# Patient Record
Sex: Female | Born: 1985 | Race: Black or African American | Hispanic: No | Marital: Married | Smoking: Never smoker
Health system: Southern US, Community
[De-identification: ages and names within clinical notes are randomized; demographics above are authoritative.]

## PROBLEM LIST (undated history)

## (undated) ENCOUNTER — Inpatient Hospital Stay (HOSPITAL_COMMUNITY): Payer: Self-pay

## (undated) ENCOUNTER — Inpatient Hospital Stay: Payer: Self-pay

## (undated) DIAGNOSIS — J45909 Unspecified asthma, uncomplicated: Secondary | ICD-10-CM

## (undated) DIAGNOSIS — F32A Depression, unspecified: Secondary | ICD-10-CM

## (undated) DIAGNOSIS — R519 Headache, unspecified: Secondary | ICD-10-CM

## (undated) DIAGNOSIS — G43909 Migraine, unspecified, not intractable, without status migrainosus: Secondary | ICD-10-CM

## (undated) DIAGNOSIS — O24419 Gestational diabetes mellitus in pregnancy, unspecified control: Secondary | ICD-10-CM

## (undated) DIAGNOSIS — D649 Anemia, unspecified: Secondary | ICD-10-CM

## (undated) DIAGNOSIS — R51 Headache: Secondary | ICD-10-CM

## (undated) DIAGNOSIS — Z98891 History of uterine scar from previous surgery: Secondary | ICD-10-CM

## (undated) DIAGNOSIS — F329 Major depressive disorder, single episode, unspecified: Secondary | ICD-10-CM

## (undated) HISTORY — DX: Unspecified asthma, uncomplicated: J45.909

## (undated) HISTORY — PX: DILATION AND CURETTAGE OF UTERUS: SHX78

## (undated) HISTORY — DX: Migraine, unspecified, not intractable, without status migrainosus: G43.909

## (undated) HISTORY — DX: Gestational diabetes mellitus in pregnancy, unspecified control: O24.419

## (undated) HISTORY — DX: History of uterine scar from previous surgery: Z98.891

---

## 2007-12-22 DIAGNOSIS — IMO0001 Reserved for inherently not codable concepts without codable children: Secondary | ICD-10-CM

## 2015-07-16 ENCOUNTER — Other Ambulatory Visit (HOSPITAL_COMMUNITY): Payer: Self-pay | Admitting: *Deleted

## 2015-07-16 DIAGNOSIS — N644 Mastodynia: Secondary | ICD-10-CM

## 2015-07-25 ENCOUNTER — Telehealth (HOSPITAL_COMMUNITY): Payer: Self-pay | Admitting: *Deleted

## 2015-07-25 NOTE — Telephone Encounter (Signed)
Telephoned patient at home # to remind of [facility] appointment. Voicemail box not set up.

## 2015-07-26 ENCOUNTER — Other Ambulatory Visit: Payer: Self-pay

## 2015-07-26 ENCOUNTER — Ambulatory Visit (HOSPITAL_COMMUNITY): Payer: Self-pay

## 2015-08-01 ENCOUNTER — Emergency Department
Admission: EM | Admit: 2015-08-01 | Discharge: 2015-08-01 | Disposition: A | Discharge status: Home / Self Care | Payer: Self-pay | Attending: Emergency Medicine | Admitting: Emergency Medicine

## 2015-08-01 ENCOUNTER — Emergency Department: Payer: Self-pay

## 2015-08-01 ENCOUNTER — Encounter: Payer: Self-pay | Admitting: *Deleted

## 2015-08-01 DIAGNOSIS — Z3490 Encounter for supervision of normal pregnancy, unspecified, unspecified trimester: Secondary | ICD-10-CM

## 2015-08-01 DIAGNOSIS — Z331 Pregnant state, incidental: Secondary | ICD-10-CM | POA: Insufficient documentation

## 2015-08-01 DIAGNOSIS — F329 Major depressive disorder, single episode, unspecified: Secondary | ICD-10-CM | POA: Insufficient documentation

## 2015-08-01 DIAGNOSIS — R1011 Right upper quadrant pain: Secondary | ICD-10-CM | POA: Insufficient documentation

## 2015-08-01 HISTORY — DX: Major depressive disorder, single episode, unspecified: F32.9

## 2015-08-01 HISTORY — DX: Depression, unspecified: F32.A

## 2015-08-01 LAB — URINALYSIS COMPLETE WITH MICROSCOPIC (ARMC ONLY)
Bacteria, UA: NONE SEEN
Bilirubin Urine: NEGATIVE
GLUCOSE, UA: NEGATIVE mg/dL
Hgb urine dipstick: NEGATIVE
KETONES UR: NEGATIVE mg/dL
Leukocytes, UA: NEGATIVE
Nitrite: NEGATIVE
PROTEIN: NEGATIVE mg/dL
Specific Gravity, Urine: 1.009 (ref 1.005–1.030)
pH: 6 (ref 5.0–8.0)

## 2015-08-01 LAB — COMPREHENSIVE METABOLIC PANEL
ALBUMIN: 4.4 g/dL (ref 3.5–5.0)
ALK PHOS: 37 U/L — AB (ref 38–126)
ALT: 23 U/L (ref 14–54)
AST: 25 U/L (ref 15–41)
Anion gap: 8 (ref 5–15)
BUN: 10 mg/dL (ref 6–20)
CALCIUM: 9.3 mg/dL (ref 8.9–10.3)
CHLORIDE: 104 mmol/L (ref 101–111)
CO2: 21 mmol/L — AB (ref 22–32)
CREATININE: 0.78 mg/dL (ref 0.44–1.00)
GFR calc Af Amer: >60 mL/min (ref >60)
GFR calc non Af Amer: >60 mL/min (ref >60)
GLUCOSE: 88 mg/dL (ref 65–99)
Potassium: 4.1 mmol/L (ref 3.5–5.1)
SODIUM: 133 mmol/L — AB (ref 135–145)
Total Bilirubin: 0.9 mg/dL (ref 0.3–1.2)
Total Protein: 7.5 g/dL (ref 6.5–8.1)

## 2015-08-01 LAB — CBC
HCT: 41.3 % (ref 35.0–47.0)
Hemoglobin: 14.4 g/dL (ref 12.0–16.0)
MCH: 29.1 pg (ref 26.0–34.0)
MCHC: 34.7 g/dL (ref 32.0–36.0)
MCV: 83.8 fL (ref 80.0–100.0)
PLATELETS: 204 10*3/uL (ref 150–440)
RBC: 4.93 MIL/uL (ref 3.80–5.20)
RDW: 13.9 % (ref 11.5–14.5)
WBC: 7.7 10*3/uL (ref 3.6–11.0)

## 2015-08-01 LAB — LIPASE, BLOOD: LIPASE: 16 U/L (ref 11–51)

## 2015-08-01 MED ORDER — DIPHENHYDRAMINE HCL 50 MG/ML IJ SOLN
50.0000 mg | Freq: Once | INTRAMUSCULAR | Status: AC
  Administered 2015-08-01: 50 mg via INTRAVENOUS
  Filled 2015-08-01: qty 1

## 2015-08-01 MED ORDER — PRENATAL VITAMINS 0.8 MG PO TABS: 1.0000 | ORAL_TABLET | Freq: Every day | ORAL | Status: DC

## 2015-08-01 MED ORDER — METOCLOPRAMIDE HCL 5 MG/ML IJ SOLN
10.0000 mg | Freq: Once | INTRAMUSCULAR | Status: AC
  Administered 2015-08-01: 10 mg via INTRAVENOUS
  Filled 2015-08-01 (×2): qty 2

## 2015-08-01 MED ORDER — RANITIDINE HCL 150 MG PO CAPS: 150.0000 mg | ORAL_CAPSULE | Freq: Two times a day (BID) | ORAL | Status: DC

## 2015-08-01 MED ORDER — DEXTROSE 5 % AND 0.9 % NACL IV BOLUS
1000.0000 mL | Freq: Once | INTRAVENOUS | Status: AC
  Administered 2015-08-01: 1000 mL via INTRAVENOUS
  Filled 2015-08-01: qty 1000

## 2015-08-01 MED ORDER — FAMOTIDINE IN NACL 20-0.9 MG/50ML-% IV SOLN
20.0000 mg | Freq: Once | INTRAVENOUS | Status: AC
  Administered 2015-08-01: 20 mg via INTRAVENOUS
  Filled 2015-08-01: qty 50

## 2015-08-01 MED ORDER — METOCLOPRAMIDE HCL 10 MG PO TABS: 10.0000 mg | ORAL_TABLET | Freq: Three times a day (TID) | ORAL | Status: DC

## 2015-08-01 NOTE — ED Provider Notes (Signed)
[facility] Emergency Department Provider Note  ____________________________________________  Time seen: 4:30 PM  I have reviewed the triage vital signs and the nursing notes.   HISTORY  Chief Complaint Emesis and Dizziness    HPI Janet [name] is a 30 y.o. female who complains of vomiting for the past 3 days. She's also been having generalized abdominal achiness over the past month which is waxing and waning nonradiating. No aggravating or alleviating factors. She also notes that she's had dizziness described as vertiginous symptoms worse with standing for the last 3 weeks. She said decreased oral intake over the last month as well. I asked menstrual period was June 1 but she also notes that that was only spotting for 3 days which is much lighter than usual and that her last normal menstrual period was the beginning of May.Pain is currently moderate in intensity, achy.     Past Medical History  Diagnosis Date  . Depression      There are no active problems to display for this patient.    History reviewed. No pertinent past surgical history.   Current Outpatient Rx  Name  Route  Sig  Dispense  Refill  . metoCLOPramide (REGLAN) 10 MG tablet   Oral   Take 1 tablet (10 mg total) by mouth 4 (four) times daily -  before meals and at bedtime.   60 tablet   0   . ranitidine (ZANTAC) 150 MG capsule   Oral   Take 1 capsule (150 mg total) by mouth 2 (two) times daily.   28 capsule   0    None  Allergies Levaquin and Nsaids   History reviewed. No pertinent family history.  Social History Social History  Substance Use Topics  . Smoking status: Unknown If Ever Smoked  . Smokeless tobacco: None  . Alcohol Use: None    Review of Systems  Constitutional:   No fever or chills.  ENT:   No sore throat. No rhinorrhea. Cardiovascular:   No chest pain. Respiratory:   No dyspnea or cough. Gastrointestinal:   Poss abdominal pain with vomiting. No  constipation or diarrhea..  Genitourinary:   Negative for dysuria or difficulty urinating. Musculoskeletal:   Negative for focal pain or swelling Neurological:   Negative for headaches. Positive for dizziness 10-point ROS otherwise negative.  ____________________________________________   PHYSICAL EXAM:  VITAL SIGNS: ED Triage Vitals  Enc Vitals Group     BP 08/01/15 1434 127/81 mmHg     Pulse Rate 08/01/15 1434 79     Resp 08/01/15 1434 18     Temp 08/01/15 1434 98.9 F (37.2 C)     Temp Source 08/01/15 1434 Oral     SpO2 08/01/15 1434 100 %     Weight 08/01/15 1434 210 lb (95.255 kg)     Height 08/01/15 1434 5\' 10"  (1.778 m)     Head Cir --      Peak Flow --      Pain Score 08/01/15 1435 8     Pain Loc --      Pain Edu? --      Excl. in GC? --     Vital signs reviewed, nursing assessments reviewed.   Constitutional:   Alert and oriented. Well appearing and in no distress. Eyes:   No scleral icterus. No conjunctival pallor. PERRL. EOMI.  No nystagmus. ENT   Head:   Normocephalic and atraumatic.   Nose:   No congestion/rhinnorhea. No septal hematoma  Mouth/Throat:   MMM, no pharyngeal erythema. No peritonsillar mass.    Neck:   No stridor. No SubQ emphysema. No meningismus. Hematological/Lymphatic/Immunilogical:   No cervical lymphadenopathy. Cardiovascular:   RRR. Symmetric bilateral radial and DP pulses.  No murmurs.  Respiratory:   Normal respiratory effort without tachypnea nor retractions. Breath sounds are clear and equal bilaterally. No wheezes/rales/rhonchi. Gastrointestinal:   Soft with diffuse upper abdominal tenderness. Non distended. There is no CVA tenderness.  No rebound, rigidity, or guarding. Genitourinary:   deferred Musculoskeletal:   Nontender with normal range of motion in all extremities. No joint effusions.  No lower extremity tenderness.  No edema. Neurologic:   Normal speech and language.  CN 2-10 normal. Motor grossly intact. No  gross focal neurologic deficits are appreciated.  Skin:    Skin is warm, dry and intact. No rash noted.  No petechiae, purpura, or bullae.  ____________________________________________    LABS (pertinent positives/negatives) (all labs ordered are listed, but only abnormal results are displayed) Labs Reviewed  COMPREHENSIVE METABOLIC PANEL - Abnormal; Notable for the following:    Sodium 133 (*)    CO2 21 (*)    Alkaline Phosphatase 37 (*)    All other components within normal limits  URINALYSIS COMPLETEWITH MICROSCOPIC ([facility] ONLY) - Abnormal; Notable for the following:    Color, Urine YELLOW (*)    APPearance CLEAR (*)    Squamous Epithelial / LPF 0-5 (*)    All other components within normal limits  LIPASE, BLOOD  CBC  POC URINE PREG, ED   ____________________________________________   EKG  Interpreted by me  Date: 08/01/2015  Rate: 63  Rhythm: normal sinus rhythm  QRS Axis: normal  Intervals: normal  ST/T Wave abnormalities: normal  Conduction Disutrbances: none  Narrative Interpretation: unremarkable      ____________________________________________    RADIOLOGY Ultrasound right upper quadrant unremarkable CT head unremarkable  ____________________________________________   PROCEDURES   ____________________________________________   INITIAL IMPRESSION / ASSESSMENT AND PLAN / ED COURSE  Pertinent labs & imaging results that were available during my care of the patient were reviewed by me and considered in my medical decision making (see chart for details).  Patient not in any distress but uncomfortable appearing. Labs are unremarkable. Urinalysis clean but urine pregnancy test is positive. By dates the patient is either 7 weeks 0 days or possibly up to 11 weeks. No vaginal bleeding, normal vital signs, no indication for an emergent ultrasound here in the emergency department. I discussed this with the patient and advised that she follow-up with  obstetrics and start taking prenatal vitamins. Usual prenatal counseling. Because of the 3 weeks of constant oozing is, we will proceed with CT scan of the brain. Risks and benefits were discussed with her. Likely this is due to some dehydration from poor oral intake in the setting of morning sickness.   ----------------------------------------- 8:45 PM on 08/01/2015 -----------------------------------------  Workup negative. With IV fluids and rest, patient is feeling much better. Symptoms have essentially resolved, tolerating oral intake. Vital signs stable will discharge home, given follow-up information for OB/GYN or health department, which ever the patient is able to arrange. Advised to take prenatal vitamins.    ____________________________________________   FINAL CLINICAL IMPRESSION(S) / ED DIAGNOSES  Final diagnoses:  Pregnancy       Portions of this note were generated with dragon dictation software. Dictation errors may occur despite best attempts at proofreading.   Sharman Cheek, MD 08/01/15 2045

## 2015-08-01 NOTE — ED Notes (Signed)
Pt taken to bathroom via stretcher.

## 2015-08-01 NOTE — ED Notes (Signed)
Dr. Stafford at bedside.  

## 2015-08-01 NOTE — ED Notes (Signed)
Pt stated she has to urinate, and wanted to urinate before going over D/C paperwork. Ambulatory to toilet with steady gait and no assistance.

## 2015-08-01 NOTE — ED Notes (Signed)
States vomiting and dizziness with upper abd pain for 3 weeks, pt states she has not been sleeping, pt awake and alert

## 2015-08-01 NOTE — Discharge Instructions (Signed)
First Trimester of Pregnancy °The first trimester of pregnancy is from week 1 until the end of week 12 (months 1 through 3). A week after a sperm fertilizes an egg, the egg will implant on the wall of the uterus. This embryo will begin to develop into a baby. Genes from you and your partner are forming the baby. The female genes determine whether the baby is a boy or a girl. At 6-8 weeks, the eyes and face are formed, and the heartbeat can be seen on ultrasound. At the end of 12 weeks, all the baby's organs are formed.  °Now that you are pregnant, you will want to do everything you can to have a healthy baby. Two of the most important things are to get good prenatal care and to follow your health care provider's instructions. Prenatal care is all the medical care you receive before the baby's birth. This care will help prevent, find, and treat any problems during the pregnancy and childbirth. °BODY CHANGES °Your body goes through many changes during pregnancy. The changes vary from woman to woman.  °· You may gain or lose a couple of pounds at first. °· You may feel sick to your stomach (nauseous) and throw up (vomit). If the vomiting is uncontrollable, call your health care provider. °· You may tire easily. °· You may develop headaches that can be relieved by medicines approved by your health care provider. °· You may urinate more often. Painful urination may mean you have a bladder infection. °· You may develop heartburn as a result of your pregnancy. °· You may develop constipation because certain hormones are causing the muscles that push waste through your intestines to slow down. °· You may develop hemorrhoids or swollen, bulging veins (varicose veins). °· Your breasts may begin to grow larger and become tender. Your nipples may stick out more, and the tissue that surrounds them (areola) may become darker. °· Your gums may bleed and may be sensitive to brushing and flossing. °· Dark spots or blotches (chloasma,  mask of pregnancy) may develop on your face. This will likely fade after the baby is born. °· Your menstrual periods will stop. °· You may have a loss of appetite. °· You may develop cravings for certain kinds of food. °· You may have changes in your emotions from day to day, such as being excited to be pregnant or being concerned that something may go wrong with the pregnancy and baby. °· You may have more vivid and strange dreams. °· You may have changes in your hair. These can include thickening of your hair, rapid growth, and changes in texture. Some women also have hair loss during or after pregnancy, or hair that feels dry or thin. Your hair will most likely return to normal after your baby is born. °WHAT TO EXPECT AT YOUR PRENATAL VISITS °During a routine prenatal visit: °· You will be weighed to make sure you and the baby are growing normally. °· Your blood pressure will be taken. °· Your abdomen will be measured to track your baby's growth. °· The fetal heartbeat will be listened to starting around week 10 or 12 of your pregnancy. °· Test results from any previous visits will be discussed. °Your health care provider may ask you: °· How you are feeling. °· If you are feeling the baby move. °· If you have had any abnormal symptoms, such as leaking fluid, bleeding, severe headaches, or abdominal cramping. °· If you are using any tobacco products,   including cigarettes, chewing tobacco, and electronic cigarettes. °· If you have any questions. °Other tests that may be performed during your first trimester include: °· Blood tests to find your blood type and to check for the presence of any previous infections. They will also be used to check for low iron levels (anemia) and Rh antibodies. Later in the pregnancy, blood tests for diabetes will be done along with other tests if problems develop. °· Urine tests to check for infections, diabetes, or protein in the urine. °· An ultrasound to confirm the proper growth  and development of the baby. °· An amniocentesis to check for possible genetic problems. °· Fetal screens for spina bifida and Down syndrome. °· You may need other tests to make sure you and the baby are doing well. °· HIV (human immunodeficiency virus) testing. Routine prenatal testing includes screening for HIV, unless you choose not to have this test. °HOME CARE INSTRUCTIONS  °Medicines °· Follow your health care provider's instructions regarding medicine use. Specific medicines may be either safe or unsafe to take during pregnancy. °· Take your prenatal vitamins as directed. °· If you develop constipation, try taking a stool softener if your health care provider approves. °Diet °· Eat regular, well-balanced meals. Choose a variety of foods, such as meat or vegetable-based protein, fish, milk and low-fat dairy products, vegetables, fruits, and whole grain breads and cereals. Your health care provider will help you determine the amount of weight gain that is right for you. °· Avoid raw meat and uncooked cheese. These carry germs that can cause birth defects in the baby. °· Eating four or five small meals rather than three large meals a day may help relieve nausea and vomiting. If you start to feel nauseous, eating a few soda crackers can be helpful. Drinking liquids between meals instead of during meals also seems to help nausea and vomiting. °· If you develop constipation, eat more high-fiber foods, such as fresh vegetables or fruit and whole grains. Drink enough fluids to keep your urine clear or pale yellow. °Activity and Exercise °· Exercise only as directed by your health care provider. Exercising will help you: °¨ Control your weight. °¨ Stay in shape. °¨ Be prepared for labor and delivery. °· Experiencing pain or cramping in the lower abdomen or low back is a good sign that you should stop exercising. Check with your health care provider before continuing normal exercises. °· Try to avoid standing for long  periods of time. Move your legs often if you must stand in one place for a long time. °· Avoid heavy lifting. °· Wear low-heeled shoes, and practice good posture. °· You may continue to have sex unless your health care provider directs you otherwise. °Relief of Pain or Discomfort °· Wear a good support bra for breast tenderness.   °· Take warm sitz baths to soothe any pain or discomfort caused by hemorrhoids. Use hemorrhoid cream if your health care provider approves.   °· Rest with your legs elevated if you have leg cramps or low back pain. °· If you develop varicose veins in your legs, wear support hose. Elevate your feet for 15 minutes, 3-4 times a day. Limit salt in your diet. °Prenatal Care °· Schedule your prenatal visits by the twelfth week of pregnancy. They are usually scheduled monthly at first, then more often in the last 2 months before delivery. °· Write down your questions. Take them to your prenatal visits. °· Keep all your prenatal visits as directed by your   health care provider. °Safety °· Wear your seat belt at all times when driving. °· Make a list of emergency phone numbers, including numbers for family, friends, the hospital, and police and fire departments. °General Tips °· Ask your health care provider for a referral to a local prenatal education class. Begin classes no later than at the beginning of month 6 of your pregnancy. °· Ask for help if you have counseling or nutritional needs during pregnancy. Your health care provider can offer advice or refer you to specialists for help with various needs. °· Do not use hot tubs, steam rooms, or saunas. °· Do not douche or use tampons or scented sanitary pads. °· Do not cross your legs for long periods of time. °· Avoid cat litter boxes and soil used by cats. These carry germs that can cause birth defects in the baby and possibly loss of the fetus by miscarriage or stillbirth. °· Avoid all smoking, herbs, alcohol, and medicines not prescribed by  your health care provider. Chemicals in these affect the formation and growth of the baby. °· Do not use any tobacco products, including cigarettes, chewing tobacco, and electronic cigarettes. If you need help quitting, ask your health care provider. You may receive counseling support and other resources to help you quit. °· Schedule a dentist appointment. At home, brush your teeth with a soft toothbrush and be gentle when you floss. °SEEK MEDICAL CARE IF:  °· You have dizziness. °· You have mild pelvic cramps, pelvic pressure, or nagging pain in the abdominal area. °· You have persistent nausea, vomiting, or diarrhea. °· You have a bad smelling vaginal discharge. °· You have pain with urination. °· You notice increased swelling in your face, hands, legs, or ankles. °SEEK IMMEDIATE MEDICAL CARE IF:  °· You have a fever. °· You are leaking fluid from your vagina. °· You have spotting or bleeding from your vagina. °· You have severe abdominal cramping or pain. °· You have rapid weight gain or loss. °· You vomit blood or material that looks like coffee grounds. °· You are exposed to German measles and have never had them. °· You are exposed to fifth disease or chickenpox. °· You develop a severe headache. °· You have shortness of breath. °· You have any kind of trauma, such as from a fall or a car accident. °  °This information is not intended to replace advice given to you by your health care provider. Make sure you discuss any questions you have with your health care provider. °  °Document Released: 12/24/2000 Document Revised: 01/20/2014 Document Reviewed: 11/09/2012 °Elsevier Interactive Patient Education ©2016 [employer] ° °Eating Plan for Pregnant Women °While you are pregnant, your body will require additional nutrition to help support your growing baby. It is recommended that you consume: °· 150 additional calories each day during your first trimester. °· 300 additional calories each day during your second  trimester. °· 300 additional calories each day during your third trimester. °Eating a healthy, well-balanced diet is very important for your health and for your baby's health. You also have a higher need for some vitamins and minerals, such as folic acid, calcium, iron, and vitamin D. °WHAT DO I NEED TO KNOW ABOUT EATING DURING PREGNANCY? °· Do not try to lose weight or go on a diet during pregnancy. °· Choose healthy, nutritious foods. Choose ½ of a sandwich with a glass of milk instead of a candy bar or a high-calorie sugar-sweetened beverage. °· Limit your overall   intake of foods that have "empty calories." These are foods that have little nutritional value, such as sweets, desserts, candies, sugar-sweetened beverages, and fried foods. °· Eat a variety of foods, especially fruits and vegetables. °· Take a prenatal vitamin to help meet the additional needs during pregnancy, specifically for folic acid, iron, calcium, and vitamin D. °· Remember to stay active. Ask your health care provider for exercise recommendations that are specific to you. °· Practice good food safety and cleanliness, such as washing your hands before you eat and after you prepare raw meat. This helps to prevent foodborne illnesses, such as listeriosis, that can be very dangerous for your baby. Ask your health care provider for more information about listeriosis. °WHAT DOES 150 EXTRA CALORIES LOOK LIKE? °Healthy options for an additional 150 calories each day could be any of the following: °· Plain low-fat yogurt (6-8 oz) with ½ cup of berries. °· 1 apple with 2 teaspoons of peanut butter. °· Cut-up vegetables with ¼ cup of hummus. °· Low-fat chocolate milk (8 oz or 1 cup). °· 1 string cheese with 1 medium orange. °· ½ of a peanut butter and jelly sandwich on whole-wheat bread (1 tsp of peanut butter). °For 300 calories, you could eat two of those healthy options each day.  °WHAT IS A HEALTHY AMOUNT OF WEIGHT TO GAIN? °The recommended amount of  weight for you to gain is based on your pre-pregnancy BMI. If your pre-pregnancy BMI was: °· Less than 18 (underweight), you should gain 28-40 lb. °· 18-24.9 (normal), you should gain 25-35 lb. °· 25-29.9 (overweight), you should gain 15-25 lb. °· Greater than 30 (obese), you should gain 11-20 lb. °WHAT IF I AM HAVING TWINS OR MULTIPLES? °Generally, pregnant women who will be having twins or multiples may need to increase their daily calories by 300-600 calories each day. The recommended range for total weight gain is 25-54 lb, depending on your pre-pregnancy BMI. Talk with your health care provider for specific guidance about additional nutritional needs, weight gain, and exercise during your pregnancy. °WHAT FOODS CAN I EAT? °Grains °Any grains. Try to choose whole grains, such as whole-wheat bread, oatmeal, or brown rice. °Vegetables °Any vegetables. Try to eat a variety of colors and types of vegetables to get a full range of vitamins and minerals. Remember to wash your vegetables well before eating. °Fruits °Any fruits. Try to eat a variety of colors and types of fruit to get a full range of vitamins and minerals. Remember to wash your fruits well before eating. °Meats and Other Protein Sources °Lean meats, including chicken, turkey, fish, and lean cuts of beef, veal, or pork. Make sure that all meats are cooked to "well done." Tofu. Tempeh. Beans. Eggs. Peanut butter and other nut butters. Seafood, such as shrimp, crab, and lobster. If you choose fish, select types that are higher in omega-3 fatty acids, including salmon, herring, mussels, trout, sardines, and pollock. Make sure that all meats are cooked to food-safe temperatures. °Dairy °Pasteurized milk and milk alternatives. Pasteurized yogurt and pasteurized cheese. Cottage cheese. Sour cream. °Beverages °Water. Juices that contain 100% fruit juice or vegetable juice. Caffeine-free teas and decaffeinated coffee. Drinks that contain caffeine are okay to  drink, but it is better to avoid caffeine. Keep your total caffeine intake to less than 200 mg each day (12 oz of coffee, tea, or soda) or as directed by your health care provider. °Condiments °Any pasteurized condiments. °Sweets and Desserts °Any sweets and desserts. °Fats and Oils °  Any fats and oils. °The items listed above may not be a complete list of recommended foods or beverages. Contact your dietitian for more options. °WHAT FOODS ARE NOT RECOMMENDED? °Vegetables °Unpasteurized (raw) vegetable juices. °Fruits °Unpasteurized (raw) fruit juices. °Meats and Other Protein Sources °Cured meats that have nitrates, such as bacon, salami, and hotdogs. Luncheon meats, bologna, or other deli meats (unless they are reheated until they are steaming hot). Refrigerated pate, meat spreads from a meat counter, smoked seafood that is found in the refrigerated section of a store. Raw fish, such as sushi or sashimi. High mercury content fish, such as tilefish, shark, swordfish, and king mackerel. Raw meats, such as tuna or beef tartare. Undercooked meats and poultry. Make sure that all meats are cooked to food-safe temperatures. °Dairy °Unpasteurized (raw) milk and any foods that have raw milk in them. Soft cheeses, such as feta, queso blanco, queso fresco, Brie, Camembert cheeses, blue-veined cheeses, and Panela cheese (unless it is made with pasteurized milk, which must be stated on the label). °Beverages °Alcohol. Sugar-sweetened beverages, such as sodas, teas, or energy drinks. °Condiments °Homemade fermented foods and drinks, such as pickles, sauerkraut, or kombucha drinks. (Store-bought pasteurized versions of these are okay.) °Other °Salads that are made in the store, such as ham salad, chicken salad, egg salad, tuna salad, and seafood salad. °The items listed above may not be a complete list of foods and beverages to avoid. Contact your dietitian for more information. °  °This information is not intended to replace  advice given to you by your health care provider. Make sure you discuss any questions you have with your health care provider. °  °Document Released: 10/14/2013 Document Reviewed: 10/14/2013 °Elsevier Interactive Patient Education ©2016 [employer] ° °

## 2015-08-01 NOTE — ED Notes (Signed)
Bedside poct positive preg test

## 2015-08-01 NOTE — ED Notes (Signed)
Pt asking for something to drink, OK by Dr. Scotty CourtStafford. Pt requesting something hot, this RN suggested tea, pt verbalized that would be ok.

## 2015-08-01 NOTE — ED Notes (Signed)
Pt transferred to CT via stretcher.  

## 2015-08-01 NOTE — ED Notes (Signed)
Vomiting x3 days , discomfort and  dizziness x3 weeks, abd pain(ache) x1 month,

## 2015-08-14 ENCOUNTER — Inpatient Hospital Stay (HOSPITAL_COMMUNITY)
Admission: AD | Admit: 2015-08-14 | Discharge: 2015-08-14 | Disposition: A | Discharge status: Home / Self Care | Payer: Self-pay | Source: Ambulatory Visit | Attending: Obstetrics & Gynecology | Admitting: Obstetrics & Gynecology

## 2015-08-14 ENCOUNTER — Encounter (HOSPITAL_COMMUNITY): Payer: Self-pay | Admitting: *Deleted

## 2015-08-14 ENCOUNTER — Inpatient Hospital Stay (HOSPITAL_COMMUNITY): Payer: Self-pay

## 2015-08-14 DIAGNOSIS — Z3A09 9 weeks gestation of pregnancy: Secondary | ICD-10-CM | POA: Insufficient documentation

## 2015-08-14 DIAGNOSIS — O26891 Other specified pregnancy related conditions, first trimester: Secondary | ICD-10-CM | POA: Insufficient documentation

## 2015-08-14 DIAGNOSIS — O99351 Diseases of the nervous system complicating pregnancy, first trimester: Secondary | ICD-10-CM | POA: Insufficient documentation

## 2015-08-14 DIAGNOSIS — Z886 Allergy status to analgesic agent status: Secondary | ICD-10-CM | POA: Insufficient documentation

## 2015-08-14 DIAGNOSIS — K219 Gastro-esophageal reflux disease without esophagitis: Secondary | ICD-10-CM | POA: Insufficient documentation

## 2015-08-14 DIAGNOSIS — F329 Major depressive disorder, single episode, unspecified: Secondary | ICD-10-CM | POA: Insufficient documentation

## 2015-08-14 DIAGNOSIS — Z881 Allergy status to other antibiotic agents status: Secondary | ICD-10-CM | POA: Insufficient documentation

## 2015-08-14 DIAGNOSIS — O99341 Other mental disorders complicating pregnancy, first trimester: Secondary | ICD-10-CM | POA: Insufficient documentation

## 2015-08-14 DIAGNOSIS — R51 Headache: Secondary | ICD-10-CM | POA: Insufficient documentation

## 2015-08-14 DIAGNOSIS — Z3491 Encounter for supervision of normal pregnancy, unspecified, first trimester: Secondary | ICD-10-CM

## 2015-08-14 DIAGNOSIS — Z79899 Other long term (current) drug therapy: Secondary | ICD-10-CM | POA: Insufficient documentation

## 2015-08-14 DIAGNOSIS — O99611 Diseases of the digestive system complicating pregnancy, first trimester: Secondary | ICD-10-CM | POA: Insufficient documentation

## 2015-08-14 DIAGNOSIS — G4700 Insomnia, unspecified: Secondary | ICD-10-CM | POA: Insufficient documentation

## 2015-08-14 DIAGNOSIS — O209 Hemorrhage in early pregnancy, unspecified: Secondary | ICD-10-CM

## 2015-08-14 DIAGNOSIS — O208 Other hemorrhage in early pregnancy: Secondary | ICD-10-CM | POA: Insufficient documentation

## 2015-08-14 LAB — HCG, QUANTITATIVE, PREGNANCY: hCG, Beta Chain, Quant, S: 146036 m[IU]/mL — ABNORMAL HIGH (ref <5)

## 2015-08-14 LAB — CBC
HEMATOCRIT: 36.4 % (ref 36.0–46.0)
HEMOGLOBIN: 13 g/dL (ref 12.0–15.0)
MCH: 28.6 pg (ref 26.0–34.0)
MCHC: 35.7 g/dL (ref 30.0–36.0)
MCV: 80.2 fL (ref 78.0–100.0)
Platelets: 238 10*3/uL (ref 150–400)
RBC: 4.54 MIL/uL (ref 3.87–5.11)
RDW: 13.6 % (ref 11.5–15.5)
WBC: 7.2 10*3/uL (ref 4.0–10.5)

## 2015-08-14 LAB — COMPREHENSIVE METABOLIC PANEL
ALBUMIN: 4 g/dL (ref 3.5–5.0)
ALT: 27 U/L (ref 14–54)
AST: 24 U/L (ref 15–41)
Alkaline Phosphatase: 43 U/L (ref 38–126)
Anion gap: 6 (ref 5–15)
BILIRUBIN TOTAL: 0.6 mg/dL (ref 0.3–1.2)
BUN: 12 mg/dL (ref 6–20)
CO2: 24 mmol/L (ref 22–32)
CREATININE: 0.78 mg/dL (ref 0.44–1.00)
Calcium: 9.5 mg/dL (ref 8.9–10.3)
Chloride: 104 mmol/L (ref 101–111)
GFR calc Af Amer: >60 mL/min (ref >60)
GLUCOSE: 92 mg/dL (ref 65–99)
Potassium: 4.1 mmol/L (ref 3.5–5.1)
Sodium: 134 mmol/L — ABNORMAL LOW (ref 135–145)
TOTAL PROTEIN: 6.7 g/dL (ref 6.5–8.1)

## 2015-08-14 LAB — WET PREP, GENITAL
SPERM: NONE SEEN
TRICH WET PREP: NONE SEEN
WBC, Wet Prep HPF POC: NONE SEEN
Yeast Wet Prep HPF POC: NONE SEEN

## 2015-08-14 LAB — URINALYSIS, ROUTINE W REFLEX MICROSCOPIC
Bilirubin Urine: NEGATIVE
GLUCOSE, UA: NEGATIVE mg/dL
HGB URINE DIPSTICK: NEGATIVE
Ketones, ur: NEGATIVE mg/dL
Leukocytes, UA: NEGATIVE
Nitrite: NEGATIVE
PH: 6 (ref 5.0–8.0)
PROTEIN: NEGATIVE mg/dL
SPECIFIC GRAVITY, URINE: 1.02 (ref 1.005–1.030)

## 2015-08-14 LAB — LIPASE, BLOOD: Lipase: 15 U/L (ref 11–51)

## 2015-08-14 LAB — POCT PREGNANCY, URINE: Preg Test, Ur: POSITIVE — AB

## 2015-08-14 LAB — AMYLASE: AMYLASE: 65 U/L (ref 28–100)

## 2015-08-14 LAB — ABO/RH: ABO/RH(D): O POS

## 2015-08-14 MED ORDER — FAMOTIDINE 20 MG PO TABS
10.0000 mg | ORAL_TABLET | Freq: Every day | ORAL | Status: DC
  Administered 2015-08-14: 10 mg via ORAL
  Filled 2015-08-14: qty 1

## 2015-08-14 MED ORDER — FAMOTIDINE 10 MG PO TABS: 10.0000 mg | ORAL_TABLET | Freq: Every day | ORAL | 1 refills | Status: DC

## 2015-08-14 NOTE — Discharge Instructions (Signed)
Food Choices for Gastroesophageal Reflux Disease, Adult °When you have gastroesophageal reflux disease (GERD), the foods you eat and your eating habits are very important. Choosing the right foods can help ease the discomfort of GERD. °WHAT GENERAL GUIDELINES DO I NEED TO FOLLOW? °· Choose fruits, vegetables, whole grains, low-fat dairy products, and low-fat meat, fish, and poultry. °· Limit fats such as oils, salad dressings, butter, nuts, and avocado. °· Keep a food diary to identify foods that cause symptoms. °· Avoid foods that cause reflux. These may be different for different people. °· Eat frequent small meals instead of three large meals each day. °· Eat your meals slowly, in a relaxed setting. °· Limit fried foods. °· Cook foods using methods other than frying. °· Avoid drinking alcohol. °· Avoid drinking large amounts of liquids with your meals. °· Avoid bending over or lying down until 2-3 hours after eating. °WHAT FOODS ARE NOT RECOMMENDED? °The following are some foods and drinks that may worsen your symptoms: °Vegetables °Tomatoes. Tomato juice. Tomato and spaghetti sauce. Chili peppers. Onion and garlic. Horseradish. °Fruits °Oranges, grapefruit, and lemon (fruit and juice). °Meats °High-fat meats, fish, and poultry. This includes hot dogs, ribs, ham, sausage, salami, and bacon. °Dairy °Whole milk and chocolate milk. Sour cream. Cream. Butter. Ice cream. Cream cheese.  °Beverages °Coffee and tea, with or without caffeine. Carbonated beverages or energy drinks. °Condiments °Hot sauce. Barbecue sauce.  °Sweets/Desserts °Chocolate and cocoa. Donuts. Peppermint and spearmint. °Fats and Oils °High-fat foods, including French fries and potato chips. °Other °Vinegar. Strong spices, such as black pepper, white pepper, red pepper, cayenne, curry powder, cloves, ginger, and chili powder. °The items listed above may not be a complete list of foods and beverages to avoid. Contact your dietitian for more  information. °  °This information is not intended to replace advice given to you by your health care provider. Make sure you discuss any questions you have with your health care provider. °  °Document Released: 12/30/2004 Document Revised: 01/20/2014 Document Reviewed: 11/03/2012 °Elsevier Interactive Patient Education ©2016 [employer] ° °Gastroesophageal Reflux Disease, Adult °Normally, food travels down the esophagus and stays in the stomach to be digested. However, when a person has gastroesophageal reflux disease (GERD), food and stomach acid move back up into the esophagus. When this happens, the esophagus becomes sore and inflamed. Over time, GERD can create small holes (ulcers) in the lining of the esophagus.  °CAUSES °This condition is caused by a problem with the muscle between the esophagus and the stomach (lower esophageal sphincter, or LES). Normally, the LES muscle closes after food passes through the esophagus to the stomach. When the LES is weakened or abnormal, it does not close properly, and that allows food and stomach acid to go back up into the esophagus. The LES can be weakened by certain dietary substances, medicines, and medical conditions, including: °· Tobacco use. °· Pregnancy. °· Having a hiatal hernia. °· Heavy alcohol use. °· Certain foods and beverages, such as coffee, chocolate, onions, and peppermint. °RISK FACTORS °This condition is more likely to develop in: °· People who have an increased body weight. °· People who have connective tissue disorders. °· People who use NSAID medicines. °SYMPTOMS °Symptoms of this condition include: °· Heartburn. °· Difficult or painful swallowing. °· The feeling of having a lump in the throat. °· A bitter taste in the mouth. °· Bad breath. °· Having a large amount of saliva. °· Having an upset or bloated stomach. °· Belching. °· Chest pain. °·   Shortness of breath or wheezing. °· Ongoing (chronic) cough or a night-time cough. °· Wearing away of  tooth enamel. °· Weight loss. °Different conditions can cause chest pain. Make sure to see your health care provider if you experience chest pain. °DIAGNOSIS °Your health care provider will take a medical history and perform a physical exam. To determine if you have mild or severe GERD, your health care provider may also monitor how you respond to treatment. You may also have other tests, including: °· An endoscopy to examine your stomach and esophagus with a small camera. °· A test that measures the acidity level in your esophagus. °· A test that measures how much pressure is on your esophagus. °· A barium swallow or modified barium swallow to show the shape, size, and functioning of your esophagus. °TREATMENT °The goal of treatment is to help relieve your symptoms and to prevent complications. Treatment for this condition may vary depending on how severe your symptoms are. Your health care provider may recommend: °· Changes to your diet. °· Medicine. °· Surgery. °HOME CARE INSTRUCTIONS °Diet °· Follow a diet as recommended by your health care provider. This may involve avoiding foods and drinks such as: °¨ Coffee and tea (with or without caffeine). °¨ Drinks that contain alcohol. °¨ Energy drinks and sports drinks. °¨ Carbonated drinks or sodas. °¨ Chocolate and cocoa. °¨ Peppermint and mint flavorings. °¨ Garlic and onions. °¨ Horseradish. °¨ Spicy and acidic foods, including peppers, chili powder, curry powder, vinegar, hot sauces, and barbecue sauce. °¨ Citrus fruit juices and citrus fruits, such as oranges, lemons, and limes. °¨ Tomato-based foods, such as red sauce, chili, salsa, and pizza with red sauce. °¨ Fried and fatty foods, such as donuts, french fries, potato chips, and high-fat dressings. °¨ High-fat meats, such as hot dogs and fatty cuts of red and white meats, such as rib eye steak, sausage, ham, and bacon. °¨ High-fat dairy items, such as whole milk, butter, and cream cheese. °· Eat small,  frequent meals instead of large meals. °· Avoid drinking large amounts of liquid with your meals. °· Avoid eating meals during the 2-3 hours before bedtime. °· Avoid lying down right after you eat. °· Do not exercise right after you eat. ° General Instructions  °· Pay attention to any changes in your symptoms. °· Take over-the-counter and prescription medicines only as told by your health care provider. Do not take aspirin, ibuprofen, or other NSAIDs unless your health care provider told you to do so. °· Do not use any tobacco products, including cigarettes, chewing tobacco, and e-cigarettes. If you need help quitting, ask your health care provider. °· Wear loose-fitting clothing. Do not wear anything tight around your waist that causes pressure on your abdomen. °· Raise (elevate) the head of your bed 6 inches (15cm). °· Try to reduce your stress, such as with yoga or meditation. If you need help reducing stress, ask your health care provider. °· If you are overweight, reduce your weight to an amount that is healthy for you. Ask your health care provider for guidance about a safe weight loss goal. °· Keep all follow-up visits as told by your health care provider. This is important. °SEEK MEDICAL CARE IF: °· You have new symptoms. °· You have unexplained weight loss. °· You have difficulty swallowing, or it hurts to swallow. °· You have wheezing or a persistent cough. °· Your symptoms do not improve with treatment. °· You have a hoarse voice. °SEEK IMMEDIATE MEDICAL CARE IF: °· You have pain   in your arms, neck, jaw, teeth, or back. °· You feel sweaty, dizzy, or light-headed. °· You have chest pain or shortness of breath. °· You vomit and your vomit looks like blood or coffee grounds. °· You faint. °· Your stool is bloody or black. °· You cannot swallow, drink, or eat. °  °This information is not intended to replace advice given to you by your health care provider. Make sure you discuss any questions you have with  your health care provider. °  °Document Released: 10/09/2004 Document Revised: 09/20/2014 Document Reviewed: 04/26/2014 °Elsevier Interactive Patient Education ©2016 [employer]vier Inc. ° °

## 2015-08-14 NOTE — MAU Provider Note (Signed)
History     CSN: [id number]  Arrival date and time: 08/14/15 1622   First Provider Initiated Contact with Patient 08/14/15 1659      Chief Complaint  Patient presents with  . Vaginal Bleeding  . Nausea  . Abdominal Pain   G2P1002 @[redacted]w[redacted]d  by LMP c/o upper abdominal pain and heartburn x1 month. She reports occ N/V. She reports fever and sweats at times. She also c/o pink-brown spotting x1 episode last week and again today. She also c/o occasional HA's although she was afraid to use OTC med d/t safety. She was evaluated in ED with neg head CT 2 wks ago. She reports insomnia and feeling restless at night. She is also concerned because she doesn't know how far along she is.    OB History    Gravida Para Term Preterm AB Living   2 1 1          SAB TAB Ectopic Multiple Live Births           2      Past Medical History:  Diagnosis Date  . Depression     Past Surgical History:  Procedure Laterality Date  . CESAREAN SECTION      History reviewed. No pertinent family history.  Social History  Substance Use Topics  . Smoking status: Never Smoker  . Smokeless tobacco: Never Used  . Alcohol use No    Allergies:  Allergies  Allergen Reactions  . Nsaids Swelling    Facial swelling and temporary blindness   . Levaquin [Levofloxacin In D5w] Rash    Prescriptions Prior to Admission  Medication Sig Dispense Refill Last Dose  . metoCLOPramide (REGLAN) 10 MG tablet Take 1 tablet (10 mg total) by mouth 4 (four) times daily -  before meals and at bedtime. 60 tablet 0   . Prenatal Multivit-Min-Fe-FA (PRENATAL VITAMINS) 0.8 MG tablet Take 1 tablet by mouth daily. 90 tablet 3   . ranitidine (ZANTAC) 150 MG capsule Take 1 capsule (150 mg total) by mouth 2 (two) times daily. 28 capsule 0     Review of Systems  Constitutional: Positive for chills and fever.  Respiratory: Negative.   Cardiovascular: Negative.   Gastrointestinal: Positive for abdominal pain, constipation, heartburn,  nausea and vomiting. Negative for diarrhea.  Genitourinary: Negative.   Neurological: Dizziness: occasional.   Physical Exam   Blood pressure 117/72, pulse 77, temperature 98 F (36.7 C), temperature source Oral, resp. rate 16, last menstrual period 06/09/2015, SpO2 100 %.  Physical Exam  Constitutional: She is oriented to person, place, and time. She appears well-developed and well-nourished.  HENT:  Head: Normocephalic and atraumatic.  Neck: Normal range of motion. Neck supple.  Cardiovascular: Normal rate.   Respiratory: Effort normal.  GI: Soft. She exhibits no distension and no mass. There is tenderness (above umbilicus). There is no rebound and no guarding.  Genitourinary:  Genitourinary Comments: External: no lesions Vagina: rugated, parous cervix, thick white discharge Uterus: enlarged, retroverted, non-tender, No CMT Adnexae: no masses, no tenderness left, no tenderness right   Musculoskeletal: Normal range of motion.  Neurological: She is alert and oriented to person, place, and time.  Skin: Skin is warm and dry.  Psychiatric: She has a normal mood and affect.   Results for orders placed or performed during the hospital encounter of 08/14/15 (from the past 24 hour(s))  Urinalysis, Routine w reflex microscopic (not at [facility])     Status: None   Collection Time: 08/14/15  4:29 PM  Result  Value Ref Range   Color, Urine YELLOW YELLOW   APPearance CLEAR CLEAR   Specific Gravity, Urine 1.020 1.005 - 1.030   pH 6.0 5.0 - 8.0   Glucose, UA NEGATIVE NEGATIVE mg/dL   Hgb urine dipstick NEGATIVE NEGATIVE   Bilirubin Urine NEGATIVE NEGATIVE   Ketones, ur NEGATIVE NEGATIVE mg/dL   Protein, ur NEGATIVE NEGATIVE mg/dL   Nitrite NEGATIVE NEGATIVE   Leukocytes, UA NEGATIVE NEGATIVE  Pregnancy, urine POC     Status: Abnormal   Collection Time: 08/14/15  4:39 PM  Result Value Ref Range   Preg Test, Ur POSITIVE (A) NEGATIVE  Wet prep, genital     Status: Abnormal   Collection  Time: 08/14/15  5:10 PM  Result Value Ref Range   Yeast Wet Prep HPF POC NONE SEEN NONE SEEN   Trich, Wet Prep NONE SEEN NONE SEEN   Clue Cells Wet Prep HPF POC PRESENT (A) NONE SEEN   WBC, Wet Prep HPF POC NONE SEEN NONE SEEN   Sperm NONE SEEN   hCG, quantitative, pregnancy     Status: Abnormal   Collection Time: 08/14/15  5:32 PM  Result Value Ref Range   hCG, Beta Chain, Quant, S 146,036 (H) <5 mIU/mL  ABO/Rh     Status: None (Preliminary result)   Collection Time: 08/14/15  5:32 PM  Result Value Ref Range   ABO/RH(D) O POS   CBC     Status: None   Collection Time: 08/14/15  5:32 PM  Result Value Ref Range   WBC 7.2 4.0 - 10.5 K/uL   RBC 4.54 3.87 - 5.11 MIL/uL   Hemoglobin 13.0 12.0 - 15.0 g/dL   HCT 46.8 03.2 - 12.2 %   MCV 80.2 78.0 - 100.0 fL   MCH 28.6 26.0 - 34.0 pg   MCHC 35.7 30.0 - 36.0 g/dL   RDW 48.2 50.0 - 37.0 %   Platelets 238 150 - 400 K/uL  Comprehensive metabolic panel     Status: Abnormal   Collection Time: 08/14/15  5:32 PM  Result Value Ref Range   Sodium 134 (L) 135 - 145 mmol/L   Potassium 4.1 3.5 - 5.1 mmol/L   Chloride 104 101 - 111 mmol/L   CO2 24 22 - 32 mmol/L   Glucose, Bld 92 65 - 99 mg/dL   BUN 12 6 - 20 mg/dL   Creatinine, Ser 4.88 0.44 - 1.00 mg/dL   Calcium 9.5 8.9 - 89.1 mg/dL   Total Protein 6.7 6.5 - 8.1 g/dL   Albumin 4.0 3.5 - 5.0 g/dL   AST 24 15 - 41 U/L   ALT 27 14 - 54 U/L   Alkaline Phosphatase 43 38 - 126 U/L   Total Bilirubin 0.6 0.3 - 1.2 mg/dL   GFR calc non Af Amer >60 >60 mL/min   GFR calc Af Amer >60 >60 mL/min   Anion gap 6 5 - 15  Amylase     Status: None   Collection Time: 08/14/15  5:32 PM  Result Value Ref Range   Amylase 65 28 - 100 U/L  Lipase, blood     Status: None   Collection Time: 08/14/15  5:32 PM  Result Value Ref Range   Lipase 15 11 - 51 U/L   US Ob Comp Less 14 Wks  Result Date: 08/14/2015 CLINICAL DATA:  Vaginal bleeding in early pregnancy. Estimated gestational age per LMP 9 weeks 3 days.  Beta HCG pending. EXAM: OBSTETRIC <14 WK ULTRASOUND;  TRANSVAGINAL OB ULTRASOUND TECHNIQUE: Transvaginal ultrasound was performed for complete evaluation of the gestation as well as the maternal uterus, adnexal regions, and pelvic cul-de-sac. COMPARISON:  None. FINDINGS: Intrauterine gestational sac: Single visualized within normal. Yolk sac:  Visualized. Embryo:  Visualized. Cardiac Activity: Visualized. Heart Rate: 175 bpm CRL:   20.1  mm   8 w 4 d                  [place] EDC: 03/22/2015 Subchorionic hemorrhage: Small subchorionic hemorrhage measuring 1.1 x 1.4 x 1.3 cm. Maternal uterus/adnexae: Ovaries are normal size, shape and position. Small left ovarian corpus luteal cyst. No free pelvic fluid. IMPRESSION: Single live IUP with estimated gestational age [redacted] weeks 4 days. Small subchorionic hemorrhage. Recommend follow-up ultrasound as clinically needed. Electronically Signed   By: Elberta Fortis M.D.   On: 08/14/2015 18:31   US Ob Transvaginal  Result Date: 08/14/2015 CLINICAL DATA:  Vaginal bleeding in early pregnancy. Estimated gestational age per LMP 9 weeks 3 days. Beta HCG pending. EXAM: OBSTETRIC <14 WK ULTRASOUND; TRANSVAGINAL OB ULTRASOUND TECHNIQUE: Transvaginal ultrasound was performed for complete evaluation of the gestation as well as the maternal uterus, adnexal regions, and pelvic cul-de-sac. COMPARISON:  None. FINDINGS: Intrauterine gestational sac: Single visualized within normal. Yolk sac:  Visualized. Embryo:  Visualized. Cardiac Activity: Visualized. Heart Rate: 175 bpm CRL:   20.1  mm   8 w 4 d                  [place] EDC: 03/22/2015 Subchorionic hemorrhage: Small subchorionic hemorrhage measuring 1.1 x 1.4 x 1.3 cm. Maternal uterus/adnexae: Ovaries are normal size, shape and position. Small left ovarian corpus luteal cyst. No free pelvic fluid. IMPRESSION: Single live IUP with estimated gestational age [redacted] weeks 4 days. Small subchorionic hemorrhage. Recommend follow-up ultrasound as clinically  needed. Electronically Signed   By: Elberta Fortis M.D.   On: 08/14/2015 18:31    MAU Course  Procedures  Pepcid 10 mg po x1-some relief after dose. MDM Labs and [place] reviewed. No evidence of acute abdomen. Pain likely d/t GERD. Other sx likely physiologic to pregnancy. Stable for discharge home.  Assessment and Plan   1. Viable pregnancy in first trimester   2. Bleeding in early pregnancy   3. Vaginal bleeding in pregnancy, first trimester   4. Gastroesophageal reflux disease without esophagitis   5. Insomnia   6. Headache in pregnancy, antepartum, first trimester    Discharge home Pepcid 10 mg po daily Unisom tab 1 po prn insomnia Tylenol prn HA Follow up with Ob provider of choice-list provided  Donette Larry, CNM 08/14/2015, 5:16 PM

## 2015-08-14 NOTE — MAU Note (Signed)
Pt states she has been spotting and having upper abdominal pain that sometimes goes to other places.  Pt states she is having headaches and dizziness.  Pt states she has been having fevers for the past few weeks but denies taking her temperature.  Pt states she just knows she had a fever because she was hot and took a warm bath and had chills afterwards.  Pt states she has also been having some nausea.  Pt also states she does not know how far along she is.

## 2015-08-15 LAB — GC/CHLAMYDIA PROBE AMP (~~LOC~~) NOT AT ARMC
Chlamydia: NEGATIVE
Neisseria Gonorrhea: NEGATIVE

## 2016-03-14 ENCOUNTER — Encounter: Payer: Self-pay | Admitting: Emergency Medicine

## 2016-03-14 DIAGNOSIS — R1011 Right upper quadrant pain: Secondary | ICD-10-CM | POA: Insufficient documentation

## 2016-03-14 DIAGNOSIS — Z3A00 Weeks of gestation of pregnancy not specified: Secondary | ICD-10-CM | POA: Diagnosis not present

## 2016-03-14 DIAGNOSIS — O219 Vomiting of pregnancy, unspecified: Secondary | ICD-10-CM | POA: Diagnosis not present

## 2016-03-14 NOTE — ED Triage Notes (Signed)
Pt ambulatory to triage in NAD, report dizziness, nausea, epigastric pain, weakness x 2 weeks, states got worse today.

## 2016-03-15 ENCOUNTER — Encounter: Payer: Self-pay | Admitting: Radiology

## 2016-03-15 ENCOUNTER — Emergency Department: Payer: Medicaid Other

## 2016-03-15 ENCOUNTER — Emergency Department
Admission: EM | Admit: 2016-03-15 | Discharge: 2016-03-15 | Disposition: A | Discharge status: Home / Self Care | Payer: Medicaid Other | Attending: Emergency Medicine | Admitting: Emergency Medicine

## 2016-03-15 DIAGNOSIS — Z3490 Encounter for supervision of normal pregnancy, unspecified, unspecified trimester: Secondary | ICD-10-CM

## 2016-03-15 DIAGNOSIS — R1010 Upper abdominal pain, unspecified: Secondary | ICD-10-CM

## 2016-03-15 LAB — COMPREHENSIVE METABOLIC PANEL
ALBUMIN: 3.7 g/dL (ref 3.5–5.0)
ALK PHOS: 41 U/L (ref 38–126)
ALT: 19 U/L (ref 14–54)
ANION GAP: 7 (ref 5–15)
AST: 24 U/L (ref 15–41)
BUN: 15 mg/dL (ref 6–20)
CALCIUM: 8.8 mg/dL — AB (ref 8.9–10.3)
CO2: 19 mmol/L — AB (ref 22–32)
Chloride: 107 mmol/L (ref 101–111)
Creatinine, Ser: 0.79 mg/dL (ref 0.44–1.00)
GFR calc Af Amer: >60 mL/min (ref >60)
GFR calc non Af Amer: >60 mL/min (ref >60)
GLUCOSE: 102 mg/dL — AB (ref 65–99)
POTASSIUM: 3.6 mmol/L (ref 3.5–5.1)
SODIUM: 133 mmol/L — AB (ref 135–145)
Total Bilirubin: 0.5 mg/dL (ref 0.3–1.2)
Total Protein: 6.5 g/dL (ref 6.5–8.1)

## 2016-03-15 LAB — URINALYSIS, COMPLETE (UACMP) WITH MICROSCOPIC
BACTERIA UA: NONE SEEN
Bilirubin Urine: NEGATIVE
Glucose, UA: NEGATIVE mg/dL
Hgb urine dipstick: NEGATIVE
Ketones, ur: NEGATIVE mg/dL
Leukocytes, UA: NEGATIVE
Nitrite: NEGATIVE
Protein, ur: NEGATIVE mg/dL
SPECIFIC GRAVITY, URINE: 1.014 (ref 1.005–1.030)
pH: 6 (ref 5.0–8.0)

## 2016-03-15 LAB — CBC
HEMATOCRIT: 36.9 % (ref 35.0–47.0)
Hemoglobin: 13.1 g/dL (ref 12.0–16.0)
MCH: 29.4 pg (ref 26.0–34.0)
MCHC: 35.6 g/dL (ref 32.0–36.0)
MCV: 82.7 fL (ref 80.0–100.0)
Platelets: 243 10*3/uL (ref 150–440)
RBC: 4.47 MIL/uL (ref 3.80–5.20)
RDW: 13.9 % (ref 11.5–14.5)
WBC: 8.2 10*3/uL (ref 3.6–11.0)

## 2016-03-15 LAB — HCG, QUANTITATIVE, PREGNANCY: HCG, BETA CHAIN, QUANT, S: 142800 m[IU]/mL — AB (ref <5)

## 2016-03-15 LAB — LIPASE, BLOOD: Lipase: 13 U/L (ref 11–51)

## 2016-03-15 LAB — POCT PREGNANCY, URINE: PREG TEST UR: POSITIVE — AB

## 2016-03-15 MED ORDER — SODIUM CHLORIDE 0.9 % IV BOLUS (SEPSIS)
1000.0000 mL | Freq: Once | INTRAVENOUS | Status: AC
  Administered 2016-03-15: 1000 mL via INTRAVENOUS

## 2016-03-15 MED ORDER — METOCLOPRAMIDE HCL 5 MG/ML IJ SOLN
10.0000 mg | Freq: Once | INTRAMUSCULAR | Status: DC
  Filled 2016-03-15: qty 2

## 2016-03-15 NOTE — ED Provider Notes (Addendum)
[facility] Emergency Department Provider Note  Time seen: 12:34 AM  I have reviewed the triage vital signs and the nursing notes.   HISTORY  Chief Complaint Nausea and Abdominal Pain    HPI Janet [name] is a 31 y.o. female with a past medical history of depression who presents to the emergency department with nausea and vomiting and upper abdominal discomfort. According to the patient for the past 2 weeks she has been feeling intermittent nausea with dizziness and fatigue. She states over the past one week she has been experiencing vomiting intermittently. Denies any diarrhea. She does state mild to moderate upper especially right upper quadrant discomfort over the past one week since she has been vomiting. Denies any diarrhea. States she has been feeling warm at home but denies taking her temperature. Patient states she had twins 9 years ago, was pregnant several months ago but had a miscarriage. Her last period was January 5 although states she had one or 2 days of very mild bleeding in February.  Past Medical History:  Diagnosis Date  . Depression     There are no active problems to display for this patient.   Past Surgical History:  Procedure Laterality Date  . CESAREAN SECTION      Prior to Admission medications   Medication Sig Start Date End Date Taking? Authorizing Provider  famotidine (PEPCID) 10 MG tablet Take 1 tablet (10 mg total) by mouth daily. 08/14/15   Donette Larry, CNM  Prenatal Multivit-Min-Fe-FA (PRENATAL VITAMINS) 0.8 MG tablet Take 1 tablet by mouth daily. 08/01/15   Sharman Cheek, MD    Allergies  Allergen Reactions  . Nsaids Swelling    Facial swelling and temporary blindness   . Levaquin [Levofloxacin In D5w] Rash    History reviewed. No pertinent family history.  Social History Social History  Substance Use Topics  . Smoking status: Never Smoker  . Smokeless tobacco: Never Used  . Alcohol use No    Review of  Systems Constitutional: Negative for fever. Cardiovascular: Negative for chest pain. Respiratory: Negative for shortness of breath. Gastrointestinal: Upper abdominal discomfort. Positive for nausea and vomiting. Negative for diarrhea. Genitourinary: Negative for dysuria. Neurological: Negative for headache 10-point ROS otherwise negative.  ____________________________________________   PHYSICAL EXAM:  VITAL SIGNS: ED Triage Vitals [03/14/16 2335]  Enc Vitals Group     BP 139/74     Pulse Rate 70     Resp 20     Temp 98.2 F (36.8 C)     Temp Source Oral     SpO2 99 %     Weight 230 lb (104.3 kg)     Height 5\' 9"  (1.753 m)     Head Circumference      Peak Flow      Pain Score 9     Pain Loc      Pain Edu?      Excl. in GC?     Constitutional: Alert and oriented. Well appearing and in no distress. Eyes: Normal exam ENT   Head: Normocephalic and atraumatic   Mouth/Throat: Mucous membranes are moist. Cardiovascular: Normal rate, regular rhythm. No murmur Respiratory: Normal respiratory effort without tachypnea nor retractions. Breath sounds are clear  Gastrointestinal: Soft, mild right upper quadrant epigastric tenderness palpation. No rebound or guarding. No distention. No lower abdominal tenderness. Musculoskeletal: Nontender with normal range of motion in all extremities. Neurologic:  Normal speech and language. No gross focal neurologic deficits  Skin:  Skin is warm,  dry and intact.  Psychiatric: Mood and affect are normal.   ____________________________________________   RADIOLOGY  Ultrasound negative  EKG reviewed and interpreted by myself shows normal sinus rhythm at 66 bpm, narrow QRS, normal axis, normal intervals, no CT changes. Normal EKG. ____________________________________________   INITIAL IMPRESSION / ASSESSMENT AND PLAN / ED COURSE  Pertinent labs & imaging results that were available during my care of the patient were reviewed by me and  considered in my medical decision making (see chart for details).  Patient presents the emergency department with nausea and vomiting for the past one week, dizziness and fatigue for 2 weeks. Patient has a positive urine pregnancy test in the emergency department, patient was unaware that she was pregnant. We will treat with IV fluids and Reglan. We will obtain a beta hCG quantitative, and a right upper quadrant ultrasound to further evaluate.  Right upper quadrant ultrasound is negative. Patient's beta hCG is 142,000. I discussed this with the patient the need to follow-up with OB/GYN. Patient agreeable to plan.  ____________________________________________   FINAL CLINICAL IMPRESSION(S) / ED DIAGNOSES  Nausea vomiting Pregnancy Abdominal discomfort    Minna AntisKevin Breanah Faddis, MD 03/15/16 [id number]    Minna AntisKevin Kenderick Kobler, MD 03/15/16 [id number]

## 2016-03-15 NOTE — ED Notes (Signed)
Pt returned from ultrasound. Pt states "the pain is coming and going now, not as bad." additional warm blankets provided for comfort. Call bell at left side. Pt updated on wait for ultrasound results. Pt verbalizes understanding.

## 2016-03-15 NOTE — ED Notes (Signed)
Patient transported to Ultrasound 

## 2016-03-15 NOTE — ED Notes (Signed)
Pt requesting something for pain. md notified and states pt may have tylenol. Pt declines offer for tylenol. Explanation provided to pt regarding positive pregnancy test. Pt verbalizes understanding. Pt states zofran and reglan "make me crazy". md notified, order for discontinuation of reglan received. Pt states anti nausea medicine "made me take off all my clothes, I was jumping out of my skin, I could not sit still, it was awful."

## 2016-03-15 NOTE — Discharge Instructions (Signed)
Please follow-up with OB/GYN as soon as possible. Return to the emergency department for any abdominal pain, or any other symptom personally concerning to yourself.

## 2016-03-15 NOTE — ED Notes (Signed)
RN April reviewed d/c instructions, follow-up care with patient previously. RN Eileen StanfordJenna discussed d/c instructions, follow-up care with patient. Pt verbalized understanding.

## 2016-03-15 NOTE — ED Notes (Signed)
Pt states has had 2 weeks of epigastric to ruq pain with associated nausea and vomiting. Pt states she has had a fever and diarrhea. Pt states tonight the pain "was at it's worst". Pt states she has felt dizzy and weak with associated decreased po intake. Skin normal color warm and dry. No skin tenting noted, moist oral mucus membranes. Pt states " I can't keep anything down".

## 2016-04-05 ENCOUNTER — Inpatient Hospital Stay (HOSPITAL_COMMUNITY): Payer: Medicaid Other

## 2016-04-05 ENCOUNTER — Inpatient Hospital Stay (HOSPITAL_COMMUNITY)
Admission: AD | Admit: 2016-04-05 | Discharge: 2016-04-05 | Disposition: A | Discharge status: Home / Self Care | Payer: Medicaid Other | Source: Ambulatory Visit | Attending: Obstetrics & Gynecology | Admitting: Obstetrics & Gynecology

## 2016-04-05 ENCOUNTER — Encounter (HOSPITAL_COMMUNITY): Payer: Self-pay | Admitting: *Deleted

## 2016-04-05 DIAGNOSIS — Z3A11 11 weeks gestation of pregnancy: Secondary | ICD-10-CM | POA: Insufficient documentation

## 2016-04-05 DIAGNOSIS — O9989 Other specified diseases and conditions complicating pregnancy, childbirth and the puerperium: Secondary | ICD-10-CM | POA: Diagnosis not present

## 2016-04-05 DIAGNOSIS — R1010 Upper abdominal pain, unspecified: Secondary | ICD-10-CM

## 2016-04-05 DIAGNOSIS — O210 Mild hyperemesis gravidarum: Secondary | ICD-10-CM | POA: Diagnosis not present

## 2016-04-05 DIAGNOSIS — R12 Heartburn: Secondary | ICD-10-CM | POA: Diagnosis not present

## 2016-04-05 DIAGNOSIS — Z3491 Encounter for supervision of normal pregnancy, unspecified, first trimester: Secondary | ICD-10-CM

## 2016-04-05 DIAGNOSIS — O219 Vomiting of pregnancy, unspecified: Secondary | ICD-10-CM

## 2016-04-05 DIAGNOSIS — G43019 Migraine without aura, intractable, without status migrainosus: Secondary | ICD-10-CM | POA: Insufficient documentation

## 2016-04-05 DIAGNOSIS — R109 Unspecified abdominal pain: Secondary | ICD-10-CM

## 2016-04-05 DIAGNOSIS — R1030 Lower abdominal pain, unspecified: Secondary | ICD-10-CM

## 2016-04-05 DIAGNOSIS — O26899 Other specified pregnancy related conditions, unspecified trimester: Secondary | ICD-10-CM

## 2016-04-05 DIAGNOSIS — O26891 Other specified pregnancy related conditions, first trimester: Secondary | ICD-10-CM

## 2016-04-05 HISTORY — DX: Headache: R51

## 2016-04-05 HISTORY — DX: Headache, unspecified: R51.9

## 2016-04-05 LAB — CBC
HEMATOCRIT: 32.7 % — AB (ref 36.0–46.0)
Hemoglobin: 11.8 g/dL — ABNORMAL LOW (ref 12.0–15.0)
MCH: 29.3 pg (ref 26.0–34.0)
MCHC: 36.1 g/dL — ABNORMAL HIGH (ref 30.0–36.0)
MCV: 81.1 fL (ref 78.0–100.0)
Platelets: 201 10*3/uL (ref 150–400)
RBC: 4.03 MIL/uL (ref 3.87–5.11)
RDW: 13.5 % (ref 11.5–15.5)
WBC: 7 10*3/uL (ref 4.0–10.5)

## 2016-04-05 LAB — URINALYSIS, ROUTINE W REFLEX MICROSCOPIC
Bilirubin Urine: NEGATIVE
Glucose, UA: NEGATIVE mg/dL
Hgb urine dipstick: NEGATIVE
Ketones, ur: NEGATIVE mg/dL
Leukocytes, UA: NEGATIVE
NITRITE: NEGATIVE
PH: 7 (ref 5.0–8.0)
Protein, ur: NEGATIVE mg/dL
SPECIFIC GRAVITY, URINE: 1.008 (ref 1.005–1.030)

## 2016-04-05 LAB — WET PREP, GENITAL
SPERM: NONE SEEN
Trich, Wet Prep: NONE SEEN
Yeast Wet Prep HPF POC: NONE SEEN

## 2016-04-05 LAB — HCG, QUANTITATIVE, PREGNANCY: hCG, Beta Chain, Quant, S: 111819 m[IU]/mL — ABNORMAL HIGH (ref <5)

## 2016-04-05 LAB — HIV ANTIBODY (ROUTINE TESTING W REFLEX): HIV SCREEN 4TH GENERATION: NONREACTIVE

## 2016-04-05 MED ORDER — FAMOTIDINE 40 MG/5ML PO SUSR
20.0000 mg | Freq: Two times a day (BID) | ORAL | 0 refills | Status: DC
Start: 2016-04-05 — End: 2016-06-27

## 2016-04-05 MED ORDER — GI COCKTAIL ~~LOC~~: 30.0000 mL | ORAL | Status: DC

## 2016-04-05 MED ORDER — ACETAMINOPHEN 500 MG PO TABS
1000.0000 mg | ORAL_TABLET | Freq: Once | ORAL | Status: AC
  Administered 2016-04-05: 1000 mg via ORAL
  Filled 2016-04-05: qty 2

## 2016-04-05 MED ORDER — GI COCKTAIL ~~LOC~~
30.0000 mL | Freq: Once | ORAL | Status: AC
  Administered 2016-04-05: 30 mL via ORAL
  Filled 2016-04-05: qty 30

## 2016-04-05 NOTE — Discharge Instructions (Signed)
[facility]  ° ° °[facility]       Phone: [phone] ° °[facility] Phone: [phone] ° °[facility] [place]  Phone: [phone] ° °[facility] [place]  Phone: [phone] ° °[facility] [place]  Phone: [phone] ° °[facility]       Phone: [phone] ° °[facility]    Phone: [phone]  ° °[facility]    Phone: [phone] ° °[facility]    Phone: [phone] ° °[facility]    Phone: [phone] ° °[facility]    Phone: [phone] ° °[facility]       Phone: [phone]  ° °[facility]  Phone: [phone] ° °[facility]    Phone: [phone] ° °[facility]    Phone: [phone] ° °[facility]      Phone: [phone] ° °[facility]    Phone: [phone] ° °

## 2016-04-05 NOTE — Progress Notes (Signed)
Lisa Leftwich-Kirby CNM in earlier to discuss test results and d/c plan. Written and verbal d/c instructions given and understanding voiced. 

## 2016-04-05 NOTE — MAU Note (Signed)
Having a lot of abd pain in lower and upper abd for a wk and a half. Hard to sleep. Unable to eat much due to pain in upper abd. No vag bleeding or d/c

## 2016-04-05 NOTE — MAU Provider Note (Signed)
Chief Complaint: Abdominal Pain; Heartburn; and Emesis During Pregnancy   First Provider Initiated Contact with Patient 04/05/16 0302      SUBJECTIVE HPI: Janet [name] is a 31 y.o. G3P1000 at [redacted]w[redacted]d by LMP who presents to maternity admissions reporting both upper and lower abdominal pain x 2 weeks.  She reports the upper abdominal pain is constant, but worsens at times, is worsened by certain positions and after eating and is associated with nausea and vomiting.  She does not feel like she is able to keep down much food and only a little bit of fluids each day.  Her lower abdominal pain is intermittent, cramping pain that is not improved or worsened by anything. She was seen previously on 03/15/16 with upper abdominal pain and had normal labs and normal RUQ ultrasound.  She reports the pain is worse She denies vaginal bleeding, vaginal itching/burning, urinary symptoms, h/a, dizziness, n/v, or fever/chills.     HPI  Past Medical History:  Diagnosis Date  . Depression   . Headache    Past Surgical History:  Procedure Laterality Date  . CESAREAN SECTION     Social History   Social History  . Marital status: Single    Spouse name: N/A  . Number of children: N/A  . Years of education: N/A   Occupational History  . Not on file.   Social History Main Topics  . Smoking status: Never Smoker  . Smokeless tobacco: Never Used  . Alcohol use No  . Drug use: No  . Sexual activity: Yes   Other Topics Concern  . Not on file   Social History Narrative  . No narrative on file   No current facility-administered medications on file prior to encounter.    Current Outpatient Prescriptions on File Prior to Encounter  Medication Sig Dispense Refill  . famotidine (PEPCID) 10 MG tablet Take 1 tablet (10 mg total) by mouth daily. 30 tablet 1  . Prenatal Multivit-Min-Fe-FA (PRENATAL VITAMINS) 0.8 MG tablet Take 1 tablet by mouth daily. 90 tablet 3   Allergies  Allergen Reactions  . Nsaids  Swelling    Facial swelling and temporary blindness   . Levaquin [Levofloxacin In D5w] Rash    ROS:  Review of Systems  Constitutional: Negative for chills, fatigue and fever.  Respiratory: Negative for shortness of breath.   Cardiovascular: Negative for chest pain.  Gastrointestinal: Positive for abdominal pain, nausea and vomiting.  Genitourinary: Positive for pelvic pain. Negative for difficulty urinating, dysuria, flank pain, vaginal bleeding, vaginal discharge and vaginal pain.  Neurological: Negative for dizziness and headaches.  Psychiatric/Behavioral: Negative.      I have reviewed patient's Past Medical Hx, Surgical Hx, Family Hx, Social Hx, medications and allergies.   Physical Exam   Patient Vitals for the past 24 hrs:  BP Temp Pulse Resp Height Weight  04/05/16 0514 (!) 100/51 98.2 F (36.8 C) 80 18 - -  04/05/16 0032 106/67 98.6 F (37 C) 74 18 5\' 9"  (1.753 m) 239 lb (108.4 kg)   Constitutional: Well-developed, well-nourished female in mild distress.  Cardiovascular: normal rate Respiratory: normal effort GI: Abd soft, non-tender. Pos BS x 4 MS: Extremities nontender, no edema, normal ROM Neurologic: Alert and oriented x 4.  GU: Neg CVAT.  PELVIC EXAM: Cervix pink, visually closed, without lesion, scant white creamy discharge, vaginal walls and external genitalia normal Bimanual exam: Cervix 0/long/high, firm, anterior, neg CMT, uterus nontender, slightly enlarged, adnexa without tenderness, enlargement, or mass   LAB  RESULTS Results for orders placed or performed during the hospital encounter of 04/05/16 (from the past 24 hour(s))  Urinalysis, Routine w reflex microscopic     Status: Abnormal   Collection Time: 04/05/16 12:40 AM  Result Value Ref Range   Color, Urine STRAW (A) YELLOW   APPearance CLEAR CLEAR   Specific Gravity, Urine 1.008 1.005 - 1.030   pH 7.0 5.0 - 8.0   Glucose, UA NEGATIVE NEGATIVE mg/dL   Hgb urine dipstick NEGATIVE NEGATIVE    Bilirubin Urine NEGATIVE NEGATIVE   Ketones, ur NEGATIVE NEGATIVE mg/dL   Protein, ur NEGATIVE NEGATIVE mg/dL   Nitrite NEGATIVE NEGATIVE   Leukocytes, UA NEGATIVE NEGATIVE  CBC     Status: Abnormal   Collection Time: 04/05/16  3:05 AM  Result Value Ref Range   WBC 7.0 4.0 - 10.5 K/uL   RBC 4.03 3.87 - 5.11 MIL/uL   Hemoglobin 11.8 (L) 12.0 - 15.0 g/dL   HCT 16.132.7 (L) 09.636.0 - 04.546.0 %   MCV 81.1 78.0 - 100.0 fL   MCH 29.3 26.0 - 34.0 pg   MCHC 36.1 (H) 30.0 - 36.0 g/dL   RDW 40.913.5 81.111.5 - 91.415.5 %   Platelets 201 150 - 400 K/uL  hCG, quantitative, pregnancy     Status: Abnormal   Collection Time: 04/05/16  3:05 AM  Result Value Ref Range   hCG, Beta Chain, Quant, S 111,819 (H) <5 mIU/mL  Wet prep, genital     Status: Abnormal   Collection Time: 04/05/16  3:47 AM  Result Value Ref Range   Yeast Wet Prep HPF POC NONE SEEN NONE SEEN   Trich, Wet Prep NONE SEEN NONE SEEN   Clue Cells Wet Prep HPF POC PRESENT (A) NONE SEEN   WBC, Wet Prep HPF POC MODERATE (A) NONE SEEN   Sperm NONE SEEN     --/--/O POS (08/01 1732)  IMAGING [place] Ob Comp Less 14 Wks  Result Date: 04/05/2016 CLINICAL DATA:  Initial evaluation for acute lower and upper abdominal pain for 1.5 weeks. Currently pregnant. EXAM: OBSTETRIC <14 WK ULTRASOUND TECHNIQUE: Transabdominal ultrasound was performed for evaluation of the gestation as well as the maternal uterus and adnexal regions. COMPARISON:  None available. FINDINGS: Intrauterine gestational sac: Single Yolk sac:  Not visualized Embryo:  Present Cardiac Activity: Present Heart Rate: 148 bpm MSD:   mm    w     d CRL:   37.3  mm   10 w 4 d                  US EDC: 10/28/2016 Subchorionic hemorrhage:  None visualized. Maternal uterus/adnexae: Ovaries were normal in appearance. No other acute abnormality within the adnexa. No free fluid. IMPRESSION: 1. Single viable intrauterine pregnancy as above without complication. 2. No other acute abnormality within the pelvis.  Electronically Signed   By: Rise MuBenjamin  McClintock M.D.   On: 04/05/2016 04:52   [place] Abdomen Limited Ruq  Result Date: 03/15/2016 CLINICAL DATA:  Subacute onset of right upper quadrant abdominal pain and vomiting. Initial encounter. EXAM: US ABDOMEN LIMITED - RIGHT UPPER QUADRANT COMPARISON:  Right upper quadrant ultrasound performed 08/01/2015 FINDINGS: Gallbladder: No gallstones or wall thickening visualized. No sonographic Murphy sign noted by sonographer. Common bile duct: Diameter: 0.3 cm, within normal limits in caliber. Liver: No focal lesion identified. Within normal limits in parenchymal echogenicity. IMPRESSION: Unremarkable ultrasound of the right upper quadrant. Electronically Signed   By: Roanna RaiderJeffery  Chang M.D.   On:  03/15/2016 02:14    MAU Management/MDM: Ordered labs and [place] and reviewed results. IUP confirmed by today's [place].  Pt pain is mostly upper abdominal pain which was significantly reduced by GI Cocktail given in MAU. Results reviewed from pt previous visit to [facility] and RUQ [place] and lipase wnl. No evidence of acute process, likely acid reflux that is not well controlled since pt vomits prescribed medication.  Pt requests liquid medication since she always vomits with Pepcid.  PO Tylenol 1000 mg given in MAU for h/a which significantly reduced pain.  D/C home with liquid Pepcid Rx to take BID, Tylenol PRN for h/a, start prenatal care as soon as possible, list provided.  Pt stable at time of discharge.  ASSESSMENT 1. Normal IUP (intrauterine pregnancy) on prenatal ultrasound, first trimester   2. Abdominal pain complicating pregnancy   3. Heartburn during pregnancy in first trimester   4. Nausea and vomiting during pregnancy prior to [redacted] weeks gestation   5. Intractable migraine without aura and without status migrainosus     PLAN Discharge home Allergies as of 04/05/2016      Reactions   Nsaids Swelling   Facial swelling and temporary blindness   Levaquin [levofloxacin In  D5w] Rash      Medication List    STOP taking these medications   famotidine 10 MG tablet Commonly known as:  PEPCID Replaced by:  famotidine 40 MG/5ML suspension     TAKE these medications   famotidine 40 MG/5ML suspension Commonly known as:  PEPCID Take 2.5 mLs (20 mg total) by mouth 2 (two) times daily. Replaces:  famotidine 10 MG tablet   Prenatal Vitamins 0.8 MG tablet Take 1 tablet by mouth daily.      Follow-up Information    Prenatal provider of your choice Follow up.   Why:  See list provided. Return to MAU as needed for emergencies.          Sharen Counter Certified Nurse-Midwife 04/05/2016  6:16 AM

## 2016-04-07 LAB — GC/CHLAMYDIA PROBE AMP (~~LOC~~) NOT AT ARMC
CHLAMYDIA, DNA PROBE: NEGATIVE
NEISSERIA GONORRHEA: NEGATIVE

## 2016-04-24 ENCOUNTER — Ambulatory Visit (INDEPENDENT_AMBULATORY_CARE_PROVIDER_SITE_OTHER): Payer: Medicaid Other | Admitting: Obstetrics and Gynecology

## 2016-04-24 ENCOUNTER — Encounter: Payer: Self-pay | Admitting: Obstetrics and Gynecology

## 2016-04-24 VITALS — BP 117/75 | HR 92 | Wt 231.0 lb

## 2016-04-24 DIAGNOSIS — Z3483 Encounter for supervision of other normal pregnancy, third trimester: Secondary | ICD-10-CM

## 2016-04-24 DIAGNOSIS — Z8759 Personal history of other complications of pregnancy, childbirth and the puerperium: Secondary | ICD-10-CM | POA: Insufficient documentation

## 2016-04-24 DIAGNOSIS — Z98891 History of uterine scar from previous surgery: Secondary | ICD-10-CM | POA: Insufficient documentation

## 2016-04-24 DIAGNOSIS — Z113 Encounter for screening for infections with a predominantly sexual mode of transmission: Secondary | ICD-10-CM | POA: Diagnosis not present

## 2016-04-24 DIAGNOSIS — Z3400 Encounter for supervision of normal first pregnancy, unspecified trimester: Secondary | ICD-10-CM

## 2016-04-24 DIAGNOSIS — O34219 Maternal care for unspecified type scar from previous cesarean delivery: Secondary | ICD-10-CM

## 2016-04-24 DIAGNOSIS — E669 Obesity, unspecified: Secondary | ICD-10-CM

## 2016-04-24 DIAGNOSIS — O99210 Obesity complicating pregnancy, unspecified trimester: Secondary | ICD-10-CM | POA: Insufficient documentation

## 2016-04-24 DIAGNOSIS — Z6837 Body mass index (BMI) 37.0-37.9, adult: Secondary | ICD-10-CM | POA: Insufficient documentation

## 2016-04-24 DIAGNOSIS — Z3493 Encounter for supervision of normal pregnancy, unspecified, third trimester: Secondary | ICD-10-CM

## 2016-04-24 DIAGNOSIS — O99213 Obesity complicating pregnancy, third trimester: Secondary | ICD-10-CM

## 2016-04-24 DIAGNOSIS — O0990 Supervision of high risk pregnancy, unspecified, unspecified trimester: Secondary | ICD-10-CM | POA: Insufficient documentation

## 2016-04-24 HISTORY — DX: History of uterine scar from previous surgery: Z98.891

## 2016-04-24 MED ORDER — ASPIRIN EC 81 MG PO TBEC: 81.0000 mg | DELAYED_RELEASE_TABLET | Freq: Every day | ORAL | 3 refills | Status: DC

## 2016-04-24 NOTE — Progress Notes (Signed)
New OB Note  04/24/2016   [facility]  Chief Complaint: NOB  Transfer of Care Patient: no  History of Present Illness: Ms. Janet [name] is a 31 y.o. [id number] @ 13/6 weeks (EDC 10/12, based on Patient's last menstrual period was 01/18/2016.=10).  Preg complicated by has Supervision of normal first pregnancy, antepartum; History of gestational hypertension; History of cesarean delivery; and Obesity in pregnancy on her problem list.   Any events prior to today's visit: no Her periods were: every month She was using no method when she conceived.  She has Negative signs or symptoms of nausea/vomiting of pregnancy. She has Negative signs or symptoms of miscarriage or preterm labor She identifies Negative Zika risk factors for her and her partner; her and her husband havent been back to [place] in >2 years On any different medications around the time she conceived/early pregnancy: No   ROS: A 12-point review of systems was performed and negative, except as stated in the above HPI.  OBGYN History: As per HPI. OB History  Gravida Para Term Preterm AB Living  SAB TAB Ectopic Multiple Live Births  # Outcome Date GA Lbr Len/2nd Weight Sex Delivery Anes PTL Lv  3 Current           2A Term 12/22/07    M CS-LTranv   LIV     Complications: Twins  2B Term 12/22/07    M CS-LTranv   LIV  1 SAB  [redacted]w[redacted]d             Any issues with any prior pregnancies: yes, twins, malpresentation, HTN Prior children are healthy, doing well, and without any problems or issues: yes History of pap smears: Yes. Last pap smear 2017 and results were negative   Past Medical History: Past Medical History:  Diagnosis Date  . Asthma   . Depression   . Headache     Past Surgical History: Past Surgical History:  Procedure Laterality Date  . CESAREAN SECTION    . DILATION AND CURETTAGE OF UTERUS      Family History:  Family History  Problem Relation Age of Onset   . Hypertension Mother   . Heart disease Mother   . Diabetes Father   . Cancer Maternal Aunt     Breast    She denies any history of mental retardation, birth defects or genetic disorders in her or the FOB's history  Social History:  Social History   Social History  . Marital status: Single    Spouse name: N/A  . Number of children: N/A  . Years of education: N/A   Occupational History  . Not on file.   Social History Main Topics  . Smoking status: Never Smoker  . Smokeless tobacco: Never Used  . Alcohol use No  . Drug use: No  . Sexual activity: Yes    Birth control/ protection: None   Other Topics Concern  . Not on file   Social History Narrative  . No narrative on file    Allergy: Allergies  Allergen Reactions  . Nsaids Swelling    Facial swelling and temporary blindness   . Levaquin [Levofloxacin In D5w] Rash    Health Maintenance:  Mammogram Up to Date: not applicable  Current Outpatient Medications: PNV  Physical Exam:   BP 117/75   Pulse 92   Wt 231 lb (104.8 kg)  LMP 01/18/2016   BMI 34.11 kg/m  Body mass index is 34.11 kg/m. Contractions: Not present Vag. Bleeding: None. Fundal height: not applicable FHTs: 140s  General appearance: Well nourished, well developed female in no acute distress.  Neck:  Supple, normal appearance, and no thyromegaly  Cardiovascular: S1, S2 normal, no murmur, rub or gallop, regular rate and rhythm Respiratory:  Clear to auscultation bilateral. Normal respiratory effort Abdomen: positive bowel sounds and no masses, hernias; diffusely non tender to palpation, non distended Breasts: breasts appear normal, no suspicious masses, no skin or nipple changes or axillary nodes, and normal palpation. Neuro/Psych:  Normal mood and affect.  Skin:  Warm and dry.  Normal pelvic exam on 3/24  Laboratory: 3/24: negatie hiv, wet prep, u/a, cbc, gc/ct  Imaging:  CLINICAL DATA:  Initial evaluation for acute lower and  upper abdominal pain for 1.5 weeks. Currently pregnant.  EXAM: OBSTETRIC <14 WK ULTRASOUND  TECHNIQUE: Transabdominal ultrasound was performed for evaluation of the gestation as well as the maternal uterus and adnexal regions.  COMPARISON:  None available.  FINDINGS: Intrauterine gestational sac: Single  Yolk sac:  Not visualized  Embryo:  Present  Cardiac Activity: Present  Heart Rate: 148 bpm  MSD:   mm    w     d  CRL:   37.3  mm   10 w 4 d                  [place] EDC: 10/28/2016  Subchorionic hemorrhage:  None visualized.  Maternal uterus/adnexae: Ovaries were normal in appearance. No other acute abnormality within the adnexa. No free fluid.  IMPRESSION: 1. Single viable intrauterine pregnancy as above without complication. 2. No other acute abnormality within the pelvis.   Electronically Signed   By: Rise Mu M.D.   On: 04/05/2016 04:52   Assessment: pt doing well  Plan: 1. Encounter for supervision of normal pregnancy in third trimester, unspecified gravidity Routine care. Anatomy scan 18wks. Pt amenable to genetic screening. Pt to get blood drawn at [facility] 2/2 difficult stick.  - Obstetric Panel, Including HIV - Hemoglobin A1c - CMP and Liver - GC/Chlamydia probe amp ([place])not at [facility] - Urine culture - Hepatitis C Antibody - Hemoglobinopathy Evaluation - Cystic fibrosis gene test - SMN1 Copy Number Analysis - [place] MFM OB COMP + 14 WK; Future  2. History of gestational hypertension Has pretty bad rxn to NSAIDs. Will hold off on baby ASA  4. History of cesarean delivery Twins, breech, 50m and pt not told she needs rpt. Pt told she's eligible for TOLAC; she is desiring rpt at this pt. Told her we can re address later in preg  5. Obesity in pregnancy Baseline pre-x labs and early GDM screening.   Problem list reviewed and updated.  Follow up in 2 weeks for quad screen. RTC per baby scripts.   >50% of 25 min visit  spent on counseling and coordination of care.     Cornelia Copa MD Attending [facility])

## 2016-04-25 LAB — GC/CHLAMYDIA PROBE AMP (~~LOC~~) NOT AT ARMC
Chlamydia: NEGATIVE
NEISSERIA GONORRHEA: NEGATIVE

## 2016-04-26 LAB — URINE CULTURE

## 2016-05-02 ENCOUNTER — Other Ambulatory Visit (INDEPENDENT_AMBULATORY_CARE_PROVIDER_SITE_OTHER): Payer: Medicaid Other | Admitting: *Deleted

## 2016-05-02 DIAGNOSIS — Z3482 Encounter for supervision of other normal pregnancy, second trimester: Secondary | ICD-10-CM | POA: Diagnosis not present

## 2016-05-02 NOTE — Progress Notes (Signed)
Pt here for blood draw only  

## 2016-05-13 LAB — OBSTETRIC PANEL, INCLUDING HIV
Antibody Screen: NEGATIVE
Basophils Absolute: 0 10*3/uL (ref 0.0–0.2)
Basos: 0 %
EOS (ABSOLUTE): 0.1 10*3/uL (ref 0.0–0.4)
Eos: 2 %
HEMATOCRIT: 36.6 % (ref 34.0–46.6)
HEP B S AG: NEGATIVE
HIV Screen 4th Generation wRfx: NONREACTIVE
Hemoglobin: 12.2 g/dL (ref 11.1–15.9)
IMMATURE GRANULOCYTES: 0 %
Immature Grans (Abs): 0 10*3/uL (ref 0.0–0.1)
LYMPHS: 34 %
Lymphocytes Absolute: 2.3 10*3/uL (ref 0.7–3.1)
MCH: 28.5 pg (ref 26.6–33.0)
MCHC: 33.3 g/dL (ref 31.5–35.7)
MCV: 86 fL (ref 79–97)
MONOCYTES: 7 %
Monocytes Absolute: 0.5 10*3/uL (ref 0.1–0.9)
Neutrophils Absolute: 3.7 10*3/uL (ref 1.4–7.0)
Neutrophils: 57 %
PLATELETS: 216 10*3/uL (ref 150–379)
RBC: 4.28 x10E6/uL (ref 3.77–5.28)
RDW: 15.1 % (ref 12.3–15.4)
RPR Ser Ql: NONREACTIVE
RUBELLA: 17.2 {index} (ref >0.99)
Rh Factor: POSITIVE
WBC: 6.6 10*3/uL (ref 3.4–10.8)

## 2016-05-13 LAB — SMN1 COPY NUMBER ANALYSIS (SMA CARRIER SCREENING)

## 2016-05-13 LAB — HEMOGLOBINOPATHY EVALUATION
Ferritin: 43 ng/mL (ref 15–150)
HGB SOLUBILITY: POSITIVE — AB
HGB VARIANT: 0 %
Hgb A2 Quant: 3.4 % — ABNORMAL HIGH (ref 1.8–3.2)
Hgb A: 56.3 % — ABNORMAL LOW (ref 96.4–98.8)
Hgb C: 0 %
Hgb F Quant: 0.6 % (ref 0.0–2.0)
Hgb S: 39.7 % — ABNORMAL HIGH

## 2016-05-13 LAB — CYSTIC FIBROSIS GENE TEST

## 2016-05-13 LAB — HEPATITIS C ANTIBODY

## 2016-05-13 LAB — CMP AND LIVER
ALK PHOS: 39 IU/L (ref 39–117)
ALT: 16 IU/L (ref 0–32)
AST: 18 IU/L (ref 0–40)
Albumin: 4.2 g/dL (ref 3.5–5.5)
BUN: 7 mg/dL (ref 6–20)
Bilirubin Total: 0.4 mg/dL (ref 0.0–1.2)
Bilirubin, Direct: 0.14 mg/dL (ref 0.00–0.40)
CALCIUM: 9.1 mg/dL (ref 8.7–10.2)
CO2: 19 mmol/L (ref 18–29)
CREATININE: 0.59 mg/dL (ref 0.57–1.00)
Chloride: 104 mmol/L (ref 96–106)
GFR calc non Af Amer: 123 mL/min/{1.73_m2} (ref >59)
GFR, EST AFRICAN AMERICAN: 141 mL/min/{1.73_m2} (ref >59)
Glucose: 98 mg/dL (ref 65–99)
Potassium: 4.1 mmol/L (ref 3.5–5.2)
SODIUM: 137 mmol/L (ref 134–144)
TOTAL PROTEIN: 6.4 g/dL (ref 6.0–8.5)

## 2016-05-13 LAB — HEMOGLOBIN A1C
ESTIMATED AVERAGE GLUCOSE: 97 mg/dL
HEMOGLOBIN A1C: 5 % (ref 4.8–5.6)

## 2016-05-13 LAB — PLEASE NOTE

## 2016-05-15 ENCOUNTER — Encounter: Payer: Self-pay | Admitting: Obstetrics and Gynecology

## 2016-05-15 DIAGNOSIS — D573 Sickle-cell trait: Secondary | ICD-10-CM | POA: Insufficient documentation

## 2016-05-28 ENCOUNTER — Ambulatory Visit (HOSPITAL_COMMUNITY)
Admission: RE | Admit: 2016-05-28 | Discharge: 2016-05-28 | Disposition: A | Discharge status: Home / Self Care | Payer: Medicaid Other | Source: Ambulatory Visit | Attending: Obstetrics and Gynecology | Admitting: Obstetrics and Gynecology

## 2016-05-28 DIAGNOSIS — Z3A18 18 weeks gestation of pregnancy: Secondary | ICD-10-CM | POA: Insufficient documentation

## 2016-05-28 DIAGNOSIS — Z3492 Encounter for supervision of normal pregnancy, unspecified, second trimester: Secondary | ICD-10-CM | POA: Diagnosis present

## 2016-05-28 DIAGNOSIS — Z3493 Encounter for supervision of normal pregnancy, unspecified, third trimester: Secondary | ICD-10-CM

## 2016-06-12 ENCOUNTER — Ambulatory Visit (INDEPENDENT_AMBULATORY_CARE_PROVIDER_SITE_OTHER): Payer: Medicaid Other | Admitting: Obstetrics and Gynecology

## 2016-06-12 VITALS — BP 113/70 | HR 93 | Wt 236.0 lb

## 2016-06-12 DIAGNOSIS — Z3400 Encounter for supervision of normal first pregnancy, unspecified trimester: Secondary | ICD-10-CM

## 2016-06-12 DIAGNOSIS — O99210 Obesity complicating pregnancy, unspecified trimester: Secondary | ICD-10-CM

## 2016-06-12 DIAGNOSIS — Z3482 Encounter for supervision of other normal pregnancy, second trimester: Secondary | ICD-10-CM

## 2016-06-12 DIAGNOSIS — Z8759 Personal history of other complications of pregnancy, childbirth and the puerperium: Secondary | ICD-10-CM

## 2016-06-12 DIAGNOSIS — D573 Sickle-cell trait: Secondary | ICD-10-CM

## 2016-06-12 DIAGNOSIS — O34219 Maternal care for unspecified type scar from previous cesarean delivery: Secondary | ICD-10-CM

## 2016-06-12 DIAGNOSIS — O99212 Obesity complicating pregnancy, second trimester: Secondary | ICD-10-CM

## 2016-06-12 DIAGNOSIS — G43909 Migraine, unspecified, not intractable, without status migrainosus: Secondary | ICD-10-CM | POA: Insufficient documentation

## 2016-06-12 DIAGNOSIS — E669 Obesity, unspecified: Secondary | ICD-10-CM

## 2016-06-12 DIAGNOSIS — Z98891 History of uterine scar from previous surgery: Secondary | ICD-10-CM

## 2016-06-12 DIAGNOSIS — G43809 Other migraine, not intractable, without status migrainosus: Secondary | ICD-10-CM

## 2016-06-12 DIAGNOSIS — O99012 Anemia complicating pregnancy, second trimester: Secondary | ICD-10-CM

## 2016-06-12 MED ORDER — BUTALBITAL-APAP-CAFFEINE 50-325-40 MG PO CAPS: ORAL_CAPSULE | ORAL | 0 refills | Status: DC

## 2016-06-12 MED ORDER — METRONIDAZOLE 500 MG PO TABS: 500.0000 mg | ORAL_TABLET | Freq: Two times a day (BID) | ORAL | 0 refills | Status: DC

## 2016-06-12 NOTE — Progress Notes (Addendum)
Prenatal Visit Note Date: 06/12/2016 [facility]  Subjective:  Janet EdingerSylvia [name] is a 31 y.o. [id number] at 10612w6d being seen today for ongoing prenatal care.  She is currently monitored for the following issues for this low-risk pregnancy and has Supervision of normal first pregnancy, antepartum; History of gestational hypertension; History of cesarean delivery; Obesity in pregnancy; Sickle cell trait (HCC); and Migraine on her problem list.  Patient reports migraines 2-3x/week. Vaginal d/c Contractions: Not present. Vag. Bleeding: None.  Movement: Present. Denies leaking of fluid.   The following portions of the patient's history were reviewed and updated as appropriate: allergies, current medications, past family history, past medical history, past social history, past surgical history and problem list. Problem list updated.  Objective:   Vitals:   06/12/16 1106  BP: 113/70  Pulse: 93  Weight: 236 lb (107 kg)    Fetal Status: Fetal Heart Rate (bpm): 153   Movement: Present     General:  Alert, oriented and cooperative. Patient is in no acute distress.  Skin: Skin is warm and dry. No rash noted.   Cardiovascular: Normal heart rate noted  Respiratory: Normal respiratory effort, no problems with respiration noted  Abdomen: Soft, gravid, appropriate for gestational age. Pain/Pressure: Present     Pelvic:  Cervical exam deferred        Extremities: Normal range of motion.  Edema: None  Mental Status: Normal mood and affect. Normal behavior. Normal judgment and thought content.   Urinalysis:      Assessment and Plan:  Pregnancy: G3P1012 at 3212w6d  1. Supervision of normal first pregnancy, antepartum Completion anatomy scan in one month. Flagyl for BV. Had +wet prep in march but no tx.  - US MFM OB FOLLOW UP; Future  2. Sickle cell trait (HCC) D/w pt and FOB implications. He'll see about getting tested  3. Obesity in pregnancy Baseline pc ratio today -  Protein / creatinine ratio, urine  4. Other migraine without status migrainosus, not intractable Used sumatriptans before outside of pregnancy. To try and set up appt with karen here. D/w her and her husband to try fioricet 2-3x/week at the most to see if that helps and use the sumatriptan if that doesn't help. Category C nature d/w them.   Preterm labor symptoms and general obstetric precautions including but not limited to vaginal bleeding, contractions, leaking of fluid and fetal movement were reviewed in detail with the patient. Please refer to After Visit Summary for other counseling recommendations.  Return in about 2 weeks (around 06/26/2016) for 2-3wk rob.   [place] BingPickens, Mauria Asquith, MD

## 2016-06-13 LAB — PROTEIN / CREATININE RATIO, URINE
CREATININE, UR: 205.2 mg/dL
Protein, Ur: 22.2 mg/dL
Protein/Creat Ratio: 108 mg/g creat (ref 0–200)

## 2016-06-27 ENCOUNTER — Ambulatory Visit (INDEPENDENT_AMBULATORY_CARE_PROVIDER_SITE_OTHER): Payer: Medicaid Other | Admitting: Obstetrics and Gynecology

## 2016-06-27 ENCOUNTER — Encounter: Payer: Self-pay | Admitting: Obstetrics and Gynecology

## 2016-06-27 VITALS — BP 102/68 | HR 87 | Wt 239.0 lb

## 2016-06-27 DIAGNOSIS — O34219 Maternal care for unspecified type scar from previous cesarean delivery: Secondary | ICD-10-CM

## 2016-06-27 DIAGNOSIS — Z3400 Encounter for supervision of normal first pregnancy, unspecified trimester: Secondary | ICD-10-CM

## 2016-06-27 DIAGNOSIS — E669 Obesity, unspecified: Secondary | ICD-10-CM

## 2016-06-27 DIAGNOSIS — O99210 Obesity complicating pregnancy, unspecified trimester: Secondary | ICD-10-CM

## 2016-06-27 DIAGNOSIS — Z8759 Personal history of other complications of pregnancy, childbirth and the puerperium: Secondary | ICD-10-CM

## 2016-06-27 DIAGNOSIS — O99012 Anemia complicating pregnancy, second trimester: Secondary | ICD-10-CM

## 2016-06-27 DIAGNOSIS — D573 Sickle-cell trait: Secondary | ICD-10-CM

## 2016-06-27 DIAGNOSIS — Z98891 History of uterine scar from previous surgery: Secondary | ICD-10-CM

## 2016-06-27 DIAGNOSIS — O99212 Obesity complicating pregnancy, second trimester: Secondary | ICD-10-CM

## 2016-06-27 MED ORDER — FAMOTIDINE 40 MG/5ML PO SUSR: 20.0000 mg | Freq: Two times a day (BID) | ORAL | 2 refills | Status: DC

## 2016-06-27 MED ORDER — TINIDAZOLE 500 MG PO TABS: 2.0000 g | ORAL_TABLET | Freq: Every day | ORAL | 2 refills | Status: DC

## 2016-06-27 NOTE — Progress Notes (Signed)
Prenatal Visit Note Date: 06/27/2016 [facility]  Subjective:  Gwynne EdingerSylvia Lipford is a 31 y.o. [id number] at 6256w0d being seen today for ongoing prenatal care.  She is currently monitored for the following issues for this low-risk pregnancy and has Supervision of normal first pregnancy, antepartum; History of gestational hypertension; History of cesarean delivery; Obesity in pregnancy; Sickle cell trait (HCC); Migraine; and Obesity, Class II, BMI 35-39.9 on her problem list.  Patient reports no complaints.   Contractions: Not present. Vag. Bleeding: None.  Movement: Present. Denies leaking of fluid.   The following portions of the patient's history were reviewed and updated as appropriate: allergies, current medications, past family history, past medical history, past social history, past surgical history and problem list. Problem list updated.  Objective:   Vitals:   06/27/16 1034  BP: 102/68  Pulse: 87  Weight: 239 lb (108.4 kg)    Fetal Status: Fetal Heart Rate (bpm): 151   Movement: Present     General:  Alert, oriented and cooperative. Patient is in no acute distress.  Skin: Skin is warm and dry. No rash noted.   Cardiovascular: Normal heart rate noted  Respiratory: Normal respiratory effort, no problems with respiration noted  Abdomen: Soft, gravid, appropriate for gestational age. Pain/Pressure: Present     Pelvic:  Cervical exam deferred        Extremities: Normal range of motion.  Edema: None  Mental Status: Normal mood and affect. Normal behavior. Normal judgment and thought content.   Urinalysis:      Assessment and Plan:  Pregnancy: G3P1012 at 5356w0d  1. Supervision of normal first pregnancy, antepartum Routine care. 28wk labs nv. pepcid sent in. Pt didn't take flagyl b/c she states it causes itching on skin, no sob. D/w her don't have to tx bv in pregnancy and can try metrogel but pt would like some type of po tx. Will try tinidazole.   2. Sickle  cell trait (HCC) ucx nv  3. Obesity in pregnancy See above  4. History of cesarean delivery Pt desires rpt  5. History of gestational hypertension Will hold off on asa given reaction to nsaids  Preterm labor symptoms and general obstetric precautions including but not limited to vaginal bleeding, contractions, leaking of fluid and fetal movement were reviewed in detail with the patient. Please refer to After Visit Summary for other counseling recommendations.  Return in about 1 month (around 07/27/2016) for rob and 2hr GTT.   [place] BingPickens, Shanielle Correll, MD

## 2016-06-27 NOTE — Patient Instructions (Signed)
We will not do the aspirin because of your reaction to NSAIDs

## 2016-07-10 ENCOUNTER — Ambulatory Visit (HOSPITAL_COMMUNITY)
Admission: RE | Admit: 2016-07-10 | Discharge: 2016-07-10 | Disposition: A | Discharge status: Home / Self Care | Payer: Medicaid Other | Source: Ambulatory Visit | Attending: Obstetrics and Gynecology | Admitting: Obstetrics and Gynecology

## 2016-07-10 ENCOUNTER — Other Ambulatory Visit: Payer: Self-pay | Admitting: Obstetrics and Gynecology

## 2016-07-10 DIAGNOSIS — Z98891 History of uterine scar from previous surgery: Secondary | ICD-10-CM

## 2016-07-10 DIAGNOSIS — O99212 Obesity complicating pregnancy, second trimester: Secondary | ICD-10-CM

## 2016-07-10 DIAGNOSIS — Z362 Encounter for other antenatal screening follow-up: Secondary | ICD-10-CM | POA: Diagnosis not present

## 2016-07-10 DIAGNOSIS — IMO0002 Reserved for concepts with insufficient information to code with codable children: Secondary | ICD-10-CM

## 2016-07-10 DIAGNOSIS — Z3A24 24 weeks gestation of pregnancy: Secondary | ICD-10-CM | POA: Insufficient documentation

## 2016-07-10 DIAGNOSIS — Z0489 Encounter for examination and observation for other specified reasons: Secondary | ICD-10-CM

## 2016-07-10 DIAGNOSIS — O34219 Maternal care for unspecified type scar from previous cesarean delivery: Secondary | ICD-10-CM | POA: Diagnosis not present

## 2016-07-10 DIAGNOSIS — O09292 Supervision of pregnancy with other poor reproductive or obstetric history, second trimester: Secondary | ICD-10-CM | POA: Insufficient documentation

## 2016-07-10 DIAGNOSIS — Z3400 Encounter for supervision of normal first pregnancy, unspecified trimester: Secondary | ICD-10-CM

## 2016-07-10 DIAGNOSIS — Z862 Personal history of diseases of the blood and blood-forming organs and certain disorders involving the immune mechanism: Secondary | ICD-10-CM | POA: Insufficient documentation

## 2016-07-18 ENCOUNTER — Observation Stay
Admit: 2016-07-18 | Discharge: 2016-07-18 | Disposition: A | Discharge status: Home / Self Care | Payer: Medicaid Other | Attending: Obstetrics & Gynecology | Admitting: Obstetrics & Gynecology

## 2016-07-18 ENCOUNTER — Emergency Department
Admission: EM | Admit: 2016-07-18 | Discharge: 2016-07-18 | Disposition: A | Discharge status: Home / Self Care | Payer: Medicaid Other | Source: Home / Self Care | Attending: Emergency Medicine | Admitting: Emergency Medicine

## 2016-07-18 ENCOUNTER — Encounter: Payer: Self-pay | Admitting: *Deleted

## 2016-07-18 ENCOUNTER — Emergency Department: Payer: Medicaid Other

## 2016-07-18 DIAGNOSIS — O99512 Diseases of the respiratory system complicating pregnancy, second trimester: Secondary | ICD-10-CM | POA: Insufficient documentation

## 2016-07-18 DIAGNOSIS — Z3A27 27 weeks gestation of pregnancy: Secondary | ICD-10-CM

## 2016-07-18 DIAGNOSIS — J45909 Unspecified asthma, uncomplicated: Secondary | ICD-10-CM | POA: Insufficient documentation

## 2016-07-18 DIAGNOSIS — Z3A26 26 weeks gestation of pregnancy: Secondary | ICD-10-CM | POA: Diagnosis not present

## 2016-07-18 DIAGNOSIS — F329 Major depressive disorder, single episode, unspecified: Secondary | ICD-10-CM | POA: Diagnosis not present

## 2016-07-18 DIAGNOSIS — R0600 Dyspnea, unspecified: Secondary | ICD-10-CM | POA: Insufficient documentation

## 2016-07-18 DIAGNOSIS — Z3490 Encounter for supervision of normal pregnancy, unspecified, unspecified trimester: Secondary | ICD-10-CM

## 2016-07-18 DIAGNOSIS — Z79899 Other long term (current) drug therapy: Secondary | ICD-10-CM | POA: Diagnosis not present

## 2016-07-18 LAB — COMPREHENSIVE METABOLIC PANEL
ALT: 14 U/L (ref 14–54)
ANION GAP: 6 (ref 5–15)
AST: 22 U/L (ref 15–41)
Albumin: 3.1 g/dL — ABNORMAL LOW (ref 3.5–5.0)
Alkaline Phosphatase: 52 U/L (ref 38–126)
BUN: 9 mg/dL (ref 6–20)
CALCIUM: 9.2 mg/dL (ref 8.9–10.3)
CHLORIDE: 106 mmol/L (ref 101–111)
CO2: 24 mmol/L (ref 22–32)
Creatinine, Ser: 0.57 mg/dL (ref 0.44–1.00)
GFR calc non Af Amer: >60 mL/min (ref >60)
Glucose, Bld: 111 mg/dL — ABNORMAL HIGH (ref 65–99)
Potassium: 3.4 mmol/L — ABNORMAL LOW (ref 3.5–5.1)
SODIUM: 136 mmol/L (ref 135–145)
Total Bilirubin: 0.6 mg/dL (ref 0.3–1.2)
Total Protein: 6.3 g/dL — ABNORMAL LOW (ref 6.5–8.1)

## 2016-07-18 LAB — CBC
HCT: 33.1 % — ABNORMAL LOW (ref 35.0–47.0)
Hemoglobin: 11.4 g/dL — ABNORMAL LOW (ref 12.0–16.0)
MCH: 28.9 pg (ref 26.0–34.0)
MCHC: 34.5 g/dL (ref 32.0–36.0)
MCV: 83.5 fL (ref 80.0–100.0)
PLATELETS: 232 10*3/uL (ref 150–440)
RBC: 3.96 MIL/uL (ref 3.80–5.20)
RDW: 13.8 % (ref 11.5–14.5)
WBC: 8.8 10*3/uL (ref 3.6–11.0)

## 2016-07-18 LAB — TROPONIN I: Troponin I: <0.03 ng/mL (ref <0.03)

## 2016-07-18 MED ORDER — FLUTICASONE-SALMETEROL 100-50 MCG/DOSE IN AEPB: 1.0000 | INHALATION_SPRAY | Freq: Two times a day (BID) | RESPIRATORY_TRACT | 5 refills | Status: DC

## 2016-07-18 NOTE — Progress Notes (Signed)
Went to check on patient, she is currently sound asleep

## 2016-07-18 NOTE — ED Notes (Signed)
Dr Manson PasseyBrown and Gwynneth MunsonButch, RN at bedside at this time to update pt on lab results/ plan of care.

## 2016-07-18 NOTE — ED Provider Notes (Signed)
[facility] Emergency Department Provider Note   First MD Initiated Contact with Patient 07/18/16 [phone]     (approximate)  I have reviewed the triage vital signs and the nursing notes.   HISTORY  Chief Complaint Shortness of Breath    HPI Janet [name] is a 31 y.o. female G2P2 oh (1 previous twin pregnancy) [redacted] weeks pregnant presents to the emergency department with dyspnea as been occurring for the past "a while" with laying flat. Patient states that she feels as though she can't take a full breath. Patient denies any chest pain no lower extremity pain. No history DVT or PE. No fever afebrile on presentation temperature 98.3. Patient also admits to pelvic discomfort 2 days with her current pain score 5 out of 10   Past Medical History:  Diagnosis Date  . Asthma   . Depression   . Headache    migraines    Patient Active Problem List   Diagnosis Date Noted  . Obesity, Class II, BMI 35-39.9 06/27/2016  . Migraine 06/12/2016  . Sickle cell trait (HCC) 05/15/2016  . Supervision of normal first pregnancy, antepartum 04/24/2016  . History of gestational hypertension 04/24/2016  . History of cesarean delivery 04/24/2016  . Obesity in pregnancy 04/24/2016    Past Surgical History:  Procedure Laterality Date  . CESAREAN SECTION    . DILATION AND CURETTAGE OF UTERUS      Prior to Admission medications   Medication Sig Start Date End Date Taking? Authorizing Provider  Butalbital-APAP-Caffeine 50-325-40 MG capsule 1 tab po 2-3x/week at the most for severe HA Patient not taking: Reported on 06/27/2016 06/12/16   [place] BingPickens, Charlie, MD  famotidine (PEPCID) 40 MG/5ML suspension Take 2.5 mLs (20 mg total) by mouth 2 (two) times daily. 06/27/16   [place] BingPickens, Charlie, MD  Prenatal Multivit-Min-Fe-FA (PRENATAL VITAMINS) 0.8 MG tablet Take 1 tablet by mouth daily. 08/01/15   [place]arman CheekStafford, Phillip, MD  tinidazole (TINDAMAX) 500 MG tablet Take 4 tablets (2,000 mg total) by  mouth daily with breakfast. For two days Patient not taking: Reported on 07/18/2016 06/27/16   [place] BingPickens, Charlie, MD    Allergies Flagyl [metronidazole]; Nsaids; Zofran [ondansetron hcl]; and Levaquin [levofloxacin in d5w]  Family History  Problem Relation Age of Onset  . Hypertension Mother   . Heart disease Mother   . Diabetes Father   . Cancer Maternal Aunt        Breast    Social History Social History  Substance Use Topics  . Smoking status: Never Smoker  . Smokeless tobacco: Never Used  . Alcohol use No    Review of Systems Constitutional: No fever/chills Eyes: No visual changes. ENT: No sore throat. Cardiovascular: Denies chest pain. Respiratory: Positive for shortness of breath. Gastrointestinal: No abdominal pain.  No nausea, no vomiting.  No diarrhea.  No constipation. Genitourinary: Negative for dysuria. Positive for pelvic pain Musculoskeletal: Negative for neck pain.  Negative for back pain. Integumentary: Negative for rash. Neurological: Negative for headaches, focal weakness or numbness.   ____________________________________________   PHYSICAL EXAM:  VITAL SIGNS: ED Triage Vitals  Enc Vitals Group     BP 07/18/16 0315 116/76     Pulse Rate 07/18/16 0315 87     Resp 07/18/16 0315 20     Temp 07/18/16 0315 98.3 F (36.8 C)     Temp Source 07/18/16 0315 Oral     SpO2 07/18/16 0315 100 %     Weight 07/18/16 0310 106.6 kg (235 lb)  Height 07/18/16 0310 1.753 m (5\' 9" )     Head Circumference --      Peak Flow --      Pain Score 07/18/16 0310 10     Pain Loc --      Pain Edu? --      Excl. in GC? --     Constitutional: Alert and oriented. Well appearing and in no acute distress. Eyes: Conjunctivae are normal. Head: Atraumatic. Mouth/Throat: Mucous membranes are moist. Oropharynx non-erythematous. Neck: No stridor.  Cardiovascular: Normal rate, regular rhythm. Good peripheral circulation. Grossly normal heart sounds. Respiratory: Normal  respiratory effort.  No retractions. Lungs CTAB. Gastrointestinal: Soft and nontender. No distention.  Musculoskeletal: No lower extremity tenderness nor edema. No gross deformities of extremities. Neurologic:  Normal speech and language. No gross focal neurologic deficits are appreciated.  Skin:  Skin is warm, dry and intact. No rash noted. Psychiatric: Mood and affect are normal. Speech and behavior are normal.  ____________________________________________   LABS (all labs ordered are listed, but only abnormal results are displayed)  Labs Reviewed  CBC - Abnormal; Notable for the following:       Result Value   Hemoglobin 11.4 (*)    HCT 33.1 (*)    All other components within normal limits  COMPREHENSIVE METABOLIC PANEL - Abnormal; Notable for the following:    Potassium 3.4 (*)    Glucose, Bld 111 (*)    Total Protein 6.3 (*)    Albumin 3.1 (*)    All other components within normal limits  TROPONIN I    RADIOLOGY I, [place] Melis Trochez, personally viewed and evaluated these images (plain radiographs) as part of my medical decision making, as well as reviewing the written report by the radiologist.  US Ob Limited  Result Date: 07/18/2016 CLINICAL DATA:  Acute onset of pelvic pain.  Initial encounter. EXAM: LIMITED OBSTETRIC ULTRASOUND FINDINGS: Number of Fetuses: 1 Heart Rate:  149 bpm Movement: Yes Presentation: Breech Placental Location: Anterior Previa: No Amniotic Fluid (Subjective):  Within normal limits. AFI:  18.3 cm BPD:  6.03 cm     24 w 4 d MATERNAL FINDINGS: Cervix:  Appears closed. Uterus/Adnexae: No abnormality visualized. IMPRESSION: Single live intrauterine pregnancy noted, with a biparietal diameter of 6.0 cm, corresponding to a gestational age of [redacted] weeks 4 days. This matches the gestational age of [redacted] weeks 0 days by LMP, reflecting an estimated date of delivery of October 24, 2016. Normal amount of amniotic fluid noted. No evidence of placenta previa. The cervix  remains closed. This exam is performed on an emergent basis and does not comprehensively evaluate fetal size, dating, or anatomy; follow-up complete OB US should be considered if further fetal assessment is warranted. Electronically Signed   By: Roanna Raider M.D.   On: 07/18/2016 06:05     Procedures   ____________________________________________   INITIAL IMPRESSION / ASSESSMENT AND PLAN / ED COURSE  Pertinent labs & imaging results that were available during my care of the patient were reviewed by me and considered in my medical decision making (see chart for details).  31 year old [redacted] weeks pregnant female presenting with dyspnea with laying flat. Physical exam revealed no gross abnormality lungs are to auscultation patient suction saturation 100% on room air. No apparent increased work of breathing. Suspect patient's dyspnea secondary to gravid uterus. Chest x-ray was not performed given normal clinical exam. Regarding patient's pelvic discomfort ultrasound performed which revealed no acute abnormality single live IUP. Patient discussed with Dr.  Ward OB/GYN on call who will evaluate the patient on labor and delivery.      ____________________________________________  FINAL CLINICAL IMPRESSION(S) / ED DIAGNOSES  Final diagnoses:  Dyspnea, unspecified type     MEDICATIONS GIVEN DURING THIS VISIT:  Medications - No data to display   NEW OUTPATIENT MEDICATIONS STARTED DURING THIS VISIT:  Discharge Medication List as of 07/18/2016  5:58 AM      Discharge Medication List as of 07/18/2016  5:58 AM      Discharge Medication List as of 07/18/2016  5:58 AM       Note:  This document was prepared using Dragon voice recognition software and may include unintentional dictation errors.    Darci Current, MD 07/18/16 [phone]

## 2016-07-18 NOTE — ED Notes (Signed)
Pt returned to ED Rm 3 from US at this time.

## 2016-07-18 NOTE — Progress Notes (Signed)
Dr Elesa MassedWard notified of pt arrival from ED and pt complaint. MD aware of tests done in ED and VS, including O2 sats. MD notified of assessment findings of shallow breaths sounds with a pt history of asthma. MD also notified of pt complaint of lower abdominal pain but is sleeping on and off. Orders received to continue to monitor and MD will come evaluate pt.

## 2016-07-18 NOTE — OB Triage Note (Signed)
Pt arrived from the ED with complaints of shortness of breath and lower abdominal pain that has been on and off x 1 month, that worsened last night. Pt reports positive fetal movement. Pt denies any leaking of fluid or vaginal bleeding. Pt received work up in ED for shortness of breath. EFM and TOCO applied

## 2016-07-18 NOTE — Discharge Summary (Signed)
Janet [name] is a 31 y.o. female. She is at [redacted]w[redacted]d gestation. Patient's last menstrual period was 01/18/2016. Estimated Date of Delivery: 10/24/16  Prenatal care site: [facility]  Chief complaint: SOB and abdominal pain  Location: lungs, low pelvis Onset/timing: about 4 weeks ago Duration: intermittent during that time Quality:  difficutly taking a big breath, aching sometimes sharp pains in low abd Severity: mild, both Aggravating or alleviating conditions: nothing makes better or worse Associated signs/symptoms: no CTX, no VB.no LOF,  Active fetal movement. Context: Janet [name] has ashtma, and was on Advair in her home country of [place], but the meds she got upon arrival to the [place] were not the same nor did she know how to use them properly, so she stopped using them.  This pregnancy she feels as though her breaths are restricted, like she did during asthma flares.   Her low abd pain is above her pubic bone and intermittent, lasting long times, not like contractions.  It is generally an ache with some occasional sharp twinges.  History of cesarean.  She was evaluated in the ED prior to arrival in triage, where cardiac etiology was ruled out and O2 stats have remained 99-100%.    S: Resting comfortably, talking normally on the phone  Maternal Medical History:   Past Medical History:  Diagnosis Date  . Asthma   . Depression   . Headache    migraines    Past Surgical History:  Procedure Laterality Date  . CESAREAN SECTION    . DILATION AND CURETTAGE OF UTERUS      Allergies  Allergen Reactions  . Flagyl [Metronidazole] Itching  . Nsaids Swelling    Facial swelling and temporary blindness   . Zofran [Ondansetron Hcl] Other (See Comments)    Pt states "it made me crazy"  . Levaquin [Levofloxacin In D5w] Rash    Prior to Admission medications   Medication Sig Start Date End Date Taking? Authorizing Provider  Butalbital-APAP-Caffeine 50-325-40 MG  capsule 1 tab po 2-3x/week at the most for severe HA Patient not taking: Reported on 06/27/2016 06/12/16   [place] Bing, MD  famotidine (PEPCID) 40 MG/5ML suspension Take 2.5 mLs (20 mg total) by mouth 2 (two) times daily. 06/27/16   [place] Bing, MD  Fluticasone-Salmeterol (ADVAIR DISKUS) 100-50 MCG/DOSE AEPB Inhale 1 puff into the lungs 2 (two) times daily. 07/18/16 07/18/17  [place] Khalsa, Elenora Fender, MD  Prenatal Multivit-Min-Fe-FA (PRENATAL VITAMINS) 0.8 MG tablet Take 1 tablet by mouth daily. 08/01/15   Sharman Cheek, MD  tinidazole (TINDAMAX) 500 MG tablet Take 4 tablets (2,000 mg total) by mouth daily with breakfast. For two days Patient not taking: Reported on 07/18/2016 06/27/16   [place] Bing, MD     Social History: She  reports that she has never smoked. She has never used smokeless tobacco. She reports that she does not drink alcohol or use drugs.  Family History: family history includes Cancer in her maternal aunt; Diabetes in her father; Heart disease in her mother; Hypertension in her mother.    Review of Systems: A full review of systems was performed and negative except as noted in the HPI.     O:  BP 115/63 (BP Location: Left Arm)   Pulse 74   Temp 97.9 F (36.6 C) (Oral)   Resp 18   LMP 01/18/2016   SpO2 100%  Results for orders placed or performed during the hospital encounter of 07/18/16 (from the past 48 hour(s))  CBC  Collection Time: 07/18/16  4:09 AM  Result Value Ref Range   WBC 8.8 3.6 - 11.0 K/uL   RBC 3.96 3.80 - 5.20 MIL/uL   Hemoglobin 11.4 (L) 12.0 - 16.0 g/dL   HCT 69.633.1 (L) 29.535.0 - 28.447.0 %   MCV 83.5 80.0 - 100.0 fL   MCH 28.9 26.0 - 34.0 pg   MCHC 34.5 32.0 - 36.0 g/dL   RDW 13.213.8 44.011.5 - 10.214.5 %   Platelets 232 150 - 440 K/uL  Comprehensive metabolic panel   Collection Time: 07/18/16  4:09 AM  Result Value Ref Range   Sodium 136 135 - 145 mmol/L   Potassium 3.4 (L) 3.5 - 5.1 mmol/L   Chloride 106 101 - 111 mmol/L   CO2 24 22 - 32 mmol/L    Glucose, Bld 111 (H) 65 - 99 mg/dL   BUN 9 6 - 20 mg/dL   Creatinine, Ser 7.250.57 0.44 - 1.00 mg/dL   Calcium 9.2 8.9 - 36.610.3 mg/dL   Total Protein 6.3 (L) 6.5 - 8.1 g/dL   Albumin 3.1 (L) 3.5 - 5.0 g/dL   AST 22 15 - 41 U/L   ALT 14 14 - 54 U/L   Alkaline Phosphatase 52 38 - 126 U/L   Total Bilirubin 0.6 0.3 - 1.2 mg/dL   GFR calc non Af Amer >60 >60 mL/min   GFR calc Af Amer >60 >60 mL/min   Anion gap 6 5 - 15  Troponin I   Collection Time: 07/18/16  4:09 AM  Result Value Ref Range   Troponin I <0.03 <0.03 ng/mL     Constitutional: NAD, AAOx3  HE/ENT: extraocular movements grossly intact, moist mucous membranes CV: RRR PULM: nl respiratory effort, CTABL     Abd: gravid, non-tender, non-distended, soft      Ext: Non-tender, Nonedmeatous   Psych: mood appropriate, speech normal Pelvic deferred  NST:  Baseline: 155 Variability: moderate Accelerations present x >2 (10x10) Decelerations absent     A/P: 31 y.o. 5690w0d here for SOB and diffuse low abd pain.  Labor: not present.   Fetal Wellbeing: Reassuring Cat 1 tracing.  Reactive NST   Diffuse mild low pelvic pain intermittent and currently resolved.    SOB: likely related to her untreated asthma.  Was on Advair prior to pregnancy, will order this for her to take at home PRN.  To follow up with her primary OBGYN and recommended pulmonary function testing.  D/c home stable, precautions reviewed, follow-up as scheduled.   ----- Janet Plumberhelsea Shamari Lofquist, MD Attending Obstetrician and Gynecologist [facility], Department of OB/GYN [facility]

## 2016-07-18 NOTE — ED Triage Notes (Signed)
Pt in with co acute onset of lower abd pain and shob, pt is [redacted] weeks pregnant. Has hx of asthma but states does not feel the same.

## 2016-07-24 ENCOUNTER — Ambulatory Visit (INDEPENDENT_AMBULATORY_CARE_PROVIDER_SITE_OTHER): Payer: Medicaid Other | Admitting: Obstetrics & Gynecology

## 2016-07-24 ENCOUNTER — Other Ambulatory Visit (HOSPITAL_COMMUNITY)
Admission: RE | Admit: 2016-07-24 | Discharge: 2016-07-24 | Disposition: A | Discharge status: Home / Self Care | Payer: Medicaid Other | Source: Ambulatory Visit | Attending: Obstetrics & Gynecology | Admitting: Obstetrics & Gynecology

## 2016-07-24 VITALS — BP 111/75 | HR 98 | Wt 242.0 lb

## 2016-07-24 DIAGNOSIS — Z0489 Encounter for examination and observation for other specified reasons: Secondary | ICD-10-CM

## 2016-07-24 DIAGNOSIS — Z98891 History of uterine scar from previous surgery: Secondary | ICD-10-CM

## 2016-07-24 DIAGNOSIS — O34219 Maternal care for unspecified type scar from previous cesarean delivery: Secondary | ICD-10-CM

## 2016-07-24 DIAGNOSIS — Z3402 Encounter for supervision of normal first pregnancy, second trimester: Secondary | ICD-10-CM | POA: Diagnosis present

## 2016-07-24 DIAGNOSIS — Z3A26 26 weeks gestation of pregnancy: Secondary | ICD-10-CM | POA: Insufficient documentation

## 2016-07-24 DIAGNOSIS — N898 Other specified noninflammatory disorders of vagina: Secondary | ICD-10-CM

## 2016-07-24 DIAGNOSIS — Z048 Encounter for examination and observation for other specified reasons: Secondary | ICD-10-CM

## 2016-07-24 DIAGNOSIS — O99512 Diseases of the respiratory system complicating pregnancy, second trimester: Secondary | ICD-10-CM | POA: Insufficient documentation

## 2016-07-24 DIAGNOSIS — G43909 Migraine, unspecified, not intractable, without status migrainosus: Secondary | ICD-10-CM | POA: Diagnosis not present

## 2016-07-24 DIAGNOSIS — O26892 Other specified pregnancy related conditions, second trimester: Secondary | ICD-10-CM | POA: Insufficient documentation

## 2016-07-24 DIAGNOSIS — Z23 Encounter for immunization: Secondary | ICD-10-CM

## 2016-07-24 DIAGNOSIS — B9689 Other specified bacterial agents as the cause of diseases classified elsewhere: Secondary | ICD-10-CM

## 2016-07-24 DIAGNOSIS — Z6839 Body mass index (BMI) 39.0-39.9, adult: Secondary | ICD-10-CM | POA: Diagnosis not present

## 2016-07-24 DIAGNOSIS — J45909 Unspecified asthma, uncomplicated: Secondary | ICD-10-CM | POA: Diagnosis not present

## 2016-07-24 DIAGNOSIS — O99012 Anemia complicating pregnancy, second trimester: Secondary | ICD-10-CM | POA: Insufficient documentation

## 2016-07-24 DIAGNOSIS — D573 Sickle-cell trait: Secondary | ICD-10-CM | POA: Diagnosis not present

## 2016-07-24 DIAGNOSIS — O99519 Diseases of the respiratory system complicating pregnancy, unspecified trimester: Secondary | ICD-10-CM

## 2016-07-24 DIAGNOSIS — N760 Acute vaginitis: Secondary | ICD-10-CM

## 2016-07-24 DIAGNOSIS — IMO0002 Reserved for concepts with insufficient information to code with codable children: Secondary | ICD-10-CM

## 2016-07-24 DIAGNOSIS — O99212 Obesity complicating pregnancy, second trimester: Secondary | ICD-10-CM | POA: Diagnosis not present

## 2016-07-24 DIAGNOSIS — Z3482 Encounter for supervision of other normal pregnancy, second trimester: Secondary | ICD-10-CM

## 2016-07-24 LAB — OB RESULTS CONSOLE HIV ANTIBODY (ROUTINE TESTING): HIV: NONREACTIVE

## 2016-07-24 MED ORDER — MONTELUKAST SODIUM 10 MG PO TABS: 10.0000 mg | ORAL_TABLET | Freq: Every day | ORAL | 3 refills | Status: DC

## 2016-07-24 MED ORDER — ALBUTEROL SULFATE HFA 108 (90 BASE) MCG/ACT IN AERS: 2.0000 | INHALATION_SPRAY | Freq: Four times a day (QID) | RESPIRATORY_TRACT | 2 refills | Status: DC | PRN

## 2016-07-24 MED ORDER — FLUCONAZOLE 150 MG PO TABS: 150.0000 mg | ORAL_TABLET | Freq: Once | ORAL | 3 refills | Status: AC

## 2016-07-24 NOTE — Progress Notes (Signed)
PRENATAL VISIT NOTE  Subjective:  Janet [name] is a 31 y.o. G3P1012 at [redacted]w[redacted]d being seen today for ongoing prenatal care.  She is currently monitored for the following issues for this low-risk pregnancy and has Supervision of normal first pregnancy, antepartum; History of gestational hypertension; History of cesarean delivery; Obesity in pregnancy; Sickle cell trait (HCC); Migraine; Obesity, Class II, BMI 35-39.9; and Asthma affecting pregnancy, antepartum on her problem list.  Patient reports vaginal irritation x 3 days. Also needs medications for asthma maintenance.  Contractions: Not present. Vag. Bleeding: None.  Movement: Present. Denies leaking of fluid.   The following portions of the patient's history were reviewed and updated as appropriate: allergies, current medications, past family history, past medical history, past social history, past surgical history and problem list. Problem list updated.  Objective:   Vitals:   07/24/16 0838  BP: 111/75  Pulse: 98  Weight: 242 lb (109.8 kg)    Fetal Status: Fetal Heart Rate (bpm): 141 Fundal Height: 28 cm Movement: Present     General:  Alert, oriented and cooperative. Patient is in no acute distress.  Skin: Skin is warm and dry. No rash noted.   Cardiovascular: Normal heart rate noted  Respiratory: Normal respiratory effort, no problems with respiration noted  Abdomen: Soft, gravid, appropriate for gestational age. Pain/Pressure: Present     Pelvic:  Cervical exam deferred      White discharge seen, sample obtained.  Extremities: Normal range of motion.  Edema: None  Mental Status: Normal mood and affect. Normal behavior. Normal judgment and thought content.   US Ob Limited  Result Date: 07/18/2016 CLINICAL DATA:  Acute onset of pelvic pain.  Initial encounter. EXAM: LIMITED OBSTETRIC ULTRASOUND FINDINGS: Number of Fetuses: 1 Heart Rate:  149 bpm Movement: Yes Presentation: Breech Placental Location: Anterior Previa: No Amniotic  Fluid (Subjective):  Within normal limits. AFI:  18.3 cm BPD:  6.03 cm     24 w 4 d MATERNAL FINDINGS: Cervix:  Appears closed. Uterus/Adnexae: No abnormality visualized. IMPRESSION: Single live intrauterine pregnancy noted, with a biparietal diameter of 6.0 cm, corresponding to a gestational age of [redacted] weeks 4 days. This matches the gestational age of [redacted] weeks 0 days by LMP, reflecting an estimated date of delivery of October 24, 2016. Normal amount of amniotic fluid noted. No evidence of placenta previa. The cervix remains closed. This exam is performed on an emergent basis and does not comprehensively evaluate fetal size, dating, or anatomy; follow-up complete OB US should be considered if further fetal assessment is warranted. Electronically Signed   By: Roanna Raider M.D.   On: 07/18/2016 06:05   [place] [facility] Ob Follow Up  Result Date: 07/10/2016 ----------------------------------------------------------------------  OBSTETRICS REPORT                      (Signed Final 07/10/2016 12:44 pm) ---------------------------------------------------------------------- Patient Info  ID #:       [id number]                         D.O.B.:   12/22/1985 (30 yrs)  Name:       Janet [name]                  Visit Date:  07/10/2016 11:22 am ---------------------------------------------------------------------- Performed By  Performed By:     Hurman Horn          Ref. Address:     [address]  [address]                                                             [place], [place]                                                             [address]  Attending:        Clarene Critchley Whitecar        Location:         [facility]                    MD  Referred By:      Orchard Bing MD ---------------------------------------------------------------------- Orders   #  Description                                 Code   1  [place] [facility] OB FOLLOW UP                          [phone]  ----------------------------------------------------------------------   #  Ordered By               Order #        Accession #    Episode #   1  [place] Bing          [id number]      [id number]     [id number]  ---------------------------------------------------------------------- Indications   [redacted] weeks gestation of pregnancy                Z3A.24   Encounter for other antenatal screening        Z36.2   follow-up   Poor obstetric history: Previous               O09.299   preeclampsia / eclampsia/gestational HTN   History of cesarean delivery, currently        O34.219   pregnant   History of sickle cell trait                   Z86.2   Obesity complicating pregnancy, second         O99.212   trimester  ---------------------------------------------------------------------- OB History  Blood Type:            Height:  5'9"   Weight (lb):  239      BMI:   35.29  Gravidity:    3         Term:   2  SAB:   1  Living:       2 ---------------------------------------------------------------------- Fetal Evaluation  Num Of Fetuses:     1  Fetal Heart         148  Rate(bpm):  Cardiac Activity:   Observed  Presentation:       Breech  Placenta:           Anterior, above cervical os  P. Cord Insertion:  Visualized, central  Amniotic Fluid  AFI FV:      Subjectively within normal limits                              Largest Pocket(cm)                              3.82 ---------------------------------------------------------------------- Biometry  BPD:      56.7  mm     G. Age:  23w 2d          5  %    CI:        75.64   %   70 - 86                                                          FL/HC:      21.3   %   18.7 - 20.3  HC:      206.7  mm     G. Age:  22w 5d        < 3  %    HC/AC:      1.04       1.04 - 1.22  AC:      198.4  mm     G. Age:  24w 4d         30  %    FL/BPD:     77.8   %   71 - 87  FL:       44.1  mm     G. Age:  24w 4d         26  %    FL/AC:      22.2   %   20 - 24  Est. FW:      678  gm      1 lb 8 oz     39  % ---------------------------------------------------------------------- Gestational Age  LMP:           24w 6d       Date:   01/18/16                 EDD:   10/24/16  U/S Today:     23w 6d                                        EDD:   10/31/16  Best:          24w 6d    Det. By:   LMP  (01/18/16)          EDD:   10/24/16 ---------------------------------------------------------------------- Anatomy  Cranium:  Appears normal         LVOT:                   Appears normal  Cavum:                 Appears normal         Aortic Arch:            Not well visualized  Ventricles:            Appears normal         Ductal Arch:            Previously seen  Choroid Plexus:        Appears normal         Diaphragm:              Appears normal  Cerebellum:            Appears normal         Stomach:                Appears normal, left                                                                        sided  Posterior Fossa:       Appears normal         Abdomen:                Appears normal  Nuchal Fold:           Appears normal         Abdominal Wall:         Appears nml (cord                                                                        insert, abd wall)  Face:                  Appears normal         Cord Vessels:           Appears normal (3                         (orbits and profile)                           vessel cord)  Lips:                  Appears normal         Bladder:                Appears normal  Thoracic:              Appears normal         Spine:                  Not  well visualized  Heart:                 Appears normal         Upper Extremities:      Previously seen                         (4CH, axis, and situs  RVOT:                  Appears normal         Lower Extremities:      Previously seen  Other:  Fetus appears to be a female. Nasal bone previously seen.          Technically difficult due to maternal habitus and fetal position.  ---------------------------------------------------------------------- Cervix Uterus Adnexa  Cervix  Length:           5.64  cm.  Normal appearance by transabdominal scan.  Uterus  No abnormality visualized.  Left Ovary  Within normal limits.  Right Ovary  Within normal limits.  Adnexa:       No abnormality visualized. ---------------------------------------------------------------------- Impression  Single IUP at 24w 6d  Limited views of the fetal spine obtained due to fetal position  The remainder of the fetal anatomy appears normal  The estimated fetal weight is at the 39th %tile  Anterior placenta without previa  Normal amniotic fluid volume ---------------------------------------------------------------------- Recommendations  Recommend follow-up ultrasound examination in 4 weeks for  interval growth - will attempt to reevaluate the fetal spine at  that time ----------------------------------------------------------------------                Candis Shine, MD Electronically Signed Final Report   07/10/2016 12:44 pm ----------------------------------------------------------------------   Assessment and Plan:  Pregnancy: [id number] at [redacted]w[redacted]d  1. Asthma affecting pregnancy, antepartum Already on Advair. Singulair and Ventolin added, - montelukast (SINGULAIR) 10 MG tablet; Take 1 tablet (10 mg total) by mouth at bedtime.  Dispense: 30 tablet; Refill: 3 - albuterol (PROVENTIL HFA;VENTOLIN HFA) 108 (90 Base) MCG/ACT inhaler; Inhale 2 puffs into the lungs every 6 (six) hours as needed for wheezing or shortness of breath.  Dispense: 1 Inhaler; Refill: 2  2. History of cesarean delivery Counseled about TOLAC and RCS in detail; desires RCS.   3. Vaginal discharge during pregnancy in second trimester Diflucan presumptively prescribed; had recent treatment for BV. - Cervicovaginal ancillary only - fluconazole (DIFLUCAN) 150 MG tablet; Take 1 tablet (150 mg total) by mouth once. Can take additional dose  three days later if symptoms persist  Dispense: 1 tablet; Refill: 3  4. Evaluate anatomy not seen on prior sonogram Spine not evaluated well yet.  - [place] MFM OB FOLLOW UP; Future  5. Encounter for supervision of normal first pregnancy in second trimester - CBC - HIV antibody - RPR - Glucose Tolerance, 2 Hours w/1 Hour - Tdap vaccine greater than or equal to 7yo IM - Culture, OB Urine Preterm labor symptoms and general obstetric precautions including but not limited to vaginal bleeding, contractions, leaking of fluid and fetal movement were reviewed in detail with the patient. Please refer to After Visit Summary for other counseling recommendations.  Return in about 5 weeks (around 08/28/2016) for OB 32 week visit (Babyscripts).   Jaynie Collins, MD

## 2016-07-24 NOTE — Patient Instructions (Signed)
Third Trimester of Pregnancy The third trimester is from week 28 through week 40 (months 7 through 9). The third trimester is a time when the unborn baby (fetus) is growing rapidly. At the end of the ninth month, the fetus is about 20 inches in length and weighs 6-10 pounds. Body changes during your third trimester Your body will continue to go through many changes during pregnancy. The changes vary from woman to woman. During the third trimester:  Your weight will continue to increase. You can expect to gain 25-35 pounds (11-16 kg) by the end of the pregnancy.  You may begin to get stretch marks on your hips, abdomen, and breasts.  You may urinate more often because the fetus is moving lower into your pelvis and pressing on your bladder.  You may develop or continue to have heartburn. This is caused by increased hormones that slow down muscles in the digestive tract.  You may develop or continue to have constipation because increased hormones slow digestion and cause the muscles that push waste through your intestines to relax.  You may develop hemorrhoids. These are swollen veins (varicose veins) in the rectum that can itch or be painful.  You may develop swollen, bulging veins (varicose veins) in your legs.  You may have increased body aches in the pelvis, back, or thighs. This is due to weight gain and increased hormones that are relaxing your joints.  You may have changes in your hair. These can include thickening of your hair, rapid growth, and changes in texture. Some women also have hair loss during or after pregnancy, or hair that feels dry or thin. Your hair will most likely return to normal after your baby is born.  Your breasts will continue to grow and they will continue to become tender. A yellow fluid (colostrum) may leak from your breasts. This is the first milk you are producing for your baby.  Your belly button may stick out.  You may notice more swelling in your hands,  face, or ankles.  You may have increased tingling or numbness in your hands, arms, and legs. The skin on your belly may also feel numb.  You may feel short of breath because of your expanding uterus.  You may have more problems sleeping. This can be caused by the size of your belly, increased need to urinate, and an increase in your body's metabolism.  You may notice the fetus "dropping," or moving lower in your abdomen (lightening).  You may have increased vaginal discharge.  You may notice your joints feel loose and you may have pain around your pelvic bone.  What to expect at prenatal visits You will have prenatal exams every 2 weeks until week 36. Then you will have weekly prenatal exams. During a routine prenatal visit:  You will be weighed to make sure you and the baby are growing normally.  Your blood pressure will be taken.  Your abdomen will be measured to track your baby's growth.  The fetal heartbeat will be listened to.  Any test results from the previous visit will be discussed.  You may have a cervical check near your due date to see if your cervix has softened or thinned (effaced).  You will be tested for Group B streptococcus. This happens between 35 and 37 weeks.  Your health care provider may ask you:  What your birth plan is.  How you are feeling.  If you are feeling the baby move.  If you have had   any abnormal symptoms, such as leaking fluid, bleeding, severe headaches, or abdominal cramping.  If you are using any tobacco products, including cigarettes, chewing tobacco, and electronic cigarettes.  If you have any questions.  Other tests or screenings that may be performed during your third trimester include:  Blood tests that check for low iron levels (anemia).  Fetal testing to check the health, activity level, and growth of the fetus. Testing is done if you have certain medical conditions or if there are problems during the  pregnancy.  Nonstress test (NST). This test checks the health of your baby to make sure there are no signs of problems, such as the baby not getting enough oxygen. During this test, a belt is placed around your belly. The baby is made to move, and its heart rate is monitored during movement.  What is false labor? False labor is a condition in which you feel small, irregular tightenings of the muscles in the womb (contractions) that usually go away with rest, changing position, or drinking water. These are called Braxton Hicks contractions. Contractions may last for hours, days, or even weeks before true labor sets in. If contractions come at regular intervals, become more frequent, increase in intensity, or become painful, you should see your health care provider. What are the signs of labor?  Abdominal cramps.  Regular contractions that start at 10 minutes apart and become stronger and more frequent with time.  Contractions that start on the top of the uterus and spread down to the lower abdomen and back.  Increased pelvic pressure and dull back pain.  A watery or bloody mucus discharge that comes from the vagina.  Leaking of amniotic fluid. This is also known as your "water breaking." It could be a slow trickle or a gush. Let your health care provider know if it has a color or strange odor. If you have any of these signs, call your health care provider right away, even if it is before your due date. Follow these instructions at home: Medicines  Follow your health care provider's instructions regarding medicine use. Specific medicines may be either safe or unsafe to take during pregnancy.  Take a prenatal vitamin that contains at least 600 micrograms (mcg) of folic acid.  If you develop constipation, try taking a stool softener if your health care provider approves. Eating and drinking  Eat a balanced diet that includes fresh fruits and vegetables, whole grains, good sources of protein  such as meat, eggs, or tofu, and low-fat dairy. Your health care provider will help you determine the amount of weight gain that is right for you.  Avoid raw meat and uncooked cheese. These carry germs that can cause birth defects in the baby.  If you have low calcium intake from food, talk to your health care provider about whether you should take a daily calcium supplement.  Eat four or five small meals rather than three large meals a day.  Limit foods that are high in fat and processed sugars, such as fried and sweet foods.  To prevent constipation: ? Drink enough fluid to keep your urine clear or pale yellow. ? Eat foods that are high in fiber, such as fresh fruits and vegetables, whole grains, and beans. Activity  Exercise only as directed by your health care provider. Most women can continue their usual exercise routine during pregnancy. Try to exercise for 30 minutes at least 5 days a week. Stop exercising if you experience uterine contractions.  Avoid heavy   lifting.  Do not exercise in extreme heat or humidity, or at high altitudes.  Wear low-heel, comfortable shoes.  Practice good posture.  You may continue to have sex unless your health care provider tells you otherwise. Relieving pain and discomfort  Take frequent breaks and rest with your legs elevated if you have leg cramps or low back pain.  Take warm sitz baths to soothe any pain or discomfort caused by hemorrhoids. Use hemorrhoid cream if your health care provider approves.  Wear a good support bra to prevent discomfort from breast tenderness.  If you develop varicose veins: ? Wear support pantyhose or compression stockings as told by your healthcare provider. ? Elevate your feet for 15 minutes, 3-4 times a day. Prenatal care  Write down your questions. Take them to your prenatal visits.  Keep all your prenatal visits as told by your health care provider. This is important. Safety  Wear your seat belt at  all times when driving.  Make a list of emergency phone numbers, including numbers for family, friends, the hospital, and police and fire departments. General instructions  Avoid cat litter boxes and soil used by cats. These carry germs that can cause birth defects in the baby. If you have a cat, ask someone to clean the litter box for you.  Do not travel far distances unless it is absolutely necessary and only with the approval of your health care provider.  Do not use hot tubs, steam rooms, or saunas.  Do not drink alcohol.  Do not use any products that contain nicotine or tobacco, such as cigarettes and e-cigarettes. If you need help quitting, ask your health care provider.  Do not use any medicinal herbs or unprescribed drugs. These chemicals affect the formation and growth of the baby.  Do not douche or use tampons or scented sanitary pads.  Do not cross your legs for long periods of time.  To prepare for the arrival of your baby: ? Take prenatal classes to understand, practice, and ask questions about labor and delivery. ? Make a trial run to the hospital. ? Visit the hospital and tour the maternity area. ? Arrange for maternity or paternity leave through employers. ? Arrange for family and friends to take care of pets while you are in the hospital. ? Purchase a rear-facing car seat and make sure you know how to install it in your car. ? Pack your hospital bag. ? Prepare the baby's nursery. Make sure to remove all pillows and stuffed animals from the baby's crib to prevent suffocation.  Visit your dentist if you have not gone during your pregnancy. Use a soft toothbrush to brush your teeth and be gentle when you floss. Contact a health care provider if:  You are unsure if you are in labor or if your water has broken.  You become dizzy.  You have mild pelvic cramps, pelvic pressure, or nagging pain in your abdominal area.  You have lower back pain.  You have persistent  nausea, vomiting, or diarrhea.  You have an unusual or bad smelling vaginal discharge.  You have pain when you urinate. Get help right away if:  Your water breaks before 37 weeks.  You have regular contractions less than 5 minutes apart before 37 weeks.  You have a fever.  You are leaking fluid from your vagina.  You have spotting or bleeding from your vagina.  You have severe abdominal pain or cramping.  You have rapid weight loss or weight gain.    You have shortness of breath with chest pain.  You notice sudden or extreme swelling of your face, hands, ankles, feet, or legs.  Your baby makes fewer than 10 movements in 2 hours.  You have severe headaches that do not go away when you take medicine.  You have vision changes. Summary  The third trimester is from week 28 through week 40, months 7 through 9. The third trimester is a time when the unborn baby (fetus) is growing rapidly.  During the third trimester, your discomfort may increase as you and your baby continue to gain weight. You may have abdominal, leg, and back pain, sleeping problems, and an increased need to urinate.  During the third trimester your breasts will keep growing and they will continue to become tender. A yellow fluid (colostrum) may leak from your breasts. This is the first milk you are producing for your baby.  False labor is a condition in which you feel small, irregular tightenings of the muscles in the womb (contractions) that eventually go away. These are called Braxton Hicks contractions. Contractions may last for hours, days, or even weeks before true labor sets in.  Signs of labor can include: abdominal cramps; regular contractions that start at 10 minutes apart and become stronger and more frequent with time; watery or bloody mucus discharge that comes from the vagina; increased pelvic pressure and dull back pain; and leaking of amniotic fluid. This information is not intended to replace advice  given to you by your health care provider. Make sure you discuss any questions you have with your health care provider. Document Released: 12/24/2000 Document Revised: 06/07/2015 Document Reviewed: 03/02/2012 Elsevier Interactive Patient Education  2017 [employer]  

## 2016-07-25 ENCOUNTER — Encounter: Payer: Self-pay | Admitting: Obstetrics & Gynecology

## 2016-07-25 ENCOUNTER — Encounter: Payer: Medicaid Other | Admitting: Family Medicine

## 2016-07-25 LAB — CBC
HEMATOCRIT: 34.8 % (ref 34.0–46.6)
Hemoglobin: 11.4 g/dL (ref 11.1–15.9)
MCH: 28 pg (ref 26.6–33.0)
MCHC: 32.8 g/dL (ref 31.5–35.7)
MCV: 86 fL (ref 79–97)
PLATELETS: 227 10*3/uL (ref 150–379)
RBC: 4.07 x10E6/uL (ref 3.77–5.28)
RDW: 14.2 % (ref 12.3–15.4)
WBC: 7.7 10*3/uL (ref 3.4–10.8)

## 2016-07-25 LAB — CERVICOVAGINAL ANCILLARY ONLY
BACTERIAL VAGINITIS: POSITIVE — AB
CANDIDA VAGINITIS: POSITIVE — AB
Chlamydia: NEGATIVE
NEISSERIA GONORRHEA: NEGATIVE
TRICH (WINDOWPATH): NEGATIVE

## 2016-07-25 LAB — GLUCOSE TOLERANCE, 2 HOURS W/ 1HR
GLUCOSE, 1 HOUR: 126 mg/dL (ref 65–179)
GLUCOSE, FASTING: 98 mg/dL — AB (ref 65–91)
Glucose, 2 hour: 114 mg/dL (ref 65–152)

## 2016-07-25 LAB — HIV ANTIBODY (ROUTINE TESTING W REFLEX): HIV Screen 4th Generation wRfx: NONREACTIVE

## 2016-07-25 LAB — RPR: RPR: NONREACTIVE

## 2016-07-28 ENCOUNTER — Encounter: Payer: Self-pay | Admitting: *Deleted

## 2016-07-28 ENCOUNTER — Telehealth: Payer: Self-pay | Admitting: *Deleted

## 2016-07-28 DIAGNOSIS — O24410 Gestational diabetes mellitus in pregnancy, diet controlled: Secondary | ICD-10-CM

## 2016-07-28 MED ORDER — ACCU-CHEK AVIVA DEVI: 0 refills | Status: DC

## 2016-07-28 MED ORDER — ACCU-CHEK SOFT TOUCH LANCETS MISC: 12 refills | Status: DC

## 2016-07-28 MED ORDER — CLINDAMYCIN HCL 300 MG PO CAPS: 300.0000 mg | ORAL_CAPSULE | Freq: Two times a day (BID) | ORAL | 0 refills | Status: DC

## 2016-07-28 MED ORDER — GLUCOSE BLOOD VI STRP: ORAL_STRIP | 12 refills | Status: DC

## 2016-07-28 NOTE — Addendum Note (Signed)
Addended by: Jaynie CollinsANYANWU, Cecila Satcher A on: 07/28/2016 08:19 AM   Modules accepted: Orders

## 2016-07-28 NOTE — Telephone Encounter (Signed)
Informed pt of results and treatment as well as results from 2 hr GTT and GDM diagnosis.  Sent supplies to pharmacy and referral to [facility] to set up appt.  Pt acknowledged instructions.

## 2016-07-28 NOTE — Telephone Encounter (Signed)
-----   Message from Tereso NewcomerUgonna A Anyanwu, MD sent at 07/28/2016  8:20 AM EDT ----- Vaginal discharge test is abnormal and showed bacterial vaginitis and yeast infection. Anti-yeast therapy already prescribed. Given allergy to Metronidazole, Clindamycin prescribed for bacterial vaginosis.  Please inform patient of results and advise to pick up prescription. Patient was also informed via MyChart and advised to pick up prescription.

## 2016-08-03 ENCOUNTER — Observation Stay
Admission: EM | Admit: 2016-08-03 | Discharge: 2016-08-04 | Disposition: A | Discharge status: Home / Self Care | Payer: Medicaid Other | Attending: Obstetrics & Gynecology | Admitting: Obstetrics & Gynecology

## 2016-08-03 DIAGNOSIS — O26899 Other specified pregnancy related conditions, unspecified trimester: Secondary | ICD-10-CM | POA: Diagnosis present

## 2016-08-03 DIAGNOSIS — R1030 Lower abdominal pain, unspecified: Secondary | ICD-10-CM | POA: Insufficient documentation

## 2016-08-03 DIAGNOSIS — O99513 Diseases of the respiratory system complicating pregnancy, third trimester: Secondary | ICD-10-CM | POA: Insufficient documentation

## 2016-08-03 DIAGNOSIS — J45909 Unspecified asthma, uncomplicated: Secondary | ICD-10-CM | POA: Insufficient documentation

## 2016-08-03 DIAGNOSIS — O26893 Other specified pregnancy related conditions, third trimester: Principal | ICD-10-CM | POA: Insufficient documentation

## 2016-08-03 DIAGNOSIS — O34219 Maternal care for unspecified type scar from previous cesarean delivery: Secondary | ICD-10-CM | POA: Insufficient documentation

## 2016-08-03 DIAGNOSIS — Z79899 Other long term (current) drug therapy: Secondary | ICD-10-CM | POA: Insufficient documentation

## 2016-08-03 DIAGNOSIS — R109 Unspecified abdominal pain: Secondary | ICD-10-CM

## 2016-08-03 DIAGNOSIS — Z3A28 28 weeks gestation of pregnancy: Secondary | ICD-10-CM | POA: Insufficient documentation

## 2016-08-04 DIAGNOSIS — O26893 Other specified pregnancy related conditions, third trimester: Secondary | ICD-10-CM | POA: Diagnosis not present

## 2016-08-04 DIAGNOSIS — O99513 Diseases of the respiratory system complicating pregnancy, third trimester: Secondary | ICD-10-CM | POA: Diagnosis not present

## 2016-08-04 DIAGNOSIS — O26899 Other specified pregnancy related conditions, unspecified trimester: Secondary | ICD-10-CM | POA: Diagnosis present

## 2016-08-04 DIAGNOSIS — R109 Unspecified abdominal pain: Secondary | ICD-10-CM

## 2016-08-04 DIAGNOSIS — Z3A28 28 weeks gestation of pregnancy: Secondary | ICD-10-CM | POA: Diagnosis not present

## 2016-08-04 DIAGNOSIS — O34219 Maternal care for unspecified type scar from previous cesarean delivery: Secondary | ICD-10-CM | POA: Diagnosis not present

## 2016-08-04 DIAGNOSIS — Z79899 Other long term (current) drug therapy: Secondary | ICD-10-CM | POA: Diagnosis not present

## 2016-08-04 DIAGNOSIS — R1030 Lower abdominal pain, unspecified: Secondary | ICD-10-CM | POA: Diagnosis not present

## 2016-08-04 DIAGNOSIS — J45909 Unspecified asthma, uncomplicated: Secondary | ICD-10-CM | POA: Diagnosis not present

## 2016-08-04 LAB — URINALYSIS, ROUTINE W REFLEX MICROSCOPIC
BILIRUBIN URINE: NEGATIVE
GLUCOSE, UA: NEGATIVE mg/dL
HGB URINE DIPSTICK: NEGATIVE
KETONES UR: NEGATIVE mg/dL
Leukocytes, UA: NEGATIVE
Nitrite: NEGATIVE
Protein, ur: NEGATIVE mg/dL
Specific Gravity, Urine: 1.014 (ref 1.005–1.030)
pH: 6 (ref 5.0–8.0)

## 2016-08-04 LAB — CBC
HEMATOCRIT: 32.6 % — AB (ref 35.0–47.0)
Hemoglobin: 11.4 g/dL — ABNORMAL LOW (ref 12.0–16.0)
MCH: 29.3 pg (ref 26.0–34.0)
MCHC: 35 g/dL (ref 32.0–36.0)
MCV: 83.7 fL (ref 80.0–100.0)
PLATELETS: 201 10*3/uL (ref 150–440)
RBC: 3.9 MIL/uL (ref 3.80–5.20)
RDW: 14.3 % (ref 11.5–14.5)
WBC: 8.8 10*3/uL (ref 3.6–11.0)

## 2016-08-04 MED ORDER — ACETAMINOPHEN 325 MG PO TABS
650.0000 mg | ORAL_TABLET | Freq: Four times a day (QID) | ORAL | Status: DC | PRN
  Administered 2016-08-04: 650 mg via ORAL
  Filled 2016-08-04: qty 2

## 2016-08-04 NOTE — Progress Notes (Signed)
Pt arrived to [facility] triage unit with c/o lower abdominal pain that was present x2 day but has gotten worse in the last 24 hours, says all day she has been feeling pain in lower abdomen, comes and goes q 5 mins, rates pain 7/10. Denies taking any otc med for pain. Says she had twins via c/s and was last seen and treated with Clindamycin for BV 2 weeks ago, has 2 pills left to take for complete full treatment course. C/o heartburn and felt nauseated and vomited x1 in the morning about 11a, has not eaten or drank much today, last drank fluids around 2p. Pt mentions she felt chills and feverish before coming to the hospital tonight. Denies urinary symptoms, vaginal bleeding, spotting, leaking or gush of fluid. Says she was not feeling baby move like normal but confirms baby is moving now.

## 2016-08-04 NOTE — Discharge Summary (Signed)
Janet EdingerSylvia [name] is a 31 y.o. female. She is at 5038w3d gestation. Patient's last menstrual period was 01/18/2016. Estimated Date of Delivery: 10/24/16  [facility]  Chief complaint: intermittent abdominal pain  Location: abdomen Onset/timing: 2 days ago, every 5 minutes Duration: 2 days Quality: colicky pain Severity: moderate to severe Aggravating or alleviating conditions: nothing Associated signs/symptoms: some chills  Context: Pt arrived to [facility] triage unit with c/o lower abdominal pain that was present x2 day but has gotten worse in the last 24 hours, says all day she has been feeling pain in lower abdomen, comes and goes q 5 mins, rates pain 7/10. Denies taking any otc med for pain. Says she had twins via c/s and was last seen and treated with Clindamycin for BV 2 weeks ago, has 2 pills left to take for complete full treatment course. C/o heartburn and felt nauseated and vomited x1 in the morning about 11a, has not eaten or drank much today, last drank fluids around 2p. Pt mentions she felt chills and feverish before coming to the hospital tonight. Denies urinary symptoms, vaginal bleeding, spotting, leaking or gush of fluid. Says she was not feeling baby move like normal but confirms baby is moving now.  Patient was sleeping upon entering the room.  S: Resting comfortably. no CTX, no VB.no LOF,  Active fetal movement  Maternal Medical History:   Past Medical History:  Diagnosis Date  . Asthma   . Depression   . Headache    migraines    Past Surgical History:  Procedure Laterality Date  . CESAREAN SECTION    . DILATION AND CURETTAGE OF UTERUS      Allergies  Allergen Reactions  . Flagyl [Metronidazole] Itching  . Nsaids Swelling    Facial swelling and temporary blindness   . Zofran [Ondansetron Hcl] Other (See Comments)    Pt states "it made me crazy"  . Levaquin [Levofloxacin In D5w] Rash    Prior to Admission medications    Medication Sig Start Date End Date Taking? Authorizing Provider  albuterol (PROVENTIL HFA;VENTOLIN HFA) 108 (90 Base) MCG/ACT inhaler Inhale 2 puffs into the lungs every 6 (six) hours as needed for wheezing or shortness of breath. 07/24/16  Yes Anyanwu, Jethro BastosUgonna A, MD  Butalbital-APAP-Caffeine 650741001150-325-40 MG capsule 1 tab po 2-3x/week at the most for severe HA 06/12/16  Yes [place] BingPickens, Charlie, MD  clindamycin (CLEOCIN) 300 MG capsule Take 1 capsule (300 mg total) by mouth 2 (two) times daily. For seven days 07/28/16  Yes Anyanwu, Jethro BastosUgonna A, MD  Fluticasone-Salmeterol (ADVAIR DISKUS) 100-50 MCG/DOSE AEPB Inhale 1 puff into the lungs 2 (two) times daily. 07/18/16 07/18/17 Yes Ercia Crisafulli, Elenora Fenderhelsea C, MD  Prenatal Multivit-Min-Fe-FA (PRENATAL VITAMINS) 0.8 MG tablet Take 1 tablet by mouth daily. 08/01/15  Yes Sharman CheekStafford, Phillip, MD  Blood Glucose Monitoring Suppl (ACCU-CHEK AVIVA) device Use as instructed 07/28/16 07/28/17  Anyanwu, Jethro BastosUgonna A, MD  famotidine (PEPCID) 40 MG/5ML suspension Take 2.5 mLs (20 mg total) by mouth 2 (two) times daily. Patient not taking: Reported on 07/24/2016 06/27/16   [place] BingPickens, Charlie, MD  glucose blood (ACCU-CHEK AVIVA) test strip Use as instructed to check blood sugars 4 times daily 07/28/16   Anyanwu, Jethro BastosUgonna A, MD  Lancets (ACCU-CHEK SOFT TOUCH) lancets Use as instructed to check blood sugars 4 times daily. 07/28/16   Anyanwu, Jethro BastosUgonna A, MD  montelukast (SINGULAIR) 10 MG tablet Take 1 tablet (10 mg total) by mouth at bedtime. Patient not taking: Reported on  08/03/2016 07/24/16   Tereso Newcomer, MD     Social History: She  reports that she has never smoked. She has never used smokeless tobacco. She reports that she does not drink alcohol or use drugs.  Family History: family history includes Cancer in her maternal aunt; Diabetes in her father; Heart disease in her mother; Hypertension in her mother.   Review of Systems: A full review of systems was performed and negative except as noted in the  HPI.     O:  BP (!) 111/55   Pulse 94   Temp 98.2 F (36.8 C) (Oral)   Ht 5\' 9"  (1.753 m)   Wt 109.8 kg (242 lb)   LMP 01/18/2016   SpO2 99%   BMI 35.74 kg/m  Results for orders placed or performed during the hospital encounter of 08/03/16 (from the past 48 hour(s))  Urinalysis, Routine w reflex microscopic   Collection Time: 08/04/16  1:00 AM  Result Value Ref Range   Color, Urine YELLOW (A) YELLOW   APPearance CLEAR (A) CLEAR   Specific Gravity, Urine 1.014 1.005 - 1.030   pH 6.0 5.0 - 8.0   Glucose, UA NEGATIVE NEGATIVE mg/dL   Hgb urine dipstick NEGATIVE NEGATIVE   Bilirubin Urine NEGATIVE NEGATIVE   Ketones, ur NEGATIVE NEGATIVE mg/dL   Protein, ur NEGATIVE NEGATIVE mg/dL   Nitrite NEGATIVE NEGATIVE   Leukocytes, UA NEGATIVE NEGATIVE  CBC   Collection Time: 08/04/16  1:11 AM  Result Value Ref Range   WBC 8.8 3.6 - 11.0 K/uL   RBC 3.90 3.80 - 5.20 MIL/uL   Hemoglobin 11.4 (L) 12.0 - 16.0 g/dL   HCT 16.1 (L) 09.6 - 04.5 %   MCV 83.7 80.0 - 100.0 fL   MCH 29.3 26.0 - 34.0 pg   MCHC 35.0 32.0 - 36.0 g/dL   RDW 40.9 81.1 - 91.4 %   Platelets 201 150 - 440 K/uL     Constitutional: NAD, AAOx3  HE/ENT: extraocular movements grossly intact, moist mucous membranes CV: RRR PULM: nl respiratory effort, CTABL     Abd: gravid, non-tender, non-distended, soft      Ext: Non-tender, Nonedmeatous   Psych: mood appropriate, speech normal Pelvic deferred  NST:  Baseline: 140 Variability: moderate Accelerations present x >2 Decelerations absent Time  Toco:  NO CONTRACTIONS TRACED OR PALPATED  A/P: 31 y.o. [redacted]w[redacted]d here for resolving abdominal pain  Labor: not present.   Fetal Wellbeing: Reassuring Cat 1 tracing.  Reactive NST   D/c home stable, precautions reviewed, follow-up with routine OB provider within 48hrs.   ----- Ranae Plumber, MD Attending Obstetrician and Gynecologist [facility], Department of OB/GYN [facility]

## 2016-08-04 NOTE — Progress Notes (Signed)
RN at bedside to reassess pt, states pain is less, rates 4/10, clicking button to indicate when feeling lower abdominal pain every 6-15 mins, s/o at bedside asked if pt will have ultrasound performed. FHT remains reactive for gestational age. Lab results reviewed, wnl. Explained to pt and s/o, will give Dr Elesa MassedWard a call to provide update with lab results, and discuss plan further.

## 2016-08-04 NOTE — OB Triage Note (Signed)
CBC and UA results reviewed with pt, wnl. Fetal tracing remains reactive, reassuring. Discharge teaching with preterm labor precautions reviewed with pt and s/o. Explained to pt that with hx prior c-section, pain in lower abd could be related to baby growing, moving around and scar tissue from prior c-section for twins. Advised pt to try pregnancy support belt, encouraged drinking fluids throughout the day and night to stay well hydrated. Avoid strenuous activity, rest, try warm shower, lying on left side, pelvic rest, Tylenol as directed on label. Stressed importance of f/u with primary OB within 48 hours of triage visit. Pt and s/o verbalized understanding and agreement.

## 2016-08-04 NOTE — Progress Notes (Signed)
Pt given another cup of water and ice to po hydrate. Pt tolerating po well.

## 2016-08-04 NOTE — Progress Notes (Signed)
[id number] - Dr Elesa MassedWard returned page, made aware of lab results, pt clicking marker q5-15 mins, contractions not palpating or tracing, pt says pain in lower abdomen is less now than when she came to hospital. Per Dr Elesa MassedWard, pt may be discharged home, would like pt to call and schedule an appt to be seen by primary OB in next 48 hrs. Pt may have Tylenol 650mg  as requested prior to discharge.

## 2016-08-04 NOTE — Progress Notes (Signed)
In room to see pt and provide discharge teaching. Pt sitting upright in bed eat fastfood provided by her s/o. Pt reports still having lower abdominal pain but now pain is "mild discomfort" Tylenol given po. Discharge paperwork and instructions provided to pt. VSS, afebrile. Questions addressed. Pt denies any needs or concerns.

## 2016-08-06 ENCOUNTER — Encounter: Payer: Medicaid Other | Attending: Obstetrics & Gynecology | Admitting: Registered"

## 2016-08-06 DIAGNOSIS — Z713 Dietary counseling and surveillance: Secondary | ICD-10-CM | POA: Diagnosis not present

## 2016-08-06 DIAGNOSIS — O24410 Gestational diabetes mellitus in pregnancy, diet controlled: Secondary | ICD-10-CM | POA: Insufficient documentation

## 2016-08-06 DIAGNOSIS — R7309 Other abnormal glucose: Secondary | ICD-10-CM

## 2016-08-09 ENCOUNTER — Inpatient Hospital Stay (HOSPITAL_COMMUNITY)
Admission: AD | Admit: 2016-08-09 | Discharge: 2016-08-10 | Disposition: A | Discharge status: Home / Self Care | Payer: Medicaid Other | Source: Ambulatory Visit | Attending: Obstetrics and Gynecology | Admitting: Obstetrics and Gynecology

## 2016-08-09 ENCOUNTER — Encounter (HOSPITAL_COMMUNITY): Payer: Self-pay | Admitting: *Deleted

## 2016-08-09 DIAGNOSIS — R1030 Lower abdominal pain, unspecified: Secondary | ICD-10-CM | POA: Insufficient documentation

## 2016-08-09 DIAGNOSIS — O26899 Other specified pregnancy related conditions, unspecified trimester: Secondary | ICD-10-CM

## 2016-08-09 DIAGNOSIS — R102 Pelvic and perineal pain: Secondary | ICD-10-CM

## 2016-08-09 DIAGNOSIS — J45909 Unspecified asthma, uncomplicated: Secondary | ICD-10-CM

## 2016-08-09 DIAGNOSIS — O99519 Diseases of the respiratory system complicating pregnancy, unspecified trimester: Secondary | ICD-10-CM

## 2016-08-09 LAB — URINALYSIS, ROUTINE W REFLEX MICROSCOPIC
Bilirubin Urine: NEGATIVE
Glucose, UA: NEGATIVE mg/dL
Hgb urine dipstick: NEGATIVE
KETONES UR: NEGATIVE mg/dL
LEUKOCYTES UA: NEGATIVE
NITRITE: NEGATIVE
PROTEIN: NEGATIVE mg/dL
Specific Gravity, Urine: 1.011 (ref 1.005–1.030)
pH: 6 (ref 5.0–8.0)

## 2016-08-09 NOTE — MAU Note (Signed)
Pt reports she has been feeling lower abd pain that feels like menstrual pain on and off all afternoon and evening. Pt feels SOB as well. Denies any vag bleeding or leaking. Reports fetal movement has been less than usual.

## 2016-08-10 LAB — COMPREHENSIVE METABOLIC PANEL
ALBUMIN: 3.2 g/dL — AB (ref 3.5–5.0)
ALT: 15 U/L (ref 14–54)
ANION GAP: 6 (ref 5–15)
AST: 22 U/L (ref 15–41)
Alkaline Phosphatase: 58 U/L (ref 38–126)
BILIRUBIN TOTAL: 0.8 mg/dL (ref 0.3–1.2)
BUN: 9 mg/dL (ref 6–20)
CALCIUM: 9.2 mg/dL (ref 8.9–10.3)
CO2: 23 mmol/L (ref 22–32)
Chloride: 106 mmol/L (ref 101–111)
Creatinine, Ser: 0.5 mg/dL (ref 0.44–1.00)
GFR calc non Af Amer: >60 mL/min (ref >60)
GLUCOSE: 97 mg/dL (ref 65–99)
POTASSIUM: 3.8 mmol/L (ref 3.5–5.1)
SODIUM: 135 mmol/L (ref 135–145)
TOTAL PROTEIN: 6.2 g/dL — AB (ref 6.5–8.1)

## 2016-08-10 LAB — CBC
HEMATOCRIT: 32.1 % — AB (ref 36.0–46.0)
Hemoglobin: 11.1 g/dL — ABNORMAL LOW (ref 12.0–15.0)
MCH: 28.2 pg (ref 26.0–34.0)
MCHC: 34.6 g/dL (ref 30.0–36.0)
MCV: 81.7 fL (ref 78.0–100.0)
Platelets: 203 10*3/uL (ref 150–400)
RBC: 3.93 MIL/uL (ref 3.87–5.11)
RDW: 13.8 % (ref 11.5–15.5)
WBC: 7.5 10*3/uL (ref 4.0–10.5)

## 2016-08-10 MED ORDER — BUTALBITAL-APAP-CAFFEINE 50-325-40 MG PO TABS
1.0000 | ORAL_TABLET | ORAL | Status: DC | PRN
Start: 2016-08-10 — End: 2016-08-10
  Administered 2016-08-10: 1 via ORAL
  Filled 2016-08-10: qty 1

## 2016-08-10 MED ORDER — ACETAMINOPHEN 500 MG PO TABS: 1000.0000 mg | ORAL_TABLET | Freq: Once | ORAL | Status: DC

## 2016-08-10 NOTE — Discharge Instructions (Signed)
Abdominal Pain During Pregnancy Belly (abdominal) pain is common during pregnancy. Most of the time, it is not a serious problem. Other times, it can be a sign that something is wrong with the pregnancy. Always tell your doctor if you have belly pain. Follow these instructions at home: Monitor your belly pain for any changes. The following actions may help you feel better:  Do not have sex (intercourse) or put anything in your vagina until you feel better.  Rest until your pain stops.  Drink clear fluids if you feel sick to your stomach (nauseous). Do not eat solid food until you feel better.  Only take medicine as told by your doctor.  Keep all doctor visits as told.  Get help right away if:  You are bleeding, leaking fluid, or pieces of tissue come out of your vagina.  You have more pain or cramping.  You keep throwing up (vomiting).  You have pain when you pee (urinate) or have blood in your pee.  You have a fever.  You do not feel your baby moving as much.  You feel very weak or feel like passing out.  You have trouble breathing, with or without belly pain.  You have a very bad headache and belly pain.  You have fluid leaking from your vagina and belly pain.  You keep having watery poop (diarrhea).  Your belly pain does not go away after resting, or the pain gets worse. This information is not intended to replace advice given to you by your health care provider. Make sure you discuss any questions you have with your health care provider. Document Released: 12/18/2008 Document Revised: 08/08/2015 Document Reviewed: 07/29/2012 Elsevier Interactive Patient Education  2018 [employer]   Wear a pregnancy support belt, which can be purchased at [employer] health stores or online.  This may help support the uterus and improve pain.

## 2016-08-11 ENCOUNTER — Encounter: Payer: Self-pay | Admitting: Registered"

## 2016-08-11 NOTE — Progress Notes (Signed)
Patient was seen on 08/06/2016 for Gestational Diabetes self-management class at [facility]. The following learning objectives were met by the patient during this course:   States the definition of Gestational Diabetes  States why dietary management is important in controlling blood glucose  Describes the effects each nutrient has on blood glucose levels  Demonstrates ability to create a balanced meal plan  Demonstrates carbohydrate counting   States when to check blood glucose levels  Demonstrates proper blood glucose monitoring techniques  States the effect of stress and exercise on blood glucose levels  States the importance of limiting caffeine and abstaining from alcohol and smoking  Blood glucose monitor given: none Lot # n/a Exp: n/a Blood glucose reading: 91  Patient instructed to monitor glucose levels: FBS: 60 - <95 1 hour: <140 2 hour: <120  Patient received handouts:  Nutrition Diabetes and Pregnancy  Carbohydrate Counting List  Patient will be seen for follow-up as needed.

## 2016-08-12 ENCOUNTER — Ambulatory Visit (INDEPENDENT_AMBULATORY_CARE_PROVIDER_SITE_OTHER): Payer: Medicaid Other | Admitting: Obstetrics & Gynecology

## 2016-08-12 DIAGNOSIS — O24419 Gestational diabetes mellitus in pregnancy, unspecified control: Secondary | ICD-10-CM

## 2016-08-12 DIAGNOSIS — O24410 Gestational diabetes mellitus in pregnancy, diet controlled: Secondary | ICD-10-CM

## 2016-08-12 DIAGNOSIS — O0993 Supervision of high risk pregnancy, unspecified, third trimester: Secondary | ICD-10-CM

## 2016-08-12 HISTORY — DX: Gestational diabetes mellitus in pregnancy, unspecified control: O24.419

## 2016-08-12 NOTE — Progress Notes (Signed)
ro

## 2016-08-12 NOTE — Progress Notes (Signed)
   PRENATAL VISIT NOTE  Subjective:  Janet [name] is a 31 y.o. G3P1012 at 3587w4d being seen today for ongoing prenatal care.  She is currently monitored for the following issues for this high-risk pregnancy and has Supervision of high-risk pregnancy; History of gestational hypertension; History of cesarean delivery; Obesity in pregnancy; Sickle cell trait (HCC); Migraine; Obesity, Class II, BMI 35-39.9; Asthma affecting pregnancy, antepartum; Abdominal pain in pregnancy; and Gestational diabetes mellitus, antepartum on her problem list.  Patient reports pelvic pain occasionally; had negative evaluation in ED/MAU.  Contractions: Irregular. Vag. Bleeding: None.  Movement: Present. Denies leaking of fluid.   The following portions of the patient's history were reviewed and updated as appropriate: allergies, current medications, past family history, past medical history, past social history, past surgical history and problem list. Problem list updated.  Objective:   Vitals:   08/12/16 1528  BP: 110/68  Pulse: (!) 101  Weight: 242 lb (109.8 kg)    Fetal Status: Fetal Heart Rate (bpm): 152 Fundal Height: 30 cm Movement: Present     General:  Alert, oriented and cooperative. Patient is in no acute distress.  Skin: Skin is warm and dry. No rash noted.   Cardiovascular: Normal heart rate noted  Respiratory: Normal respiratory effort, no problems with respiration noted  Abdomen: Soft, gravid, appropriate for gestational age.  Pain/Pressure: Present     Pelvic: Cervical exam deferred        Extremities: Normal range of motion.  Edema: None  Mental Status:  Normal mood and affect. Normal behavior. Normal judgment and thought content.   Assessment and Plan:  Pregnancy: G3P1012 at 2187w4d  1. Gestational diabetes mellitus (GDM), antepartum, diet controlled CBGs reviewed and are within range.  Continue diet and exercise  2. Supervision of high risk pregnancy in third trimester Desires RCS, to be  scheduled at 39 weeks. Preterm labor symptoms and general obstetric precautions including but not limited to vaginal bleeding, contractions, leaking of fluid and fetal movement were reviewed in detail with the patient. Please refer to After Visit Summary for other counseling recommendations.  Return in about 2 weeks (around 08/26/2016) for OB Visit.   Jaynie CollinsUgonna Maude Hettich, MD

## 2016-08-12 NOTE — Patient Instructions (Signed)
Return to clinic for any scheduled appointments or obstetric concerns, or go to MAU for evaluation  

## 2016-08-20 ENCOUNTER — Ambulatory Visit (HOSPITAL_COMMUNITY)
Admission: RE | Admit: 2016-08-20 | Discharge: 2016-08-20 | Disposition: A | Discharge status: Home / Self Care | Payer: Medicaid Other | Source: Ambulatory Visit | Attending: Obstetrics & Gynecology | Admitting: Obstetrics & Gynecology

## 2016-08-20 DIAGNOSIS — Z862 Personal history of diseases of the blood and blood-forming organs and certain disorders involving the immune mechanism: Secondary | ICD-10-CM | POA: Insufficient documentation

## 2016-08-20 DIAGNOSIS — Z6835 Body mass index (BMI) 35.0-35.9, adult: Secondary | ICD-10-CM | POA: Diagnosis not present

## 2016-08-20 DIAGNOSIS — O09293 Supervision of pregnancy with other poor reproductive or obstetric history, third trimester: Secondary | ICD-10-CM | POA: Diagnosis not present

## 2016-08-20 DIAGNOSIS — Z048 Encounter for examination and observation for other specified reasons: Secondary | ICD-10-CM | POA: Diagnosis present

## 2016-08-20 DIAGNOSIS — O34219 Maternal care for unspecified type scar from previous cesarean delivery: Secondary | ICD-10-CM | POA: Diagnosis not present

## 2016-08-20 DIAGNOSIS — Z3A30 30 weeks gestation of pregnancy: Secondary | ICD-10-CM | POA: Insufficient documentation

## 2016-08-20 DIAGNOSIS — Z0489 Encounter for examination and observation for other specified reasons: Secondary | ICD-10-CM

## 2016-08-20 DIAGNOSIS — Z362 Encounter for other antenatal screening follow-up: Secondary | ICD-10-CM | POA: Diagnosis not present

## 2016-08-20 DIAGNOSIS — O99213 Obesity complicating pregnancy, third trimester: Secondary | ICD-10-CM | POA: Insufficient documentation

## 2016-08-20 DIAGNOSIS — IMO0002 Reserved for concepts with insufficient information to code with codable children: Secondary | ICD-10-CM

## 2016-08-22 NOTE — Progress Notes (Signed)
[employer] pt - recently moved to GDM program - She is not able to get CBG results in [facility] app. Gave pt [facility] tech support number to call to get app updated with GDM program.  Refilled famotidine per pt req. She states it is working.

## 2016-08-26 ENCOUNTER — Ambulatory Visit (INDEPENDENT_AMBULATORY_CARE_PROVIDER_SITE_OTHER): Payer: Medicaid Other | Admitting: Family Medicine

## 2016-08-26 ENCOUNTER — Encounter (HOSPITAL_COMMUNITY): Payer: Self-pay

## 2016-08-26 VITALS — BP 108/71 | HR 87 | Wt 241.0 lb

## 2016-08-26 DIAGNOSIS — O24410 Gestational diabetes mellitus in pregnancy, diet controlled: Secondary | ICD-10-CM

## 2016-08-26 DIAGNOSIS — O0993 Supervision of high risk pregnancy, unspecified, third trimester: Secondary | ICD-10-CM

## 2016-08-26 DIAGNOSIS — O34219 Maternal care for unspecified type scar from previous cesarean delivery: Secondary | ICD-10-CM

## 2016-08-26 DIAGNOSIS — Z98891 History of uterine scar from previous surgery: Secondary | ICD-10-CM

## 2016-08-26 MED ORDER — FAMOTIDINE 40 MG/5ML PO SUSR: 20.0000 mg | Freq: Two times a day (BID) | ORAL | 2 refills | Status: DC

## 2016-08-26 NOTE — Patient Instructions (Signed)
 Third Trimester of Pregnancy The third trimester is from week 28 through week 40 (months 7 through 9). The third trimester is a time when the unborn baby (fetus) is growing rapidly. At the end of the ninth month, the fetus is about 20 inches in length and weighs 6-10 pounds. Body changes during your third trimester Your body will continue to go through many changes during pregnancy. The changes vary from woman to woman. During the third trimester:  Your weight will continue to increase. You can expect to gain 25-35 pounds (11-16 kg) by the end of the pregnancy.  You may begin to get stretch marks on your hips, abdomen, and breasts.  You may urinate more often because the fetus is moving lower into your pelvis and pressing on your bladder.  You may develop or continue to have heartburn. This is caused by increased hormones that slow down muscles in the digestive tract.  You may develop or continue to have constipation because increased hormones slow digestion and cause the muscles that push waste through your intestines to relax.  You may develop hemorrhoids. These are swollen veins (varicose veins) in the rectum that can itch or be painful.  You may develop swollen, bulging veins (varicose veins) in your legs.  You may have increased body aches in the pelvis, back, or thighs. This is due to weight gain and increased hormones that are relaxing your joints.  You may have changes in your hair. These can include thickening of your hair, rapid growth, and changes in texture. Some women also have hair loss during or after pregnancy, or hair that feels dry or thin. Your hair will most likely return to normal after your baby is born.  Your breasts will continue to grow and they will continue to become tender. A yellow fluid (colostrum) may leak from your breasts. This is the first milk you are producing for your baby.  Your belly button may stick out.  You may notice more swelling in your  hands, face, or ankles.  You may have increased tingling or numbness in your hands, arms, and legs. The skin on your belly may also feel numb.  You may feel short of breath because of your expanding uterus.  You may have more problems sleeping. This can be caused by the size of your belly, increased need to urinate, and an increase in your body's metabolism.  You may notice the fetus "dropping," or moving lower in your abdomen (lightening).  You may have increased vaginal discharge.  You may notice your joints feel loose and you may have pain around your pelvic bone.  What to expect at prenatal visits You will have prenatal exams every 2 weeks until week 36. Then you will have weekly prenatal exams. During a routine prenatal visit:  You will be weighed to make sure you and the baby are growing normally.  Your blood pressure will be taken.  Your abdomen will be measured to track your baby's growth.  The fetal heartbeat will be listened to.  Any test results from the previous visit will be discussed.  You may have a cervical check near your due date to see if your cervix has softened or thinned (effaced).  You will be tested for Group B streptococcus. This happens between 35 and 37 weeks.  Your health care provider may ask you:  What your birth plan is.  How you are feeling.  If you are feeling the baby move.  If you have   had any abnormal symptoms, such as leaking fluid, bleeding, severe headaches, or abdominal cramping.  If you are using any tobacco products, including cigarettes, chewing tobacco, and electronic cigarettes.  If you have any questions.  Other tests or screenings that may be performed during your third trimester include:  Blood tests that check for low iron levels (anemia).  Fetal testing to check the health, activity level, and growth of the fetus. Testing is done if you have certain medical conditions or if there are problems during the  pregnancy.  Nonstress test (NST). This test checks the health of your baby to make sure there are no signs of problems, such as the baby not getting enough oxygen. During this test, a belt is placed around your belly. The baby is made to move, and its heart rate is monitored during movement.  What is false labor? False labor is a condition in which you feel small, irregular tightenings of the muscles in the womb (contractions) that usually go away with rest, changing position, or drinking water. These are called Braxton Hicks contractions. Contractions may last for hours, days, or even weeks before true labor sets in. If contractions come at regular intervals, become more frequent, increase in intensity, or become painful, you should see your health care provider. What are the signs of labor?  Abdominal cramps.  Regular contractions that start at 10 minutes apart and become stronger and more frequent with time.  Contractions that start on the top of the uterus and spread down to the lower abdomen and back.  Increased pelvic pressure and dull back pain.  A watery or bloody mucus discharge that comes from the vagina.  Leaking of amniotic fluid. This is also known as your "water breaking." It could be a slow trickle or a gush. Let your health care provider know if it has a color or strange odor. If you have any of these signs, call your health care provider right away, even if it is before your due date. Follow these instructions at home: Medicines  Follow your health care provider's instructions regarding medicine use. Specific medicines may be either safe or unsafe to take during pregnancy.  Take a prenatal vitamin that contains at least 600 micrograms (mcg) of folic acid.  If you develop constipation, try taking a stool softener if your health care provider approves. Eating and drinking  Eat a balanced diet that includes fresh fruits and vegetables, whole grains, good sources of protein  such as meat, eggs, or tofu, and low-fat dairy. Your health care provider will help you determine the amount of weight gain that is right for you.  Avoid raw meat and uncooked cheese. These carry germs that can cause birth defects in the baby.  If you have low calcium intake from food, talk to your health care provider about whether you should take a daily calcium supplement.  Eat four or five small meals rather than three large meals a day.  Limit foods that are high in fat and processed sugars, such as fried and sweet foods.  To prevent constipation: ? Drink enough fluid to keep your urine clear or pale yellow. ? Eat foods that are high in fiber, such as fresh fruits and vegetables, whole grains, and beans. Activity  Exercise only as directed by your health care provider. Most women can continue their usual exercise routine during pregnancy. Try to exercise for 30 minutes at least 5 days a week. Stop exercising if you experience uterine contractions.  Avoid   heavy lifting.  Do not exercise in extreme heat or humidity, or at high altitudes.  Wear low-heel, comfortable shoes.  Practice good posture.  You may continue to have sex unless your health care provider tells you otherwise. Relieving pain and discomfort  Take frequent breaks and rest with your legs elevated if you have leg cramps or low back pain.  Take warm sitz baths to soothe any pain or discomfort caused by hemorrhoids. Use hemorrhoid cream if your health care provider approves.  Wear a good support bra to prevent discomfort from breast tenderness.  If you develop varicose veins: ? Wear support pantyhose or compression stockings as told by your healthcare provider. ? Elevate your feet for 15 minutes, 3-4 times a day. Prenatal care  Write down your questions. Take them to your prenatal visits.  Keep all your prenatal visits as told by your health care provider. This is important. Safety  Wear your seat belt at  all times when driving.  Make a list of emergency phone numbers, including numbers for family, friends, the hospital, and police and fire departments. General instructions  Avoid cat litter boxes and soil used by cats. These carry germs that can cause birth defects in the baby. If you have a cat, ask someone to clean the litter box for you.  Do not travel far distances unless it is absolutely necessary and only with the approval of your health care provider.  Do not use hot tubs, steam rooms, or saunas.  Do not drink alcohol.  Do not use any products that contain nicotine or tobacco, such as cigarettes and e-cigarettes. If you need help quitting, ask your health care provider.  Do not use any medicinal herbs or unprescribed drugs. These chemicals affect the formation and growth of the baby.  Do not douche or use tampons or scented sanitary pads.  Do not cross your legs for long periods of time.  To prepare for the arrival of your baby: ? Take prenatal classes to understand, practice, and ask questions about labor and delivery. ? Make a trial run to the hospital. ? Visit the hospital and tour the maternity area. ? Arrange for maternity or paternity leave through employers. ? Arrange for family and friends to take care of pets while you are in the hospital. ? Purchase a rear-facing car seat and make sure you know how to install it in your car. ? Pack your hospital bag. ? Prepare the baby's nursery. Make sure to remove all pillows and stuffed animals from the baby's crib to prevent suffocation.  Visit your dentist if you have not gone during your pregnancy. Use a soft toothbrush to brush your teeth and be gentle when you floss. Contact a health care provider if:  You are unsure if you are in labor or if your water has broken.  You become dizzy.  You have mild pelvic cramps, pelvic pressure, or nagging pain in your abdominal area.  You have lower back pain.  You have persistent  nausea, vomiting, or diarrhea.  You have an unusual or bad smelling vaginal discharge.  You have pain when you urinate. Get help right away if:  Your water breaks before 37 weeks.  You have regular contractions less than 5 minutes apart before 37 weeks.  You have a fever.  You are leaking fluid from your vagina.  You have spotting or bleeding from your vagina.  You have severe abdominal pain or cramping.  You have rapid weight loss or weight   gain.  You have shortness of breath with chest pain.  You notice sudden or extreme swelling of your face, hands, ankles, feet, or legs.  Your baby makes fewer than 10 movements in 2 hours.  You have severe headaches that do not go away when you take medicine.  You have vision changes. Summary  The third trimester is from week 28 through week 40, months 7 through 9. The third trimester is a time when the unborn baby (fetus) is growing rapidly.  During the third trimester, your discomfort may increase as you and your baby continue to gain weight. You may have abdominal, leg, and back pain, sleeping problems, and an increased need to urinate.  During the third trimester your breasts will keep growing and they will continue to become tender. A yellow fluid (colostrum) may leak from your breasts. This is the first milk you are producing for your baby.  False labor is a condition in which you feel small, irregular tightenings of the muscles in the womb (contractions) that eventually go away. These are called Braxton Hicks contractions. Contractions may last for hours, days, or even weeks before true labor sets in.  Signs of labor can include: abdominal cramps; regular contractions that start at 10 minutes apart and become stronger and more frequent with time; watery or bloody mucus discharge that comes from the vagina; increased pelvic pressure and dull back pain; and leaking of amniotic fluid. This information is not intended to replace advice  given to you by your health care provider. Make sure you discuss any questions you have with your health care provider. Document Released: 12/24/2000 Document Revised: 06/07/2015 Document Reviewed: 03/02/2012 [employer] Interactive Patient Education  2017 [employer]   Breastfeeding Deciding to breastfeed is one of the best choices you can make for you and your baby. A change in hormones during pregnancy causes your breast tissue to grow and increases the number and size of your milk ducts. These hormones also allow proteins, sugars, and fats from your blood supply to make breast milk in your milk-producing glands. Hormones prevent breast milk from being released before your baby is born as well as prompt milk flow after birth. Once breastfeeding has begun, thoughts of your baby, as well as his or her sucking or crying, can stimulate the release of milk from your milk-producing glands. Benefits of breastfeeding For Your Baby  Your first milk (colostrum) helps your baby's digestive system function better.  There are antibodies in your milk that help your baby fight off infections.  Your baby has a lower incidence of asthma, allergies, and sudden infant death syndrome.  The nutrients in breast milk are better for your baby than infant formulas and are designed uniquely for your baby's needs.  Breast milk improves your baby's brain development.  Your baby is less likely to develop other conditions, such as childhood obesity, asthma, or type 2 diabetes mellitus.  For You  Breastfeeding helps to create a very special bond between you and your baby.  Breastfeeding is convenient. Breast milk is always available at the correct temperature and costs nothing.  Breastfeeding helps to burn calories and helps you lose the weight gained during pregnancy.  Breastfeeding makes your uterus contract to its prepregnancy size faster and slows bleeding (lochia) after you give birth.  Breastfeeding helps  to lower your risk of developing type 2 diabetes mellitus, osteoporosis, and breast or ovarian cancer later in life.  Signs that your baby is hungry Early Signs of Hunger    Increased alertness or activity.  Stretching.  Movement of the head from side to side.  Movement of the head and opening of the mouth when the corner of the mouth or cheek is stroked (rooting).  Increased sucking sounds, smacking lips, cooing, sighing, or squeaking.  Hand-to-mouth movements.  Increased sucking of fingers or hands.  Late Signs of Hunger  Fussing.  Intermittent crying.  Extreme Signs of Hunger Signs of extreme hunger will require calming and consoling before your baby will be able to breastfeed successfully. Do not wait for the following signs of extreme hunger to occur before you initiate breastfeeding:  Restlessness.  A loud, strong cry.  Screaming.  Breastfeeding basics Breastfeeding Initiation  Find a comfortable place to sit or lie down, with your neck and back well supported.  Place a pillow or rolled up blanket under your baby to bring him or her to the level of your breast (if you are seated). Nursing pillows are specially designed to help support your arms and your baby while you breastfeed.  Make sure that your baby's abdomen is facing your abdomen.  Gently massage your breast. With your fingertips, massage from your chest wall toward your nipple in a circular motion. This encourages milk flow. You may need to continue this action during the feeding if your milk flows slowly.  Support your breast with 4 fingers underneath and your thumb above your nipple. Make sure your fingers are well away from your nipple and your baby's mouth.  Stroke your baby's lips gently with your finger or nipple.  When your baby's mouth is open wide enough, quickly bring your baby to your breast, placing your entire nipple and as much of the colored area around your nipple (areola) as possible into  your baby's mouth. ? More areola should be visible above your baby's upper lip than below the lower lip. ? Your baby's tongue should be between his or her lower gum and your breast.  Ensure that your baby's mouth is correctly positioned around your nipple (latched). Your baby's lips should create a seal on your breast and be turned out (everted).  It is common for your baby to suck about 2-3 minutes in order to start the flow of breast milk.  Latching Teaching your baby how to latch on to your breast properly is very important. An improper latch can cause nipple pain and decreased milk supply for you and poor weight gain in your baby. Also, if your baby is not latched onto your nipple properly, he or she may swallow some air during feeding. This can make your baby fussy. Burping your baby when you switch breasts during the feeding can help to get rid of the air. However, teaching your baby to latch on properly is still the best way to prevent fussiness from swallowing air while breastfeeding. Signs that your baby has successfully latched on to your nipple:  Silent tugging or silent sucking, without causing you pain.  Swallowing heard between every 3-4 sucks.  Muscle movement above and in front of his or her ears while sucking.  Signs that your baby has not successfully latched on to nipple:  Sucking sounds or smacking sounds from your baby while breastfeeding.  Nipple pain.  If you think your baby has not latched on correctly, slip your finger into the corner of your baby's mouth to break the suction and place it between your baby's gums. Attempt breastfeeding initiation again. Signs of Successful Breastfeeding Signs from your baby:  A   gradual decrease in the number of sucks or complete cessation of sucking.  Falling asleep.  Relaxation of his or her body.  Retention of a small amount of milk in his or her mouth.  Letting go of your breast by himself or herself.  Signs from  you:  Breasts that have increased in firmness, weight, and size 1-3 hours after feeding.  Breasts that are softer immediately after breastfeeding.  Increased milk volume, as well as a change in milk consistency and color by the fifth day of breastfeeding.  Nipples that are not sore, cracked, or bleeding.  Signs That Your Baby is Getting Enough Milk  Wetting at least 1-2 diapers during the first 24 hours after birth.  Wetting at least 5-6 diapers every 24 hours for the first week after birth. The urine should be clear or pale yellow by 5 days after birth.  Wetting 6-8 diapers every 24 hours as your baby continues to grow and develop.  At least 3 stools in a 24-hour period by age 5 days. The stool should be soft and yellow.  At least 3 stools in a 24-hour period by age 7 days. The stool should be seedy and yellow.  No loss of weight greater than 10% of birth weight during the first 3 days of age.  Average weight gain of 4-7 ounces (113-198 g) per week after age 4 days.  Consistent daily weight gain by age 5 days, without weight loss after the age of 2 weeks.  After a feeding, your baby may spit up a small amount. This is common. Breastfeeding frequency and duration Frequent feeding will help you make more milk and can prevent sore nipples and breast engorgement. Breastfeed when you feel the need to reduce the fullness of your breasts or when your baby shows signs of hunger. This is called "breastfeeding on demand." Avoid introducing a pacifier to your baby while you are working to establish breastfeeding (the first 4-6 weeks after your baby is born). After this time you may choose to use a pacifier. Research has shown that pacifier use during the first year of a baby's life decreases the risk of sudden infant death syndrome (SIDS). Allow your baby to feed on each breast as long as he or she wants. Breastfeed until your baby is finished feeding. When your baby unlatches or falls asleep  while feeding from the first breast, offer the second breast. Because newborns are often sleepy in the first few weeks of life, you may need to awaken your baby to get him or her to feed. Breastfeeding times will vary from baby to baby. However, the following rules can serve as a guide to help you ensure that your baby is properly fed:  Newborns (babies 4 weeks of age or younger) may breastfeed every 1-3 hours.  Newborns should not go longer than 3 hours during the day or 5 hours during the night without breastfeeding.  You should breastfeed your baby a minimum of 8 times in a 24-hour period until you begin to introduce solid foods to your baby at around 6 months of age.  Breast milk pumping Pumping and storing breast milk allows you to ensure that your baby is exclusively fed your breast milk, even at times when you are unable to breastfeed. This is especially important if you are going back to work while you are still breastfeeding or when you are not able to be present during feedings. Your lactation consultant can give you guidelines on how   long it is safe to store breast milk. A breast pump is a machine that allows you to pump milk from your breast into a sterile bottle. The pumped breast milk can then be stored in a refrigerator or freezer. Some breast pumps are operated by hand, while others use electricity. Ask your lactation consultant which type will work best for you. Breast pumps can be purchased, but some hospitals and breastfeeding support groups lease breast pumps on a monthly basis. A lactation consultant can teach you how to hand express breast milk, if you prefer not to use a pump. Caring for your breasts while you breastfeed Nipples can become dry, cracked, and sore while breastfeeding. The following recommendations can help keep your breasts moisturized and healthy:  Avoid using soap on your nipples.  Wear a supportive bra. Although not required, special nursing bras and tank  tops are designed to allow access to your breasts for breastfeeding without taking off your entire bra or top. Avoid wearing underwire-style bras or extremely tight bras.  Air dry your nipples for 3-4minutes after each feeding.  Use only cotton bra pads to absorb leaked breast milk. Leaking of breast milk between feedings is normal.  Use lanolin on your nipples after breastfeeding. Lanolin helps to maintain your skin's normal moisture barrier. If you use pure lanolin, you do not need to wash it off before feeding your baby again. Pure lanolin is not toxic to your baby. You may also hand express a few drops of breast milk and gently massage that milk into your nipples and allow the milk to air dry.  In the first few weeks after giving birth, some women experience extremely full breasts (engorgement). Engorgement can make your breasts feel heavy, warm, and tender to the touch. Engorgement peaks within 3-5 days after you give birth. The following recommendations can help ease engorgement:  Completely empty your breasts while breastfeeding or pumping. You may want to start by applying warm, moist heat (in the shower or with warm water-soaked hand towels) just before feeding or pumping. This increases circulation and helps the milk flow. If your baby does not completely empty your breasts while breastfeeding, pump any extra milk after he or she is finished.  Wear a snug bra (nursing or regular) or tank top for 1-2 days to signal your body to slightly decrease milk production.  Apply ice packs to your breasts, unless this is too uncomfortable for you.  Make sure that your baby is latched on and positioned properly while breastfeeding.  If engorgement persists after 48 hours of following these recommendations, contact your health care provider or a lactation consultant. Overall health care recommendations while breastfeeding  Eat healthy foods. Alternate between meals and snacks, eating 3 of each per  day. Because what you eat affects your breast milk, some of the foods may make your baby more irritable than usual. Avoid eating these foods if you are sure that they are negatively affecting your baby.  Drink milk, fruit juice, and water to satisfy your thirst (about 10 glasses a day).  Rest often, relax, and continue to take your prenatal vitamins to prevent fatigue, stress, and anemia.  Continue breast self-awareness checks.  Avoid chewing and smoking tobacco. Chemicals from cigarettes that pass into breast milk and exposure to secondhand smoke may harm your baby.  Avoid alcohol and drug use, including marijuana. Some medicines that may be harmful to your baby can pass through breast milk. It is important to ask your health care   provider before taking any medicine, including all over-the-counter and prescription medicine as well as vitamin and herbal supplements. It is possible to become pregnant while breastfeeding. If birth control is desired, ask your health care provider about options that will be safe for your baby. Contact a health care provider if:  You feel like you want to stop breastfeeding or have become frustrated with breastfeeding.  You have painful breasts or nipples.  Your nipples are cracked or bleeding.  Your breasts are red, tender, or warm.  You have a swollen area on either breast.  You have a fever or chills.  You have nausea or vomiting.  You have drainage other than breast milk from your nipples.  Your breasts do not become full before feedings by the fifth day after you give birth.  You feel sad and depressed.  Your baby is too sleepy to eat well.  Your baby is having trouble sleeping.  Your baby is wetting less than 3 diapers in a 24-hour period.  Your baby has less than 3 stools in a 24-hour period.  Your baby's skin or the white part of his or her eyes becomes yellow.  Your baby is not gaining weight by 5 days of age. Get help right away  if:  Your baby is overly tired (lethargic) and does not want to wake up and feed.  Your baby develops an unexplained fever. This information is not intended to replace advice given to you by your health care provider. Make sure you discuss any questions you have with your health care provider. Document Released: 12/30/2004 Document Revised: 06/13/2015 Document Reviewed: 06/23/2012 Elsevier Interactive Patient Education  2017 Elsevier Inc.  

## 2016-08-26 NOTE — Progress Notes (Signed)
    PRENATAL VISIT NOTE  Subjective:  Janet [name] is a 31 y.o. G3P1012 at 301w4d being seen today for ongoing prenatal care.  She is currently monitored for the following issues for this high-risk pregnancy and has Supervision of high-risk pregnancy; History of gestational hypertension; History of cesarean delivery; Obesity in pregnancy; Sickle cell trait (HCC); Migraine; Obesity, Class II, BMI 35-39.9; Asthma affecting pregnancy, antepartum; Abdominal pain in pregnancy; and Gestational diabetes mellitus, antepartum on her problem list.  Patient reports no complaints.  Contractions: Not present. Vag. Bleeding: None.  Movement: Present. Denies leaking of fluid.   The following portions of the patient's history were reviewed and updated as appropriate: allergies, current medications, past family history, past medical history, past social history, past surgical history and problem list. Problem list updated.  Objective:   Vitals:   08/26/16 1126  BP: 108/71  Pulse: 87  Weight: 241 lb (109.3 kg)    Fetal Status: Fetal Heart Rate (bpm): 133 Fundal Height: 33 cm Movement: Present     General:  Alert, oriented and cooperative. Patient is in no acute distress.  Skin: Skin is warm and dry. No rash noted.   Cardiovascular: Normal heart rate noted  Respiratory: Normal respiratory effort, no problems with respiration noted  Abdomen: Soft, gravid, appropriate for gestational age.  Pain/Pressure: Present     Pelvic: Cervical exam deferred        Extremities: Normal range of motion.  Edema: None  Mental Status:  Normal mood and affect. Normal behavior. Normal judgment and thought content.  FBS 75-100 (2 are out of range) 2 hour pp 88-118 none out of range U/s 08/21/16 breech, EFW 1569gm 3 lb 7 oz (49%) Assessment and Plan:  Pregnancy: [id number] at 811w4d  1. Diet controlled gestational diabetes mellitus (GDM), antepartum Continue diet-->re-enforced protein with meals, 6 meals and 6 snacks. -  famotidine (PEPCID) 40 MG/5ML suspension; Take 2.5 mLs (20 mg total) by mouth 2 (two) times daily.  Dispense: 50 mL; Refill: 2  2. Supervision of high risk pregnancy in third trimester Continue prenatal care.   3. History of cesarean delivery Booked for Elective repeat at 39 wks. She is very scared of the knife--advised to consider TOLAC.  Preterm labor symptoms and general obstetric precautions including but not limited to vaginal bleeding, contractions, leaking of fluid and fetal movement were reviewed in detail with the patient. Please refer to After Visit Summary for other counseling recommendations.  Return in 2 weeks (on 09/09/2016).   Reva Boresanya S Vinson Tietze, MD

## 2016-09-09 ENCOUNTER — Other Ambulatory Visit: Payer: Self-pay | Admitting: Family Medicine

## 2016-09-09 ENCOUNTER — Ambulatory Visit (INDEPENDENT_AMBULATORY_CARE_PROVIDER_SITE_OTHER): Payer: Medicaid Other | Admitting: Family Medicine

## 2016-09-09 VITALS — BP 113/74 | HR 101 | Wt 244.0 lb

## 2016-09-09 DIAGNOSIS — Z98891 History of uterine scar from previous surgery: Secondary | ICD-10-CM

## 2016-09-09 DIAGNOSIS — O0993 Supervision of high risk pregnancy, unspecified, third trimester: Secondary | ICD-10-CM

## 2016-09-09 DIAGNOSIS — O24410 Gestational diabetes mellitus in pregnancy, diet controlled: Secondary | ICD-10-CM

## 2016-09-09 DIAGNOSIS — N898 Other specified noninflammatory disorders of vagina: Secondary | ICD-10-CM

## 2016-09-09 MED ORDER — TERCONAZOLE 0.8 % VA CREA: 1.0000 | TOPICAL_CREAM | Freq: Every day | VAGINAL | 0 refills | Status: DC

## 2016-09-09 NOTE — Patient Instructions (Signed)
 Third Trimester of Pregnancy The third trimester is from week 28 through week 40 (months 7 through 9). The third trimester is a time when the unborn baby (fetus) is growing rapidly. At the end of the ninth month, the fetus is about 20 inches in length and weighs 6-10 pounds. Body changes during your third trimester Your body will continue to go through many changes during pregnancy. The changes vary from woman to woman. During the third trimester:  Your weight will continue to increase. You can expect to gain 25-35 pounds (11-16 kg) by the end of the pregnancy.  You may begin to get stretch marks on your hips, abdomen, and breasts.  You may urinate more often because the fetus is moving lower into your pelvis and pressing on your bladder.  You may develop or continue to have heartburn. This is caused by increased hormones that slow down muscles in the digestive tract.  You may develop or continue to have constipation because increased hormones slow digestion and cause the muscles that push waste through your intestines to relax.  You may develop hemorrhoids. These are swollen veins (varicose veins) in the rectum that can itch or be painful.  You may develop swollen, bulging veins (varicose veins) in your legs.  You may have increased body aches in the pelvis, back, or thighs. This is due to weight gain and increased hormones that are relaxing your joints.  You may have changes in your hair. These can include thickening of your hair, rapid growth, and changes in texture. Some women also have hair loss during or after pregnancy, or hair that feels dry or thin. Your hair will most likely return to normal after your baby is born.  Your breasts will continue to grow and they will continue to become tender. A yellow fluid (colostrum) may leak from your breasts. This is the first milk you are producing for your baby.  Your belly button may stick out.  You may notice more swelling in your  hands, face, or ankles.  You may have increased tingling or numbness in your hands, arms, and legs. The skin on your belly may also feel numb.  You may feel short of breath because of your expanding uterus.  You may have more problems sleeping. This can be caused by the size of your belly, increased need to urinate, and an increase in your body's metabolism.  You may notice the fetus "dropping," or moving lower in your abdomen (lightening).  You may have increased vaginal discharge.  You may notice your joints feel loose and you may have pain around your pelvic bone.  What to expect at prenatal visits You will have prenatal exams every 2 weeks until week 36. Then you will have weekly prenatal exams. During a routine prenatal visit:  You will be weighed to make sure you and the baby are growing normally.  Your blood pressure will be taken.  Your abdomen will be measured to track your baby's growth.  The fetal heartbeat will be listened to.  Any test results from the previous visit will be discussed.  You may have a cervical check near your due date to see if your cervix has softened or thinned (effaced).  You will be tested for Group B streptococcus. This happens between 35 and 37 weeks.  Your health care provider may ask you:  What your birth plan is.  How you are feeling.  If you are feeling the baby move.  If you have   had any abnormal symptoms, such as leaking fluid, bleeding, severe headaches, or abdominal cramping.  If you are using any tobacco products, including cigarettes, chewing tobacco, and electronic cigarettes.  If you have any questions.  Other tests or screenings that may be performed during your third trimester include:  Blood tests that check for low iron levels (anemia).  Fetal testing to check the health, activity level, and growth of the fetus. Testing is done if you have certain medical conditions or if there are problems during the  pregnancy.  Nonstress test (NST). This test checks the health of your baby to make sure there are no signs of problems, such as the baby not getting enough oxygen. During this test, a belt is placed around your belly. The baby is made to move, and its heart rate is monitored during movement.  What is false labor? False labor is a condition in which you feel small, irregular tightenings of the muscles in the womb (contractions) that usually go away with rest, changing position, or drinking water. These are called Braxton Hicks contractions. Contractions may last for hours, days, or even weeks before true labor sets in. If contractions come at regular intervals, become more frequent, increase in intensity, or become painful, you should see your health care provider. What are the signs of labor?  Abdominal cramps.  Regular contractions that start at 10 minutes apart and become stronger and more frequent with time.  Contractions that start on the top of the uterus and spread down to the lower abdomen and back.  Increased pelvic pressure and dull back pain.  A watery or bloody mucus discharge that comes from the vagina.  Leaking of amniotic fluid. This is also known as your "water breaking." It could be a slow trickle or a gush. Let your health care provider know if it has a color or strange odor. If you have any of these signs, call your health care provider right away, even if it is before your due date. Follow these instructions at home: Medicines  Follow your health care provider's instructions regarding medicine use. Specific medicines may be either safe or unsafe to take during pregnancy.  Take a prenatal vitamin that contains at least 600 micrograms (mcg) of folic acid.  If you develop constipation, try taking a stool softener if your health care provider approves. Eating and drinking  Eat a balanced diet that includes fresh fruits and vegetables, whole grains, good sources of protein  such as meat, eggs, or tofu, and low-fat dairy. Your health care provider will help you determine the amount of weight gain that is right for you.  Avoid raw meat and uncooked cheese. These carry germs that can cause birth defects in the baby.  If you have low calcium intake from food, talk to your health care provider about whether you should take a daily calcium supplement.  Eat four or five small meals rather than three large meals a day.  Limit foods that are high in fat and processed sugars, such as fried and sweet foods.  To prevent constipation: ? Drink enough fluid to keep your urine clear or pale yellow. ? Eat foods that are high in fiber, such as fresh fruits and vegetables, whole grains, and beans. Activity  Exercise only as directed by your health care provider. Most women can continue their usual exercise routine during pregnancy. Try to exercise for 30 minutes at least 5 days a week. Stop exercising if you experience uterine contractions.  Avoid   heavy lifting.  Do not exercise in extreme heat or humidity, or at high altitudes.  Wear low-heel, comfortable shoes.  Practice good posture.  You may continue to have sex unless your health care provider tells you otherwise. Relieving pain and discomfort  Take frequent breaks and rest with your legs elevated if you have leg cramps or low back pain.  Take warm sitz baths to soothe any pain or discomfort caused by hemorrhoids. Use hemorrhoid cream if your health care provider approves.  Wear a good support bra to prevent discomfort from breast tenderness.  If you develop varicose veins: ? Wear support pantyhose or compression stockings as told by your healthcare provider. ? Elevate your feet for 15 minutes, 3-4 times a day. Prenatal care  Write down your questions. Take them to your prenatal visits.  Keep all your prenatal visits as told by your health care provider. This is important. Safety  Wear your seat belt at  all times when driving.  Make a list of emergency phone numbers, including numbers for family, friends, the hospital, and police and fire departments. General instructions  Avoid cat litter boxes and soil used by cats. These carry germs that can cause birth defects in the baby. If you have a cat, ask someone to clean the litter box for you.  Do not travel far distances unless it is absolutely necessary and only with the approval of your health care provider.  Do not use hot tubs, steam rooms, or saunas.  Do not drink alcohol.  Do not use any products that contain nicotine or tobacco, such as cigarettes and e-cigarettes. If you need help quitting, ask your health care provider.  Do not use any medicinal herbs or unprescribed drugs. These chemicals affect the formation and growth of the baby.  Do not douche or use tampons or scented sanitary pads.  Do not cross your legs for long periods of time.  To prepare for the arrival of your baby: ? Take prenatal classes to understand, practice, and ask questions about labor and delivery. ? Make a trial run to the hospital. ? Visit the hospital and tour the maternity area. ? Arrange for maternity or paternity leave through employers. ? Arrange for family and friends to take care of pets while you are in the hospital. ? Purchase a rear-facing car seat and make sure you know how to install it in your car. ? Pack your hospital bag. ? Prepare the baby's nursery. Make sure to remove all pillows and stuffed animals from the baby's crib to prevent suffocation.  Visit your dentist if you have not gone during your pregnancy. Use a soft toothbrush to brush your teeth and be gentle when you floss. Contact a health care provider if:  You are unsure if you are in labor or if your water has broken.  You become dizzy.  You have mild pelvic cramps, pelvic pressure, or nagging pain in your abdominal area.  You have lower back pain.  You have persistent  nausea, vomiting, or diarrhea.  You have an unusual or bad smelling vaginal discharge.  You have pain when you urinate. Get help right away if:  Your water breaks before 37 weeks.  You have regular contractions less than 5 minutes apart before 37 weeks.  You have a fever.  You are leaking fluid from your vagina.  You have spotting or bleeding from your vagina.  You have severe abdominal pain or cramping.  You have rapid weight loss or weight   gain.  You have shortness of breath with chest pain.  You notice sudden or extreme swelling of your face, hands, ankles, feet, or legs.  Your baby makes fewer than 10 movements in 2 hours.  You have severe headaches that do not go away when you take medicine.  You have vision changes. Summary  The third trimester is from week 28 through week 40, months 7 through 9. The third trimester is a time when the unborn baby (fetus) is growing rapidly.  During the third trimester, your discomfort may increase as you and your baby continue to gain weight. You may have abdominal, leg, and back pain, sleeping problems, and an increased need to urinate.  During the third trimester your breasts will keep growing and they will continue to become tender. A yellow fluid (colostrum) may leak from your breasts. This is the first milk you are producing for your baby.  False labor is a condition in which you feel small, irregular tightenings of the muscles in the womb (contractions) that eventually go away. These are called Braxton Hicks contractions. Contractions may last for hours, days, or even weeks before true labor sets in.  Signs of labor can include: abdominal cramps; regular contractions that start at 10 minutes apart and become stronger and more frequent with time; watery or bloody mucus discharge that comes from the vagina; increased pelvic pressure and dull back pain; and leaking of amniotic fluid. This information is not intended to replace advice  given to you by your health care provider. Make sure you discuss any questions you have with your health care provider. Document Released: 12/24/2000 Document Revised: 06/07/2015 Document Reviewed: 03/02/2012 [employer] Interactive Patient Education  2017 [employer]   Breastfeeding Deciding to breastfeed is one of the best choices you can make for you and your baby. A change in hormones during pregnancy causes your breast tissue to grow and increases the number and size of your milk ducts. These hormones also allow proteins, sugars, and fats from your blood supply to make breast milk in your milk-producing glands. Hormones prevent breast milk from being released before your baby is born as well as prompt milk flow after birth. Once breastfeeding has begun, thoughts of your baby, as well as his or her sucking or crying, can stimulate the release of milk from your milk-producing glands. Benefits of breastfeeding For Your Baby  Your first milk (colostrum) helps your baby's digestive system function better.  There are antibodies in your milk that help your baby fight off infections.  Your baby has a lower incidence of asthma, allergies, and sudden infant death syndrome.  The nutrients in breast milk are better for your baby than infant formulas and are designed uniquely for your baby's needs.  Breast milk improves your baby's brain development.  Your baby is less likely to develop other conditions, such as childhood obesity, asthma, or type 2 diabetes mellitus.  For You  Breastfeeding helps to create a very special bond between you and your baby.  Breastfeeding is convenient. Breast milk is always available at the correct temperature and costs nothing.  Breastfeeding helps to burn calories and helps you lose the weight gained during pregnancy.  Breastfeeding makes your uterus contract to its prepregnancy size faster and slows bleeding (lochia) after you give birth.  Breastfeeding helps  to lower your risk of developing type 2 diabetes mellitus, osteoporosis, and breast or ovarian cancer later in life.  Signs that your baby is hungry Early Signs of Hunger    Increased alertness or activity.  Stretching.  Movement of the head from side to side.  Movement of the head and opening of the mouth when the corner of the mouth or cheek is stroked (rooting).  Increased sucking sounds, smacking lips, cooing, sighing, or squeaking.  Hand-to-mouth movements.  Increased sucking of fingers or hands.  Late Signs of Hunger  Fussing.  Intermittent crying.  Extreme Signs of Hunger Signs of extreme hunger will require calming and consoling before your baby will be able to breastfeed successfully. Do not wait for the following signs of extreme hunger to occur before you initiate breastfeeding:  Restlessness.  A loud, strong cry.  Screaming.  Breastfeeding basics Breastfeeding Initiation  Find a comfortable place to sit or lie down, with your neck and back well supported.  Place a pillow or rolled up blanket under your baby to bring him or her to the level of your breast (if you are seated). Nursing pillows are specially designed to help support your arms and your baby while you breastfeed.  Make sure that your baby's abdomen is facing your abdomen.  Gently massage your breast. With your fingertips, massage from your chest wall toward your nipple in a circular motion. This encourages milk flow. You may need to continue this action during the feeding if your milk flows slowly.  Support your breast with 4 fingers underneath and your thumb above your nipple. Make sure your fingers are well away from your nipple and your baby's mouth.  Stroke your baby's lips gently with your finger or nipple.  When your baby's mouth is open wide enough, quickly bring your baby to your breast, placing your entire nipple and as much of the colored area around your nipple (areola) as possible into  your baby's mouth. ? More areola should be visible above your baby's upper lip than below the lower lip. ? Your baby's tongue should be between his or her lower gum and your breast.  Ensure that your baby's mouth is correctly positioned around your nipple (latched). Your baby's lips should create a seal on your breast and be turned out (everted).  It is common for your baby to suck about 2-3 minutes in order to start the flow of breast milk.  Latching Teaching your baby how to latch on to your breast properly is very important. An improper latch can cause nipple pain and decreased milk supply for you and poor weight gain in your baby. Also, if your baby is not latched onto your nipple properly, he or she may swallow some air during feeding. This can make your baby fussy. Burping your baby when you switch breasts during the feeding can help to get rid of the air. However, teaching your baby to latch on properly is still the best way to prevent fussiness from swallowing air while breastfeeding. Signs that your baby has successfully latched on to your nipple:  Silent tugging or silent sucking, without causing you pain.  Swallowing heard between every 3-4 sucks.  Muscle movement above and in front of his or her ears while sucking.  Signs that your baby has not successfully latched on to nipple:  Sucking sounds or smacking sounds from your baby while breastfeeding.  Nipple pain.  If you think your baby has not latched on correctly, slip your finger into the corner of your baby's mouth to break the suction and place it between your baby's gums. Attempt breastfeeding initiation again. Signs of Successful Breastfeeding Signs from your baby:  A   gradual decrease in the number of sucks or complete cessation of sucking.  Falling asleep.  Relaxation of his or her body.  Retention of a small amount of milk in his or her mouth.  Letting go of your breast by himself or herself.  Signs from  you:  Breasts that have increased in firmness, weight, and size 1-3 hours after feeding.  Breasts that are softer immediately after breastfeeding.  Increased milk volume, as well as a change in milk consistency and color by the fifth day of breastfeeding.  Nipples that are not sore, cracked, or bleeding.  Signs That Your Baby is Getting Enough Milk  Wetting at least 1-2 diapers during the first 24 hours after birth.  Wetting at least 5-6 diapers every 24 hours for the first week after birth. The urine should be clear or pale yellow by 5 days after birth.  Wetting 6-8 diapers every 24 hours as your baby continues to grow and develop.  At least 3 stools in a 24-hour period by age 5 days. The stool should be soft and yellow.  At least 3 stools in a 24-hour period by age 7 days. The stool should be seedy and yellow.  No loss of weight greater than 10% of birth weight during the first 3 days of age.  Average weight gain of 4-7 ounces (113-198 g) per week after age 4 days.  Consistent daily weight gain by age 5 days, without weight loss after the age of 2 weeks.  After a feeding, your baby may spit up a small amount. This is common. Breastfeeding frequency and duration Frequent feeding will help you make more milk and can prevent sore nipples and breast engorgement. Breastfeed when you feel the need to reduce the fullness of your breasts or when your baby shows signs of hunger. This is called "breastfeeding on demand." Avoid introducing a pacifier to your baby while you are working to establish breastfeeding (the first 4-6 weeks after your baby is born). After this time you may choose to use a pacifier. Research has shown that pacifier use during the first year of a baby's life decreases the risk of sudden infant death syndrome (SIDS). Allow your baby to feed on each breast as long as he or she wants. Breastfeed until your baby is finished feeding. When your baby unlatches or falls asleep  while feeding from the first breast, offer the second breast. Because newborns are often sleepy in the first few weeks of life, you may need to awaken your baby to get him or her to feed. Breastfeeding times will vary from baby to baby. However, the following rules can serve as a guide to help you ensure that your baby is properly fed:  Newborns (babies 4 weeks of age or younger) may breastfeed every 1-3 hours.  Newborns should not go longer than 3 hours during the day or 5 hours during the night without breastfeeding.  You should breastfeed your baby a minimum of 8 times in a 24-hour period until you begin to introduce solid foods to your baby at around 6 months of age.  Breast milk pumping Pumping and storing breast milk allows you to ensure that your baby is exclusively fed your breast milk, even at times when you are unable to breastfeed. This is especially important if you are going back to work while you are still breastfeeding or when you are not able to be present during feedings. Your lactation consultant can give you guidelines on how   long it is safe to store breast milk. A breast pump is a machine that allows you to pump milk from your breast into a sterile bottle. The pumped breast milk can then be stored in a refrigerator or freezer. Some breast pumps are operated by hand, while others use electricity. Ask your lactation consultant which type will work best for you. Breast pumps can be purchased, but some hospitals and breastfeeding support groups lease breast pumps on a monthly basis. A lactation consultant can teach you how to hand express breast milk, if you prefer not to use a pump. Caring for your breasts while you breastfeed Nipples can become dry, cracked, and sore while breastfeeding. The following recommendations can help keep your breasts moisturized and healthy:  Avoid using soap on your nipples.  Wear a supportive bra. Although not required, special nursing bras and tank  tops are designed to allow access to your breasts for breastfeeding without taking off your entire bra or top. Avoid wearing underwire-style bras or extremely tight bras.  Air dry your nipples for 3-4minutes after each feeding.  Use only cotton bra pads to absorb leaked breast milk. Leaking of breast milk between feedings is normal.  Use lanolin on your nipples after breastfeeding. Lanolin helps to maintain your skin's normal moisture barrier. If you use pure lanolin, you do not need to wash it off before feeding your baby again. Pure lanolin is not toxic to your baby. You may also hand express a few drops of breast milk and gently massage that milk into your nipples and allow the milk to air dry.  In the first few weeks after giving birth, some women experience extremely full breasts (engorgement). Engorgement can make your breasts feel heavy, warm, and tender to the touch. Engorgement peaks within 3-5 days after you give birth. The following recommendations can help ease engorgement:  Completely empty your breasts while breastfeeding or pumping. You may want to start by applying warm, moist heat (in the shower or with warm water-soaked hand towels) just before feeding or pumping. This increases circulation and helps the milk flow. If your baby does not completely empty your breasts while breastfeeding, pump any extra milk after he or she is finished.  Wear a snug bra (nursing or regular) or tank top for 1-2 days to signal your body to slightly decrease milk production.  Apply ice packs to your breasts, unless this is too uncomfortable for you.  Make sure that your baby is latched on and positioned properly while breastfeeding.  If engorgement persists after 48 hours of following these recommendations, contact your health care provider or a lactation consultant. Overall health care recommendations while breastfeeding  Eat healthy foods. Alternate between meals and snacks, eating 3 of each per  day. Because what you eat affects your breast milk, some of the foods may make your baby more irritable than usual. Avoid eating these foods if you are sure that they are negatively affecting your baby.  Drink milk, fruit juice, and water to satisfy your thirst (about 10 glasses a day).  Rest often, relax, and continue to take your prenatal vitamins to prevent fatigue, stress, and anemia.  Continue breast self-awareness checks.  Avoid chewing and smoking tobacco. Chemicals from cigarettes that pass into breast milk and exposure to secondhand smoke may harm your baby.  Avoid alcohol and drug use, including marijuana. Some medicines that may be harmful to your baby can pass through breast milk. It is important to ask your health care   provider before taking any medicine, including all over-the-counter and prescription medicine as well as vitamin and herbal supplements. It is possible to become pregnant while breastfeeding. If birth control is desired, ask your health care provider about options that will be safe for your baby. Contact a health care provider if:  You feel like you want to stop breastfeeding or have become frustrated with breastfeeding.  You have painful breasts or nipples.  Your nipples are cracked or bleeding.  Your breasts are red, tender, or warm.  You have a swollen area on either breast.  You have a fever or chills.  You have nausea or vomiting.  You have drainage other than breast milk from your nipples.  Your breasts do not become full before feedings by the fifth day after you give birth.  You feel sad and depressed.  Your baby is too sleepy to eat well.  Your baby is having trouble sleeping.  Your baby is wetting less than 3 diapers in a 24-hour period.  Your baby has less than 3 stools in a 24-hour period.  Your baby's skin or the white part of his or her eyes becomes yellow.  Your baby is not gaining weight by 5 days of age. Get help right away  if:  Your baby is overly tired (lethargic) and does not want to wake up and feed.  Your baby develops an unexplained fever. This information is not intended to replace advice given to you by your health care provider. Make sure you discuss any questions you have with your health care provider. Document Released: 12/30/2004 Document Revised: 06/13/2015 Document Reviewed: 06/23/2012 Elsevier Interactive Patient Education  2017 Elsevier Inc.  

## 2016-09-09 NOTE — Progress Notes (Signed)
   PRENATAL VISIT NOTE  Subjective:  Janet [name] is a 31 y.o. G3P1012 at [redacted]w[redacted]d being seen today for ongoing prenatal care.  She is currently monitored for the following issues for this high-risk pregnancy and has Supervision of high-risk pregnancy; History of gestational hypertension; History of cesarean delivery; Obesity in pregnancy; Sickle cell trait (HCC); Migraine; Obesity, Class II, BMI 35-39.9; Asthma affecting pregnancy, antepartum; Abdominal pain in pregnancy; and Gestational diabetes mellitus, antepartum on her problem list.  Patient reports vaginal irritation. Tried Vagisil without improvement.  Contractions: Irregular. Vag. Bleeding: None.  Movement: Present. Denies leaking of fluid.   The following portions of the patient's history were reviewed and updated as appropriate: allergies, current medications, past family history, past medical history, past social history, past surgical history and problem list. Problem list updated.  Objective:   Vitals:   09/09/16 1326  BP: 113/74  Pulse: (!) 101  Weight: 244 lb (110.7 kg)    Fetal Status: Fetal Heart Rate (bpm): 142 Fundal Height: 34 cm Movement: Present     General:  Alert, oriented and cooperative. Patient is in no acute distress.  Skin: Skin is warm and dry. No rash noted.   Cardiovascular: Normal heart rate noted  Respiratory: Normal respiratory effort, no problems with respiration noted  Abdomen: Soft, gravid, appropriate for gestational age.  Pain/Pressure: Present     Pelvic: Cervical exam deferred      pink erythema c/w yeast  Extremities: Normal range of motion.  Edema: None  Mental Status:  Normal mood and affect. Normal behavior. Normal judgment and thought content.   Assessment and Plan:  Pregnancy: G3P1012 at [redacted]w[redacted]d  1. Diet controlled gestational diabetes mellitus (GDM), antepartum CBGs are good Continue diet See Mandy's note from today  2. Supervision of high risk pregnancy in third trimester   3.  History of cesarean delivery For RCS at 39 wks  4. Vaginal discharge Treat with topical antifungal - Cervicovaginal ancillary only - terconazole (TERAZOL 3) 0.8 % vaginal cream; Place 1 applicator vaginally at bedtime.  Dispense: 20 g; Refill: 0  Preterm labor symptoms and general obstetric precautions including but not limited to vaginal bleeding, contractions, leaking of fluid and fetal movement were reviewed in detail with the patient. Please refer to After Visit Summary for other counseling recommendations.  Return in 2 weeks (on 09/23/2016).   Reva Bores, MD

## 2016-09-11 NOTE — Addendum Note (Signed)
Addended by: Arne ClevelandHUTCHINSON, MANDY J on: 09/11/2016 09:09 AM   Modules accepted: Orders

## 2016-09-16 LAB — SPECIMEN STATUS REPORT

## 2016-09-16 LAB — NUSWAB VAGINITIS PLUS (VG+)
CANDIDA ALBICANS, NAA: POSITIVE — AB
CANDIDA GLABRATA, NAA: NEGATIVE
Chlamydia trachomatis, NAA: NEGATIVE
NEISSERIA GONORRHOEAE, NAA: NEGATIVE
TRICH VAG BY NAA: NEGATIVE

## 2016-09-23 ENCOUNTER — Other Ambulatory Visit (HOSPITAL_COMMUNITY)
Admission: RE | Admit: 2016-09-23 | Discharge: 2016-09-23 | Disposition: A | Discharge status: Home / Self Care | Payer: Medicaid Other | Source: Ambulatory Visit | Attending: Obstetrics and Gynecology | Admitting: Obstetrics and Gynecology

## 2016-09-23 ENCOUNTER — Encounter: Payer: Medicaid Other | Admitting: Obstetrics and Gynecology

## 2016-09-23 ENCOUNTER — Ambulatory Visit (INDEPENDENT_AMBULATORY_CARE_PROVIDER_SITE_OTHER): Payer: Medicaid Other | Admitting: Obstetrics and Gynecology

## 2016-09-23 VITALS — BP 112/72 | HR 97 | Wt 245.0 lb

## 2016-09-23 DIAGNOSIS — Z98891 History of uterine scar from previous surgery: Secondary | ICD-10-CM

## 2016-09-23 DIAGNOSIS — O99513 Diseases of the respiratory system complicating pregnancy, third trimester: Secondary | ICD-10-CM | POA: Insufficient documentation

## 2016-09-23 DIAGNOSIS — O0993 Supervision of high risk pregnancy, unspecified, third trimester: Secondary | ICD-10-CM | POA: Diagnosis present

## 2016-09-23 DIAGNOSIS — J45909 Unspecified asthma, uncomplicated: Secondary | ICD-10-CM

## 2016-09-23 DIAGNOSIS — Z331 Pregnant state, incidental: Secondary | ICD-10-CM

## 2016-09-23 DIAGNOSIS — Z113 Encounter for screening for infections with a predominantly sexual mode of transmission: Secondary | ICD-10-CM | POA: Diagnosis not present

## 2016-09-23 DIAGNOSIS — O99519 Diseases of the respiratory system complicating pregnancy, unspecified trimester: Secondary | ICD-10-CM

## 2016-09-23 DIAGNOSIS — O24410 Gestational diabetes mellitus in pregnancy, diet controlled: Secondary | ICD-10-CM

## 2016-09-23 DIAGNOSIS — Z8759 Personal history of other complications of pregnancy, childbirth and the puerperium: Secondary | ICD-10-CM

## 2016-09-23 NOTE — Progress Notes (Signed)
Prenatal Visit Note Date: 09/23/2016 Clinic: [facility]  Subjective:  Janet [name] is a 31 y.o. [id number] at 2010w4d being seen today for ongoing prenatal care.  She is currently monitored for the following issues for this high-risk pregnancy and has Supervision of high-risk pregnancy; History of gestational hypertension; History of cesarean delivery; Obesity in pregnancy; Sickle cell trait (HCC); Migraine; Obesity, Class II, BMI 35-39.9; Asthma affecting pregnancy, antepartum; and Gestational diabetes mellitus, antepartum on her problem list.  Patient reports no complaints.   Contractions: Irregular. Vag. Bleeding: Scant.  Movement: Present. Denies leaking of fluid.   The following portions of the patient's history were reviewed and updated as appropriate: allergies, current medications, past family history, past medical history, past social history, past surgical history and problem list. Problem list updated.  Objective:   Vitals:   09/23/16 1348  BP: 112/72  Pulse: 97  Weight: 245 lb (111.1 kg)    Fetal Status: Fetal Heart Rate (bpm): 153 Fundal Height: 35 cm Movement: Present  Presentation: Vertex  General:  Alert, oriented and cooperative. Patient is in no acute distress.  Skin: Skin is warm and dry. No rash noted.   Cardiovascular: Normal heart rate noted  Respiratory: Normal respiratory effort, no problems with respiration noted  Abdomen: Soft, gravid, appropriate for gestational age. Pain/Pressure: Present     Pelvic:  Cervical exam deferred        Extremities: Normal range of motion.  Edema: None  Mental Status: Normal mood and affect. Normal behavior. Normal judgment and thought content.   Urinalysis:      Assessment and Plan:  Pregnancy: G3P1012 at 3010w4d  1. Asthma affecting pregnancy, antepartum Doing well on no meds - Strep Gp B NAA - Cervicovaginal ancillary only  2. Diet controlled gestational diabetes mellitus (GDM), antepartum BS values  are normal but patient encouraged to check more often.  - Cervicovaginal ancillary only  3. Supervision of high risk pregnancy in third trimester IUD - Cervicovaginal ancillary only  4. History of gestational hypertension - Cervicovaginal ancillary only  5. History of cesarean delivery Already scheduled - Cervicovaginal ancillary only   Preterm labor symptoms and general obstetric precautions including but not limited to vaginal bleeding, contractions, leaking of fluid and fetal movement were reviewed in detail with the patient. Please refer to After Visit Summary for other counseling recommendations.  Return in about 1 week (around 09/30/2016) for 7-10d rob.   Thedford BingPickens, Jeremy Mclamb, MD

## 2016-09-24 LAB — CERVICOVAGINAL ANCILLARY ONLY
CHLAMYDIA, DNA PROBE: NEGATIVE
Neisseria Gonorrhea: NEGATIVE

## 2016-09-25 LAB — STREP GP B NAA: STREP GROUP B AG: NEGATIVE

## 2016-09-30 ENCOUNTER — Ambulatory Visit (INDEPENDENT_AMBULATORY_CARE_PROVIDER_SITE_OTHER): Payer: Medicaid Other | Admitting: Family Medicine

## 2016-09-30 VITALS — BP 105/41 | HR 87 | Wt 244.2 lb

## 2016-09-30 DIAGNOSIS — O99210 Obesity complicating pregnancy, unspecified trimester: Secondary | ICD-10-CM

## 2016-09-30 DIAGNOSIS — Z98891 History of uterine scar from previous surgery: Secondary | ICD-10-CM

## 2016-09-30 DIAGNOSIS — O24410 Gestational diabetes mellitus in pregnancy, diet controlled: Secondary | ICD-10-CM

## 2016-09-30 DIAGNOSIS — O0993 Supervision of high risk pregnancy, unspecified, third trimester: Secondary | ICD-10-CM

## 2016-09-30 DIAGNOSIS — Z8759 Personal history of other complications of pregnancy, childbirth and the puerperium: Secondary | ICD-10-CM

## 2016-09-30 NOTE — Progress Notes (Signed)
   PRENATAL VISIT NOTE  Subjective:  Janet [name] is a 31 y.o. G3P1012 at [redacted]w[redacted]d being seen today for ongoing prenatal care.  She is currently monitored for the following issues for this high-risk pregnancy and has Supervision of high-risk pregnancy; History of gestational hypertension; History of cesarean delivery; Obesity in pregnancy; Sickle cell trait (HCC); Migraine; Obesity, Class II, BMI 35-39.9; Asthma affecting pregnancy, antepartum; and Gestational diabetes mellitus, antepartum on her problem list.  Patient reports no complaints.  Contractions: Irregular.  .  Movement: Present. Denies leaking of fluid.   The following portions of the patient's history were reviewed and updated as appropriate: allergies, current medications, past family history, past medical history, past social history, past surgical history and problem list. Problem list updated.  Objective:   Vitals:   09/30/16 1334  BP: (!) 105/41  Pulse: 87  Weight: 244 lb 3.2 oz (110.8 kg)    Fetal Status: Fetal Heart Rate (bpm): 135   Movement: Present     General:  Alert, oriented and cooperative. Patient is in no acute distress.  Skin: Skin is warm and dry. No rash noted.   Cardiovascular: Normal heart rate noted  Respiratory: Normal respiratory effort, no problems with respiration noted  Abdomen: Soft, gravid, appropriate for gestational age.  Pain/Pressure: Present     Pelvic: Cervical exam deferred        Extremities: Normal range of motion.  Edema: None  Mental Status:  Normal mood and affect. Normal behavior. Normal judgment and thought content.   Assessment and Plan:  Pregnancy: G3P1012 at [redacted]w[redacted]d  1. Diet controlled gestational diabetes mellitus (GDM), antepartum BS values at goal today 49th% at 30wks Ordered MFM [place] growth [place]  2. Supervision of high risk pregnancy in third trimester Up to date  3. History of cesarean delivery Has RCS scheduled on 10/5. Patient would like a clear drape for  Discussed  if patient goes into labor prior CS if she might want TOLAC. She will think about this  4. History of gestational hypertension BP wnl today  5. Obesity in pregnancy Total weight gain  19 lb 3.2 oz (8.709 kg) - above goal    Preterm labor symptoms and general obstetric precautions including but not limited to vaginal bleeding, contractions, leaking of fluid and fetal movement were reviewed in detail with the patient. Please refer to After Visit Summary for other counseling recommendations.   Return in about 1 week (around 10/07/2016) for Routine prenatal care.   Federico Flake, MD

## 2016-10-08 ENCOUNTER — Encounter: Payer: Medicaid Other | Admitting: Obstetrics and Gynecology

## 2016-10-08 ENCOUNTER — Ambulatory Visit (INDEPENDENT_AMBULATORY_CARE_PROVIDER_SITE_OTHER): Payer: Medicaid Other | Admitting: Obstetrics & Gynecology

## 2016-10-08 VITALS — BP 118/76 | HR 93 | Wt 245.0 lb

## 2016-10-08 DIAGNOSIS — Z98891 History of uterine scar from previous surgery: Secondary | ICD-10-CM

## 2016-10-08 DIAGNOSIS — Z23 Encounter for immunization: Secondary | ICD-10-CM | POA: Diagnosis not present

## 2016-10-08 DIAGNOSIS — O0993 Supervision of high risk pregnancy, unspecified, third trimester: Secondary | ICD-10-CM

## 2016-10-08 DIAGNOSIS — O34219 Maternal care for unspecified type scar from previous cesarean delivery: Secondary | ICD-10-CM

## 2016-10-08 DIAGNOSIS — Z8759 Personal history of other complications of pregnancy, childbirth and the puerperium: Secondary | ICD-10-CM

## 2016-10-08 DIAGNOSIS — O24410 Gestational diabetes mellitus in pregnancy, diet controlled: Secondary | ICD-10-CM

## 2016-10-08 NOTE — Progress Notes (Signed)
   PRENATAL VISIT NOTE  Subjective:  Janet [name] is a 31 y.o. G3P1012 at [redacted]w[redacted]d being seen today for ongoing prenatal care.  She is currently monitored for the following issues for this high-risk pregnancy and has Supervision of high-risk pregnancy; History of gestational hypertension; History of cesarean delivery; Obesity in pregnancy; Sickle cell trait (HCC); Migraine; Obesity, Class II, BMI 35-39.9; Asthma affecting pregnancy, antepartum; and Gestational diabetes mellitus, antepartum on her problem list.  Patient reports no complaints.  Contractions: Irregular. Vag. Bleeding: None.  Movement: Present. Denies leaking of fluid.   The following portions of the patient's history were reviewed and updated as appropriate: allergies, current medications, past family history, past medical history, past social history, past surgical history and problem list. Problem list updated.  Objective:   Vitals:   10/08/16 1053  BP: 118/76  Pulse: 93  Weight: 245 lb (111.1 kg)    Fetal Status:     Movement: Present     General:  Alert, oriented and cooperative. Patient is in no acute distress.  Skin: Skin is warm and dry. No rash noted.   Cardiovascular: Normal heart rate noted  Respiratory: Normal respiratory effort, no problems with respiration noted  Abdomen: Soft, gravid, appropriate for gestational age.  Pain/Pressure: Present     Pelvic: Cervical exam performed        Extremities: Normal range of motion.  Edema: None  Mental Status:  Normal mood and affect. Normal behavior. Normal judgment and thought content.   Assessment and Plan:  Pregnancy: G3P1012 at [redacted]w[redacted]d  1. Needs flu shot  - Flu Vaccine QUAD 36+ mos IM  2. Supervision of high risk pregnancy in third trimester   3. History of gestational hypertension - sugars ok  4. Diet controlled gestational diabetes mellitus (GDM), antepartum   5. History of cesarean delivery - scheduled for repeat 10/17/16  Term labor symptoms and  general obstetric precautions including but not limited to vaginal bleeding, contractions, leaking of fluid and fetal movement were reviewed in detail with the patient. Please refer to After Visit Summary for other counseling recommendations.  Return in about 1 week (around 10/15/2016).   Allie Bossier, MD

## 2016-10-09 ENCOUNTER — Encounter: Payer: Self-pay | Admitting: *Deleted

## 2016-10-09 ENCOUNTER — Encounter (HOSPITAL_COMMUNITY): Payer: Self-pay

## 2016-10-09 ENCOUNTER — Telehealth (HOSPITAL_COMMUNITY): Payer: Self-pay | Admitting: *Deleted

## 2016-10-09 ENCOUNTER — Observation Stay
Admission: EM | Admit: 2016-10-09 | Discharge: 2016-10-09 | Disposition: A | Discharge status: Home / Self Care | Payer: Medicaid Other | Attending: Obstetrics and Gynecology | Admitting: Obstetrics and Gynecology

## 2016-10-09 DIAGNOSIS — O9989 Other specified diseases and conditions complicating pregnancy, childbirth and the puerperium: Secondary | ICD-10-CM | POA: Diagnosis present

## 2016-10-09 DIAGNOSIS — Z3A38 38 weeks gestation of pregnancy: Secondary | ICD-10-CM | POA: Insufficient documentation

## 2016-10-09 DIAGNOSIS — Z3490 Encounter for supervision of normal pregnancy, unspecified, unspecified trimester: Secondary | ICD-10-CM

## 2016-10-09 DIAGNOSIS — R05 Cough: Secondary | ICD-10-CM | POA: Insufficient documentation

## 2016-10-09 DIAGNOSIS — R112 Nausea with vomiting, unspecified: Secondary | ICD-10-CM | POA: Diagnosis not present

## 2016-10-09 DIAGNOSIS — R509 Fever, unspecified: Secondary | ICD-10-CM | POA: Insufficient documentation

## 2016-10-09 HISTORY — DX: Anemia, unspecified: D64.9

## 2016-10-09 LAB — COMPREHENSIVE METABOLIC PANEL
ALBUMIN: 2.7 g/dL — AB (ref 3.5–5.0)
ALT: 16 U/L (ref 14–54)
ANION GAP: 7 (ref 5–15)
AST: 27 U/L (ref 15–41)
Alkaline Phosphatase: 106 U/L (ref 38–126)
BUN: 9 mg/dL (ref 6–20)
CHLORIDE: 106 mmol/L (ref 101–111)
CO2: 22 mmol/L (ref 22–32)
Calcium: 8.5 mg/dL — ABNORMAL LOW (ref 8.9–10.3)
Creatinine, Ser: 0.53 mg/dL (ref 0.44–1.00)
GFR calc Af Amer: >60 mL/min (ref >60)
GFR calc non Af Amer: >60 mL/min (ref >60)
GLUCOSE: 98 mg/dL (ref 65–99)
POTASSIUM: 3.5 mmol/L (ref 3.5–5.1)
SODIUM: 135 mmol/L (ref 135–145)
TOTAL PROTEIN: 5.9 g/dL — AB (ref 6.5–8.1)
Total Bilirubin: 0.7 mg/dL (ref 0.3–1.2)

## 2016-10-09 LAB — URINALYSIS, COMPLETE (UACMP) WITH MICROSCOPIC
BILIRUBIN URINE: NEGATIVE
Glucose, UA: NEGATIVE mg/dL
Hgb urine dipstick: NEGATIVE
Ketones, ur: NEGATIVE mg/dL
Leukocytes, UA: NEGATIVE
NITRITE: NEGATIVE
PH: 6 (ref 5.0–8.0)
Protein, ur: NEGATIVE mg/dL
SPECIFIC GRAVITY, URINE: 1.015 (ref 1.005–1.030)

## 2016-10-09 LAB — INFLUENZA PANEL BY PCR (TYPE A & B)
Influenza A By PCR: NEGATIVE
Influenza B By PCR: NEGATIVE

## 2016-10-09 LAB — CBC
HCT: 29.7 % — ABNORMAL LOW (ref 35.0–47.0)
Hemoglobin: 10 g/dL — ABNORMAL LOW (ref 12.0–16.0)
MCH: 26.1 pg (ref 26.0–34.0)
MCHC: 33.8 g/dL (ref 32.0–36.0)
MCV: 77.2 fL — ABNORMAL LOW (ref 80.0–100.0)
Platelets: 227 10*3/uL (ref 150–440)
RBC: 3.85 MIL/uL (ref 3.80–5.20)
RDW: 15 % — AB (ref 11.5–14.5)
WBC: 8.2 10*3/uL (ref 3.6–11.0)

## 2016-10-09 MED ORDER — OXYCODONE HCL 5 MG PO TABS
5.0000 mg | ORAL_TABLET | Freq: Once | ORAL | Status: AC
  Administered 2016-10-09: 5 mg via ORAL
  Filled 2016-10-09: qty 1

## 2016-10-09 MED ORDER — DEXTROSE IN LACTATED RINGERS 5 % IV SOLN
INTRAVENOUS | Status: DC
  Administered 2016-10-09: 03:00:00 via INTRAVENOUS

## 2016-10-09 NOTE — Progress Notes (Signed)
Pt discharged home in stable condition via wheelchair with husband. Discharge instructions given and discussed. Pt is to follow up with next prenatal appt at [facility] on wed 10/3. Pt denies any questions or concerns at this time.

## 2016-10-09 NOTE — Progress Notes (Signed)
Dr Feliberto Gottron notified of pt arrival to unit and complaint. MD notified of FHR tracing with elevated baseline and CTX x 2 on monitor. MD aware of pt pelvic pain and history. Orders received and read back. MD to see pt in the morning.

## 2016-10-09 NOTE — OB Triage Note (Signed)
Pt arrived from home with complaints of feeling feverish and chills since 6 pm. Pt reports some pelvic discomfort but states "its not that bad." pt reports being seen at [facility] today and has a c/s scheduled on 10/5. Pt denies any CTX, vaginal bleeding or discharge. Pt reports positive fetal movement.

## 2016-10-09 NOTE — Progress Notes (Signed)
While talking with the pt during IV insertion. Pt reports receiving the flu vaccine earlier today during her appointment.

## 2016-10-09 NOTE — Telephone Encounter (Signed)
Preadmission screen  

## 2016-10-10 NOTE — Discharge Summary (Signed)
  Pt seen on L+D for N/V [redacted] weeks pregnant . Fever  And cough   VSS t 99.5 IVF given  Influenza test negative .  Reassuring fetal monitoring and pt sent home

## 2016-10-12 ENCOUNTER — Other Ambulatory Visit: Payer: Self-pay | Admitting: Family Medicine

## 2016-10-12 NOTE — H&P (Deleted)
  The note originally documented on this encounter has been moved the the encounter in which it belongs.  

## 2016-10-12 NOTE — H&P (Signed)
Obstetric Preoperative History and Physical  Janet [name] is a 31 y.o. [id number] with IUP at [redacted]w[redacted]d presenting for scheduled cesarean section.  No acute concerns. Complications of pregnancy include previous c-section, asthma, gestational diabetes.  Prenatal Course Source of Care:  [facility] with onset of care at 13 weeks  Pregnancy complications or risks: Patient Active Problem List   Diagnosis Date Noted  . Pregnancy 10/09/2016  . Gestational diabetes mellitus, antepartum 08/12/2016  . Asthma affecting pregnancy, antepartum 07/24/2016  . Obesity, Class II, BMI 35-39.9 06/27/2016  . Migraine 06/12/2016  . Sickle cell trait (HCC) 05/15/2016  . Supervision of high-risk pregnancy 04/24/2016  . History of gestational hypertension 04/24/2016  . History of cesarean delivery 04/24/2016  . Obesity in pregnancy 04/24/2016   She plans to breastfeed and bottle feed.  She desires IUD for postpartum contraception.   Prenatal labs and studies: ABO, Rh: O/Positive/-- (04/20 0000) Antibody: Negative (04/20 0000) Rubella: 17.20 (04/20 0000) RPR: Non Reactive (07/12 0845)  HBsAg: Negative (04/20 0000)  HIV: Non-reactive (07/12 0000)  AVW:UJWJXBJY (09/11 1359) 2 hr Glucola  normal Genetic screening declined Anatomy US normal  Prenatal Transfer Tool  Maternal Diabetes: Yes Genetic Screening: Declined Maternal Ultrasounds/Referrals: Normal Fetal Ultrasounds or other Referrals:  None Maternal Substance Abuse:  No Significant Maternal Medications:  None Significant Maternal Lab Results: None  Past Medical History:  Diagnosis Date  . Anemia   . Asthma   . Depression   . Gestational diabetes   . Headache    migraines    Past Surgical History:  Procedure Laterality Date  . CESAREAN SECTION    . DILATION AND CURETTAGE OF UTERUS      OB History  Gravida Para Term Preterm AB Living  SAB TAB Ectopic Multiple Live Births  # Outcome Date GA Lbr Len/2nd  Weight Sex Delivery Anes PTL Lv  3 Current           2A Term 12/22/07    M CS-LTranv   LIV     Complications: Twins     Birth Comments: gestational hypertension  2B Term 12/22/07    M CS-LTranv   LIV  1 SAB  [redacted]w[redacted]d             Social History   Social History  . Marital status: Single    Spouse name: N/A  . Number of children: N/A  . Years of education: N/A   Social History Main Topics  . Smoking status: Never Smoker  . Smokeless tobacco: Never Used  . Alcohol use No  . Drug use: No  . Sexual activity: Yes    Birth control/ protection: None   Other Topics Concern  . Not on file   Social History Narrative  . No narrative on file    Family History  Problem Relation Age of Onset  . Hypertension Mother   . Heart disease Mother   . Diabetes Father   . Cancer Maternal Aunt        Breast     (Not in a hospital admission)  Allergies  Allergen Reactions  . Flagyl [Metronidazole] Itching  . Nsaids Swelling    Facial swelling and temporary blindness   . Zofran [Ondansetron Hcl] Other (See Comments)    Pt states "it made me crazy"  . Levaquin [Levofloxacin In D5w] Rash    Review of Systems: Negative except for what  is mentioned in HPI.  Physical Exam: LMP 01/18/2016  CONSTITUTIONAL: Well-developed, well-nourished female in no acute distress.  HENT:  Normocephalic, atraumatic. Oropharynx is clear and moist EYES: Conjunctivae and EOM are normal. No scleral icterus.  NECK: Normal range of motion, supple SKIN: Skin is warm and dry. No rash noted. Not diaphoretic. No erythema. NEUROLGIC: Alert and oriented to person, place, and time. Normal reflexes, muscle tone coordination. No cranial nerve deficit noted. PSYCHIATRIC: Normal mood and affect. Normal behavior. CARDIOVASCULAR: Normal heart rate noted, regular rhythm RESPIRATORY: Effort and breath sounds normal, no problems with respiration noted ABDOMEN: Soft, nontender, nondistended, gravid. Well-healed Pfannenstiel  incision. PELVIC: Deferred MUSCULOSKELETAL: Normal range of motion. No edema and no tenderness. 2+ distal pulses.   Pertinent Labs/Studies:   No results found for this or any previous visit (from the past 72 hour(s)).  Assessment and Plan :Janet [name] is a 31 y.o. G3P1012 at [redacted]w[redacted]d being admitted for scheduled repeat cesarean section. The risks of cesarean section discussed with the patient included but were not limited to: bleeding which may require transfusion or reoperation; infection which may require antibiotics; injury to bowel, bladder, ureters or other surrounding organs; injury to the fetus; need for additional procedures including hysterectomy in the event of a life-threatening hemorrhage; placental abnormalities wth subsequent pregnancies, incisional problems, thromboembolic phenomenon and other postoperative/anesthesia complications. The patient concurred with the proposed plan, giving informed written consent for the procedure. Patient has been NPO since last night she will remain NPO for procedure. Anesthesia and OR aware. Preoperative prophylactic antibiotics and SCDs ordered on call to the OR. To OR when ready.    Rolm Bookbinder, DO OB [facility], [facility]

## 2016-10-14 ENCOUNTER — Ambulatory Visit (HOSPITAL_COMMUNITY)
Admission: RE | Admit: 2016-10-14 | Discharge: 2016-10-14 | Disposition: A | Discharge status: Home / Self Care | Payer: Medicaid Other | Source: Ambulatory Visit | Attending: Family Medicine | Admitting: Family Medicine

## 2016-10-14 DIAGNOSIS — O34219 Maternal care for unspecified type scar from previous cesarean delivery: Secondary | ICD-10-CM | POA: Insufficient documentation

## 2016-10-14 DIAGNOSIS — Z3A38 38 weeks gestation of pregnancy: Secondary | ICD-10-CM | POA: Insufficient documentation

## 2016-10-14 DIAGNOSIS — O99213 Obesity complicating pregnancy, third trimester: Secondary | ICD-10-CM | POA: Insufficient documentation

## 2016-10-14 DIAGNOSIS — Z6835 Body mass index (BMI) 35.0-35.9, adult: Secondary | ICD-10-CM | POA: Insufficient documentation

## 2016-10-14 DIAGNOSIS — O0993 Supervision of high risk pregnancy, unspecified, third trimester: Secondary | ICD-10-CM | POA: Diagnosis not present

## 2016-10-14 DIAGNOSIS — Z862 Personal history of diseases of the blood and blood-forming organs and certain disorders involving the immune mechanism: Secondary | ICD-10-CM | POA: Diagnosis not present

## 2016-10-14 DIAGNOSIS — O09299 Supervision of pregnancy with other poor reproductive or obstetric history, unspecified trimester: Secondary | ICD-10-CM | POA: Diagnosis not present

## 2016-10-14 DIAGNOSIS — O24410 Gestational diabetes mellitus in pregnancy, diet controlled: Secondary | ICD-10-CM | POA: Diagnosis present

## 2016-10-15 ENCOUNTER — Ambulatory Visit (INDEPENDENT_AMBULATORY_CARE_PROVIDER_SITE_OTHER): Payer: Medicaid Other | Admitting: Family Medicine

## 2016-10-15 VITALS — BP 116/73 | HR 99 | Wt 245.2 lb

## 2016-10-15 DIAGNOSIS — Z98891 History of uterine scar from previous surgery: Secondary | ICD-10-CM

## 2016-10-15 DIAGNOSIS — O99519 Diseases of the respiratory system complicating pregnancy, unspecified trimester: Principal | ICD-10-CM

## 2016-10-15 DIAGNOSIS — O24410 Gestational diabetes mellitus in pregnancy, diet controlled: Secondary | ICD-10-CM

## 2016-10-15 DIAGNOSIS — O99513 Diseases of the respiratory system complicating pregnancy, third trimester: Secondary | ICD-10-CM

## 2016-10-15 DIAGNOSIS — Z8759 Personal history of other complications of pregnancy, childbirth and the puerperium: Secondary | ICD-10-CM

## 2016-10-15 DIAGNOSIS — O0993 Supervision of high risk pregnancy, unspecified, third trimester: Secondary | ICD-10-CM

## 2016-10-15 DIAGNOSIS — J45909 Unspecified asthma, uncomplicated: Secondary | ICD-10-CM

## 2016-10-15 NOTE — Progress Notes (Signed)
   PRENATAL VISIT NOTE  Subjective:  Janet [name] is a 31 y.o. G3P1012 at [redacted]w[redacted]d being seen today for ongoing prenatal care.  She is currently monitored for the following issues for this low-risk pregnancy and has Supervision of high-risk pregnancy; History of gestational hypertension; History of cesarean delivery; Obesity in pregnancy; Sickle cell trait (HCC); Migraine; Obesity, Class II, BMI 35-39.9; Asthma affecting pregnancy, antepartum; and Gestational diabetes mellitus, antepartum on her problem list.  Patient reports no complaints.  Contractions: Irregular. Vag. Bleeding: None.  Movement: Present. Denies leaking of fluid.   The following portions of the patient's history were reviewed and updated as appropriate: allergies, current medications, past family history, past medical history, past social history, past surgical history and problem list. Problem list updated.  Objective:   Vitals:   10/15/16 1138  BP: 116/73  Pulse: 99  Weight: 245 lb 3.2 oz (111.2 kg)    Fetal Status: Fetal Heart Rate (bpm): 150   Movement: Present     General:  Alert, oriented and cooperative. Patient is in no acute distress.  Skin: Skin is warm and dry. No rash noted.   Cardiovascular: Normal heart rate noted  Respiratory: Normal respiratory effort, no problems with respiration noted  Abdomen: Soft, gravid, appropriate for gestational age.  Pain/Pressure: Present     Pelvic: Cervical exam deferred        Extremities: Normal range of motion.  Edema: None  Mental Status:  Normal mood and affect. Normal behavior. Normal judgment and thought content.   Reviewed baby scripts and BS range has been  Last check was on 9/29  81, 92, 114  Reports poor appetite  Assessment and Plan:  Pregnancy: G3P1012 at [redacted]w[redacted]d  1. Asthma affecting pregnancy, antepartum No SOB/sx today  2. Diet controlled gestational diabetes mellitus (GDM), antepartum EFW 82nd% at [redacted]w[redacted]d BS log reviewed today-- encouraged to eat  small frequent meals and to check BS. Last checked her sugars on 9/29. Discussed importance of regular nutrition  3. Supervision of high risk pregnancy in third trimester UTD currently  4. History of gestational hypertension BP WNL today  5. History of cesarean delivery RCS scheduled on 10/5  Term labor symptoms and general obstetric precautions including but not limited to vaginal bleeding, contractions, leaking of fluid and fetal movement were reviewed in detail with the patient. Please refer to After Visit Summary for other counseling recommendations.  Return in about 6 years (around 10/16/2022).   Federico Flake, MD

## 2016-10-16 ENCOUNTER — Encounter (HOSPITAL_COMMUNITY)
Admission: RE | Admit: 2016-10-16 | Discharge: 2016-10-16 | Disposition: A | Discharge status: Home / Self Care | Payer: Medicaid Other | Source: Ambulatory Visit | Attending: Obstetrics & Gynecology | Admitting: Obstetrics & Gynecology

## 2016-10-16 LAB — CBC
HEMATOCRIT: 30.5 % — AB (ref 36.0–46.0)
Hemoglobin: 10.2 g/dL — ABNORMAL LOW (ref 12.0–15.0)
MCH: 25.6 pg — ABNORMAL LOW (ref 26.0–34.0)
MCHC: 33.4 g/dL (ref 30.0–36.0)
MCV: 76.6 fL — ABNORMAL LOW (ref 78.0–100.0)
PLATELETS: 239 10*3/uL (ref 150–400)
RBC: 3.98 MIL/uL (ref 3.87–5.11)
RDW: 14.6 % (ref 11.5–15.5)
WBC: 7.6 10*3/uL (ref 4.0–10.5)

## 2016-10-16 LAB — TYPE AND SCREEN
ABO/RH(D): O POS
ANTIBODY SCREEN: NEGATIVE

## 2016-10-16 NOTE — Patient Instructions (Signed)
Janet [name]  10/16/2016   Your procedure is scheduled on:  10/17/2016  Enter through the Main Entrance of [facility] at 0730 AM.  Pick up the phone at the desk and dial [phone]  Call this number if you have problems the morning of surgery: [phone]   Remember:   Do not eat food:After Midnight.  Do not drink clear liquids: After Midnight.  Take these medicines the morning of surgery with A SIP OF WATER: none.  Please bring your inhaler with you.   Do not wear jewelry, make-up or nail polish.  Do not wear lotions, powders, or perfumes. Do not wear deodorant.  Do not shave 48 hours prior to surgery.  Do not bring valuables to the hospital.  [facility] is not   responsible for any belongings or valuables brought to the hospital.  Contacts, dentures or bridgework may not be worn into surgery.  Leave suitcase in the car. After surgery it may be brought to your room.  For patients admitted to the hospital, checkout time is 11:00 AM the day of              discharge.    N/A   Please read over the following fact sheets that you were given:   Surgical Site Infection Prevention

## 2016-10-17 ENCOUNTER — Encounter (HOSPITAL_COMMUNITY): Payer: Self-pay | Admitting: Anesthesiology

## 2016-10-17 ENCOUNTER — Encounter (HOSPITAL_COMMUNITY)
Admission: RE | Disposition: A | Discharge status: Home / Self Care | Payer: Self-pay | Source: Ambulatory Visit | Attending: Obstetrics & Gynecology

## 2016-10-17 ENCOUNTER — Inpatient Hospital Stay (HOSPITAL_COMMUNITY)
Admission: RE | Admit: 2016-10-17 | Discharge: 2016-10-20 | DRG: 788 | Disposition: A | Discharge status: Home / Self Care | Payer: Medicaid Other | Source: Ambulatory Visit | Attending: Obstetrics & Gynecology | Admitting: Obstetrics & Gynecology

## 2016-10-17 ENCOUNTER — Inpatient Hospital Stay (HOSPITAL_COMMUNITY): Payer: Medicaid Other

## 2016-10-17 DIAGNOSIS — D573 Sickle-cell trait: Secondary | ICD-10-CM | POA: Diagnosis present

## 2016-10-17 DIAGNOSIS — J45909 Unspecified asthma, uncomplicated: Secondary | ICD-10-CM | POA: Diagnosis present

## 2016-10-17 DIAGNOSIS — O24420 Gestational diabetes mellitus in childbirth, diet controlled: Secondary | ICD-10-CM | POA: Diagnosis present

## 2016-10-17 DIAGNOSIS — Z3A39 39 weeks gestation of pregnancy: Secondary | ICD-10-CM

## 2016-10-17 DIAGNOSIS — O134 Gestational [pregnancy-induced] hypertension without significant proteinuria, complicating childbirth: Secondary | ICD-10-CM | POA: Diagnosis present

## 2016-10-17 DIAGNOSIS — O34211 Maternal care for low transverse scar from previous cesarean delivery: Secondary | ICD-10-CM | POA: Diagnosis present

## 2016-10-17 DIAGNOSIS — O9902 Anemia complicating childbirth: Secondary | ICD-10-CM | POA: Diagnosis present

## 2016-10-17 DIAGNOSIS — O0990 Supervision of high risk pregnancy, unspecified, unspecified trimester: Secondary | ICD-10-CM

## 2016-10-17 DIAGNOSIS — E669 Obesity, unspecified: Secondary | ICD-10-CM | POA: Diagnosis present

## 2016-10-17 DIAGNOSIS — O24419 Gestational diabetes mellitus in pregnancy, unspecified control: Secondary | ICD-10-CM | POA: Diagnosis present

## 2016-10-17 DIAGNOSIS — O99519 Diseases of the respiratory system complicating pregnancy, unspecified trimester: Secondary | ICD-10-CM

## 2016-10-17 DIAGNOSIS — Z6836 Body mass index (BMI) 36.0-36.9, adult: Secondary | ICD-10-CM

## 2016-10-17 DIAGNOSIS — Z98891 History of uterine scar from previous surgery: Secondary | ICD-10-CM

## 2016-10-17 DIAGNOSIS — O99214 Obesity complicating childbirth: Secondary | ICD-10-CM | POA: Diagnosis present

## 2016-10-17 DIAGNOSIS — O9952 Diseases of the respiratory system complicating childbirth: Secondary | ICD-10-CM | POA: Diagnosis present

## 2016-10-17 LAB — RPR: RPR: NONREACTIVE

## 2016-10-17 LAB — GLUCOSE, CAPILLARY
GLUCOSE-CAPILLARY: 91 mg/dL (ref 65–99)
GLUCOSE-CAPILLARY: 106 mg/dL — AB (ref 65–99)

## 2016-10-17 SURGERY — Surgical Case
Anesthesia: Spinal

## 2016-10-17 MED ORDER — DEXAMETHASONE SODIUM PHOSPHATE 4 MG/ML IJ SOLN
INTRAMUSCULAR | Status: AC
  Filled 2016-10-17: qty 1

## 2016-10-17 MED ORDER — NALBUPHINE HCL 10 MG/ML IJ SOLN: 5.0000 mg | INTRAMUSCULAR | Status: DC | PRN

## 2016-10-17 MED ORDER — SIMETHICONE 80 MG PO CHEW
80.0000 mg | CHEWABLE_TABLET | Freq: Three times a day (TID) | ORAL | Status: DC
  Administered 2016-10-18 – 2016-10-20 (×7): 80 mg via ORAL
  Filled 2016-10-17 (×8): qty 1

## 2016-10-17 MED ORDER — SIMETHICONE 80 MG PO CHEW: 80.0000 mg | CHEWABLE_TABLET | ORAL | Status: DC | PRN

## 2016-10-17 MED ORDER — HYDROMORPHONE HCL 1 MG/ML IJ SOLN
1.0000 mg | INTRAMUSCULAR | Status: AC | PRN
  Administered 2016-10-17: 1 mg via INTRAVENOUS
  Filled 2016-10-17: qty 1

## 2016-10-17 MED ORDER — SOD CITRATE-CITRIC ACID 500-334 MG/5ML PO SOLN
30.0000 mL | Freq: Once | ORAL | Status: AC
  Administered 2016-10-17: 30 mL via ORAL
  Filled 2016-10-17: qty 15

## 2016-10-17 MED ORDER — FENTANYL CITRATE (PF) 100 MCG/2ML IJ SOLN
INTRAMUSCULAR | Status: AC
  Filled 2016-10-17: qty 2

## 2016-10-17 MED ORDER — BUPIVACAINE IN DEXTROSE 0.75-8.25 % IT SOLN
INTRATHECAL | Status: DC | PRN
  Administered 2016-10-17: 1.8 mL via INTRATHECAL

## 2016-10-17 MED ORDER — SIMETHICONE 80 MG PO CHEW
80.0000 mg | CHEWABLE_TABLET | ORAL | Status: DC
  Administered 2016-10-18 – 2016-10-19 (×3): 80 mg via ORAL
  Filled 2016-10-17 (×3): qty 1

## 2016-10-17 MED ORDER — DEXAMETHASONE SODIUM PHOSPHATE 4 MG/ML IJ SOLN
INTRAMUSCULAR | Status: DC | PRN
  Administered 2016-10-17: 4 mg via INTRAVENOUS

## 2016-10-17 MED ORDER — DIPHENHYDRAMINE HCL 25 MG PO CAPS: 25.0000 mg | ORAL_CAPSULE | ORAL | Status: DC | PRN

## 2016-10-17 MED ORDER — SCOPOLAMINE 1 MG/3DAYS TD PT72
1.0000 | MEDICATED_PATCH | Freq: Once | TRANSDERMAL | Status: DC
  Administered 2016-10-17: 1.5 mg via TRANSDERMAL
  Filled 2016-10-17: qty 1

## 2016-10-17 MED ORDER — PHENYLEPHRINE 8 MG IN D5W 100 ML (0.08MG/ML) PREMIX OPTIME
INJECTION | INTRAVENOUS | Status: DC | PRN
  Administered 2016-10-17: 60 ug/min via INTRAVENOUS

## 2016-10-17 MED ORDER — SENNOSIDES-DOCUSATE SODIUM 8.6-50 MG PO TABS
2.0000 | ORAL_TABLET | ORAL | Status: DC
  Administered 2016-10-18 – 2016-10-19 (×3): 2 via ORAL
  Filled 2016-10-17 (×3): qty 2

## 2016-10-17 MED ORDER — LACTATED RINGERS IV SOLN
INTRAVENOUS | Status: DC | PRN
  Administered 2016-10-17: 11:00:00 via INTRAVENOUS

## 2016-10-17 MED ORDER — FENTANYL CITRATE (PF) 100 MCG/2ML IJ SOLN: 25.0000 ug | INTRAMUSCULAR | Status: DC | PRN

## 2016-10-17 MED ORDER — PRENATAL MULTIVITAMIN CH
1.0000 | ORAL_TABLET | Freq: Every day | ORAL | Status: DC
  Administered 2016-10-18 – 2016-10-20 (×3): 1 via ORAL
  Filled 2016-10-17 (×3): qty 1

## 2016-10-17 MED ORDER — LACTATED RINGERS IV SOLN
INTRAVENOUS | Status: DC
  Administered 2016-10-17 (×3): via INTRAVENOUS

## 2016-10-17 MED ORDER — MEPERIDINE HCL 25 MG/ML IJ SOLN: 6.2500 mg | INTRAMUSCULAR | Status: DC | PRN

## 2016-10-17 MED ORDER — LACTATED RINGERS IV SOLN: INTRAVENOUS | Status: DC

## 2016-10-17 MED ORDER — CEFAZOLIN SODIUM-DEXTROSE 2-4 GM/100ML-% IV SOLN
2.0000 g | INTRAVENOUS | Status: DC
  Filled 2016-10-17: qty 100

## 2016-10-17 MED ORDER — WITCH HAZEL-GLYCERIN EX PADS: 1.0000 "application " | MEDICATED_PAD | CUTANEOUS | Status: DC | PRN

## 2016-10-17 MED ORDER — MORPHINE SULFATE (PF) 0.5 MG/ML IJ SOLN
INTRAMUSCULAR | Status: AC
  Filled 2016-10-17: qty 10

## 2016-10-17 MED ORDER — OXYTOCIN 10 UNIT/ML IJ SOLN
INTRAMUSCULAR | Status: AC
  Filled 2016-10-17: qty 4

## 2016-10-17 MED ORDER — SODIUM CHLORIDE 0.9 % IR SOLN
Status: DC | PRN
  Administered 2016-10-17: 1000 mL

## 2016-10-17 MED ORDER — MORPHINE SULFATE (PF) 0.5 MG/ML IJ SOLN
INTRAMUSCULAR | Status: DC | PRN
  Administered 2016-10-17: .2 mg via INTRATHECAL

## 2016-10-17 MED ORDER — OXYCODONE HCL 5 MG PO TABS
10.0000 mg | ORAL_TABLET | Freq: Once | ORAL | Status: AC
  Administered 2016-10-17: 10 mg via ORAL
  Filled 2016-10-17: qty 2

## 2016-10-17 MED ORDER — NALBUPHINE HCL 10 MG/ML IJ SOLN
5.0000 mg | INTRAMUSCULAR | Status: DC | PRN
  Administered 2016-10-17: 5 mg via INTRAVENOUS

## 2016-10-17 MED ORDER — PHENYLEPHRINE HCL 10 MG/ML IJ SOLN
INTRAMUSCULAR | Status: DC | PRN
  Administered 2016-10-17 (×2): 80 ug via INTRAVENOUS

## 2016-10-17 MED ORDER — DIPHENHYDRAMINE HCL 50 MG/ML IJ SOLN
12.5000 mg | INTRAMUSCULAR | Status: DC | PRN
  Administered 2016-10-17 – 2016-10-18 (×2): 12.5 mg via INTRAVENOUS
  Filled 2016-10-17 (×2): qty 1

## 2016-10-17 MED ORDER — LACTATED RINGERS IV SOLN
INTRAVENOUS | Status: DC
  Administered 2016-10-17 – 2016-10-18 (×2): via INTRAVENOUS

## 2016-10-17 MED ORDER — ACETAMINOPHEN 325 MG PO TABS: 650.0000 mg | ORAL_TABLET | ORAL | Status: DC | PRN

## 2016-10-17 MED ORDER — DIPHENHYDRAMINE HCL 25 MG PO CAPS
25.0000 mg | ORAL_CAPSULE | Freq: Four times a day (QID) | ORAL | Status: DC | PRN
  Administered 2016-10-17 – 2016-10-18 (×2): 25 mg via ORAL
  Filled 2016-10-17 (×2): qty 1

## 2016-10-17 MED ORDER — TETANUS-DIPHTH-ACELL PERTUSSIS 5-2.5-18.5 LF-MCG/0.5 IM SUSP: 0.5000 mL | Freq: Once | INTRAMUSCULAR | Status: DC

## 2016-10-17 MED ORDER — OXYTOCIN 40 UNITS IN LACTATED RINGERS INFUSION - SIMPLE MED: 2.5000 [IU]/h | INTRAVENOUS | Status: AC

## 2016-10-17 MED ORDER — METOCLOPRAMIDE HCL 5 MG/ML IJ SOLN: 10.0000 mg | Freq: Once | INTRAMUSCULAR | Status: DC | PRN

## 2016-10-17 MED ORDER — BUPIVACAINE HCL (PF) 0.5 % IJ SOLN
INTRAMUSCULAR | Status: DC | PRN
  Administered 2016-10-17: 20 mL

## 2016-10-17 MED ORDER — CEFAZOLIN SODIUM-DEXTROSE 2-3 GM-% IV SOLR
INTRAVENOUS | Status: DC | PRN
  Administered 2016-10-17: 2 g via INTRAVENOUS

## 2016-10-17 MED ORDER — DIBUCAINE 1 % RE OINT: 1.0000 "application " | TOPICAL_OINTMENT | RECTAL | Status: DC | PRN

## 2016-10-17 MED ORDER — SODIUM CHLORIDE 0.9% FLUSH: 3.0000 mL | INTRAVENOUS | Status: DC | PRN

## 2016-10-17 MED ORDER — BUPIVACAINE IN DEXTROSE 0.75-8.25 % IT SOLN
INTRATHECAL | Status: AC
  Filled 2016-10-17: qty 2

## 2016-10-17 MED ORDER — ZOLPIDEM TARTRATE 5 MG PO TABS
5.0000 mg | ORAL_TABLET | Freq: Every evening | ORAL | Status: DC | PRN
  Administered 2016-10-19: 5 mg via ORAL
  Filled 2016-10-17: qty 1

## 2016-10-17 MED ORDER — NALBUPHINE HCL 10 MG/ML IJ SOLN
5.0000 mg | INTRAMUSCULAR | Status: DC | PRN
  Filled 2016-10-17: qty 1

## 2016-10-17 MED ORDER — OXYCODONE-ACETAMINOPHEN 5-325 MG PO TABS
1.0000 | ORAL_TABLET | ORAL | Status: DC | PRN
  Administered 2016-10-18 – 2016-10-20 (×10): 2 via ORAL
  Filled 2016-10-17 (×10): qty 2

## 2016-10-17 MED ORDER — MENTHOL 3 MG MT LOZG: 1.0000 | LOZENGE | OROMUCOSAL | Status: DC | PRN

## 2016-10-17 MED ORDER — FENTANYL CITRATE (PF) 100 MCG/2ML IJ SOLN
INTRAMUSCULAR | Status: DC | PRN
  Administered 2016-10-17: 20 ug via INTRATHECAL

## 2016-10-17 MED ORDER — ACETAMINOPHEN 500 MG PO TABS
1000.0000 mg | ORAL_TABLET | Freq: Three times a day (TID) | ORAL | Status: DC
  Administered 2016-10-17 – 2016-10-18 (×3): 1000 mg via ORAL
  Filled 2016-10-17 (×3): qty 2

## 2016-10-17 MED ORDER — DIPHENHYDRAMINE HCL 50 MG/ML IJ SOLN: 12.5000 mg | INTRAMUSCULAR | Status: DC | PRN

## 2016-10-17 MED ORDER — NALBUPHINE HCL 10 MG/ML IJ SOLN
2.5000 mg | Freq: Once | INTRAMUSCULAR | Status: AC
  Administered 2016-10-17: 2.5 mg via INTRAVENOUS

## 2016-10-17 MED ORDER — PHENYLEPHRINE 8 MG IN D5W 100 ML (0.08MG/ML) PREMIX OPTIME
INJECTION | INTRAVENOUS | Status: AC
  Filled 2016-10-17: qty 100

## 2016-10-17 MED ORDER — NALOXONE HCL 0.4 MG/ML IJ SOLN: 0.4000 mg | INTRAMUSCULAR | Status: DC | PRN

## 2016-10-17 MED ORDER — OXYTOCIN 10 UNIT/ML IJ SOLN
INTRAVENOUS | Status: DC | PRN
  Administered 2016-10-17: 40 [IU] via INTRAVENOUS

## 2016-10-17 MED ORDER — PHENYLEPHRINE 40 MCG/ML (10ML) SYRINGE FOR IV PUSH (FOR BLOOD PRESSURE SUPPORT)
PREFILLED_SYRINGE | INTRAVENOUS | Status: AC
  Filled 2016-10-17: qty 10

## 2016-10-17 MED ORDER — COCONUT OIL OIL: 1.0000 "application " | TOPICAL_OIL | Status: DC | PRN

## 2016-10-17 MED ORDER — NALOXONE HCL 2 MG/2ML IJ SOSY
1.0000 ug/kg/h | PREFILLED_SYRINGE | INTRAVENOUS | Status: DC | PRN
  Filled 2016-10-17: qty 2

## 2016-10-17 MED ORDER — NALBUPHINE HCL 10 MG/ML IJ SOLN: 5.0000 mg | Freq: Once | INTRAMUSCULAR | Status: DC | PRN

## 2016-10-17 MED ORDER — NALBUPHINE HCL 10 MG/ML IJ SOLN
INTRAMUSCULAR | Status: AC
  Administered 2016-10-17: 2.5 mg via SUBCUTANEOUS
  Filled 2016-10-17: qty 1

## 2016-10-17 MED ORDER — NALBUPHINE HCL 10 MG/ML IJ SOLN
5.0000 mg | Freq: Once | INTRAMUSCULAR | Status: DC | PRN
  Administered 2016-10-17: 2.5 mg via SUBCUTANEOUS

## 2016-10-17 MED ORDER — BUPIVACAINE HCL (PF) 0.5 % IJ SOLN
INTRAMUSCULAR | Status: AC
  Filled 2016-10-17: qty 30

## 2016-10-17 SURGICAL SUPPLY — 35 items
BARRIER ADHS 3X4 INTERCEED (GAUZE/BANDAGES/DRESSINGS) IMPLANT
CHLORAPREP W/TINT 26ML (MISCELLANEOUS) ×3 IMPLANT
CLAMP CORD UMBIL (MISCELLANEOUS) ×3 IMPLANT
CLOTH BEACON ORANGE TIMEOUT ST (SAFETY) ×3 IMPLANT
DRAPE C SECTION CLR SCREEN (DRAPES) ×3 IMPLANT
DRSG OPSITE POSTOP 4X10 (GAUZE/BANDAGES/DRESSINGS) ×3 IMPLANT
ELECT REM PT RETURN 9FT ADLT (ELECTROSURGICAL) ×3
ELECTRODE REM PT RTRN 9FT ADLT (ELECTROSURGICAL) ×1 IMPLANT
EXTRACTOR VACUUM KIWI (MISCELLANEOUS) ×3 IMPLANT
GLOVE BIO SURGEON STRL SZ 6.5 (GLOVE) ×2 IMPLANT
GLOVE BIO SURGEONS STRL SZ 6.5 (GLOVE) ×1
GLOVE BIOGEL PI IND STRL 7.0 (GLOVE) ×2 IMPLANT
GLOVE BIOGEL PI INDICATOR 7.0 (GLOVE) ×4
GOWN STRL REUS W/TWL LRG LVL3 (GOWN DISPOSABLE) ×9 IMPLANT
KIT ABG SYR 3ML LUER SLIP (SYRINGE) ×3 IMPLANT
NEEDLE HYPO 22GX1.5 SAFETY (NEEDLE) IMPLANT
NEEDLE HYPO 25X5/8 SAFETYGLIDE (NEEDLE) ×3 IMPLANT
NS IRRIG 1000ML POUR BTL (IV SOLUTION) ×3 IMPLANT
PACK C SECTION WH (CUSTOM PROCEDURE TRAY) ×3 IMPLANT
PAD ABD 8X10 STRL (GAUZE/BANDAGES/DRESSINGS) ×6 IMPLANT
PAD OB MATERNITY 4.3X12.25 (PERSONAL CARE ITEMS) ×6 IMPLANT
PENCIL SMOKE EVAC W/HOLSTER (ELECTROSURGICAL) ×3 IMPLANT
RETRACTOR WND ALEXIS 25 LRG (MISCELLANEOUS) ×1 IMPLANT
RTRCTR WOUND ALEXIS 25CM LRG (MISCELLANEOUS) ×3
SUT PLAIN 2 0 (SUTURE) ×4
SUT PLAIN ABS 2-0 CT1 27XMFL (SUTURE) ×2 IMPLANT
SUT VIC AB 0 CT1 36 (SUTURE) ×18 IMPLANT
SUT VIC AB 2-0 CT1 27 (SUTURE) ×2
SUT VIC AB 2-0 CT1 TAPERPNT 27 (SUTURE) ×1 IMPLANT
SUT VIC AB 4-0 PS2 27 (SUTURE) ×3 IMPLANT
SYR CONTROL 10ML LL (SYRINGE) ×3 IMPLANT
TAPE CLOTH SURG 4X10 WHT LF (GAUZE/BANDAGES/DRESSINGS) ×3 IMPLANT
TOWEL OR 17X24 6PK STRL BLUE (TOWEL DISPOSABLE) ×3 IMPLANT
TRAY FOLEY BAG SILVER LF 14FR (SET/KITS/TRAYS/PACK) ×3 IMPLANT
WATER STERILE IRR 1000ML POUR (IV SOLUTION) ×3 IMPLANT

## 2016-10-17 NOTE — Anesthesia Postprocedure Evaluation (Signed)
Anesthesia Post Note  Patient: Janet [name]  Procedure(s) Performed: REPEAT CESAREAN SECTION (N/A )     Patient location during evaluation: PACU Anesthesia Type: Spinal Level of consciousness: oriented and awake and alert Pain management: pain level controlled Vital Signs Assessment: post-procedure vital signs reviewed and stable Respiratory status: spontaneous breathing, respiratory function stable and nonlabored ventilation Cardiovascular status: blood pressure returned to baseline and stable Postop Assessment: no headache, no backache, no apparent nausea or vomiting, spinal receding and patient able to bend at knees Anesthetic complications: no    Last Vitals:  Vitals:   10/17/16 1300 10/17/16 1304  BP: 108/68   Pulse: 66 72  Resp: 15 16  Temp:    SpO2: 99% 100%    Last Pain:  Vitals:   10/17/16 1245  TempSrc:   PainSc: 0-No pain   Pain Goal:                 Lin Glazier A.

## 2016-10-17 NOTE — Op Note (Signed)
Janet [name] PROCEDURE DATE: 10/17/2016  PREOPERATIVE DIAGNOSES: Intrauterine pregnancy at [redacted]w[redacted]d weeks gestation; previous uterine incision low transverse  POSTOPERATIVE DIAGNOSES: The same  PROCEDURE: Repeat Low Transverse Cesarean Section  SURGEON:  Dr. Scheryl Darter  ASSISTANT:  Dr. Rolm Bookbinder  ANESTHESIOLOGY TEAM: Anesthesiologist: Mal Amabile, MD CRNA: Graciela Husbands, CRNA  INDICATIONS: Janet [name] is a 31 y.o. [phone] at [redacted]w[redacted]d here for cesarean section secondary to the indications listed under preoperative diagnoses; please see preoperative note for further details.  The risks of cesarean section were discussed with the patient including but were not limited to: bleeding which may require transfusion or reoperation; infection which may require antibiotics; injury to bowel, bladder, ureters or other surrounding organs; injury to the fetus; need for additional procedures including hysterectomy in the event of a life-threatening hemorrhage; placental abnormalities wth subsequent pregnancies, incisional problems, thromboembolic phenomenon and other postoperative/anesthesia complications.   The patient concurred with the proposed plan, giving informed written consent for the procedure.    FINDINGS:  Viable female infant in cephalic presentation.  Apgars 7 and 9.  Clear amniotic fluid.  Intact placenta, three vessel cord.  Normal uterus, fallopian tubes and ovaries bilaterally.  ANESTHESIA: Spinal INTRAVENOUS FLUIDS: 3000 ml   ESTIMATED BLOOD LOSS: 800 ml URINE OUTPUT:  200 ml SPECIMENS: Placenta sent to L&D COMPLICATIONS: None immediate  PROCEDURE IN DETAIL:  The patient preoperatively received intravenous antibiotics and had sequential compression devices applied to her lower extremities.  She was then taken to the operating room where spinal anesthesia was administered and was found to be adequate. She was then placed in a dorsal supine position with a leftward tilt, and prepped  and draped in a sterile manner.  A foley catheter was placed into her bladder and attached to constant gravity.  After an adequate timeout was performed, a Pfannenstiel skin incision was made with scalpel over her preexisting scar and carried through to the underlying layer of fascia. The fascia was incised in the midline, and this incision was extended bilaterally using the Mayo scissors.  Kocher clamps were applied to the superior aspect of the fascial incision and the underlying rectus muscles were dissected off bluntly.  A similar process was carried out on the inferior aspect of the fascial incision. The rectus muscles were separated in the midline bluntly and the peritoneum was entered bluntly. Sharp dissection used to detach peritoneum secondary to scarring. Considerable amount of adhesions of anterior uterus to abdominal wall, requiring sharp dissection with metzenbaum scissor. The bladder was dissected from the uterine surface and bladder blade was placed. Because muscle was adhered to uterus on the right, made incision through rectus muscle on right with cautery. Attention was turned to the lower uterine segment where a low transverse hysterotomy was made with a scalpel and extended bilaterally bluntly. The fetal head was elevated but in order to assist with delivery, we used Kiwi vacuum. We had to extend uterine incision with scissors on the right.  The infant was successfully delivered using the vacuum, the cord was clamped and cut after one minute, and the infant was handed over to the awaiting neonatology team. Uterine massage was then administered, and the placenta delivered intact with a three-vessel cord. The uterus was then cleared of clots and debris.  The hysterotomy was closed with 0 Vicryl in a running locked fashion, and an imbricating layer was also placed with 0 Vicryl.  Figure-of-eight 0 Vicryl serosal stitches were placed to help with hemostasis.  The pelvis was  cleared of all clot and  debris. Hemostasis was confirmed on all surfaces.  The rectus muscle was closed with a 0 Vicryl stitch. The fascia was then closed using 0 Vicryl  in a running fashion.  The subcutaneous layer was irrigated, then reapproximated with 2-0 plain gut, and 30 ml of 0.5% Marcaine was injected subcutaneously around the incision.  The skin was closed with a 4-0 Vicryl subcuticular stitch. The patient tolerated the procedure well. Sponge, lap, instrument and needle counts were correct x 3.  She was taken to the recovery room in stable condition.    Rolm Bookbinder, DO [place] Fellow

## 2016-10-17 NOTE — Lactation Note (Signed)
This note was copied from a baby's chart. Lactation Consultation Note  Patient Name: Janet [name] ZOXWR'U Date: 10/17/2016 Reason for consult: Initial assessment Baby at 7 hr of life. Mom is worried that baby is not waking to feed and she is concerned about low milk supply. Mom reports she bf her 1st child but did not offer details. Mom was very sleepy at this visit. Discussed baby behavior, feeding frequency, manual expression/spoon feeding, baby belly size, and voids. Given lactation handouts. Aware of OP services and support group. Mom will call for staff help if baby continues to have trouble with latching.     Maternal Data Does the patient have breastfeeding experience prior to this delivery?: Yes  Feeding    LATCH Score                   Interventions    Lactation Tools Discussed/Used     Consult Status Consult Status: Follow-up Date: 10/18/16 Follow-up type: In-patient    Rulon Eisenmenger 10/17/2016, 6:21 PM

## 2016-10-17 NOTE — Anesthesia Preprocedure Evaluation (Addendum)
Anesthesia Evaluation  Patient identified by MRN, date of birth, ID band Patient awake    Reviewed: Allergy & Precautions, NPO status , Patient's Chart, lab work & pertinent test results  Airway Mallampati: III  TM Distance: >3 FB Neck ROM: Full    Dental no notable dental hx. (+) Teeth Intact   Pulmonary asthma ,    Pulmonary exam normal breath sounds clear to auscultation       Cardiovascular negative cardio ROS Normal cardiovascular exam Rhythm:Regular Rate:Normal     Neuro/Psych  Headaches, PSYCHIATRIC DISORDERS Depression    GI/Hepatic Neg liver ROS, GERD  Medicated and Controlled,  Endo/Other  diabetes, Well Controlled, GestationalObesity  Renal/GU negative Renal ROS  negative genitourinary   Musculoskeletal negative musculoskeletal ROS (+)   Abdominal (+) + obese,   Peds  Hematology  (+) Sickle cell trait and anemia ,   Anesthesia Other Findings   Reproductive/Obstetrics (+) Pregnancy Previous C/Section                            Anesthesia Physical Anesthesia Plan  ASA: II  Anesthesia Plan: Spinal   Post-op Pain Management:    Induction:   PONV Risk Score and Plan: 4 or greater and Ondansetron, Dexamethasone, Midazolam, Scopolamine patch - Pre-op and Propofol infusion  Airway Management Planned: Natural Airway  Additional Equipment:   Intra-op Plan:   Post-operative Plan:   Informed Consent: I have reviewed the patients History and Physical, chart, labs and discussed the procedure including the risks, benefits and alternatives for the proposed anesthesia with the patient or authorized representative who has indicated his/her understanding and acceptance.   Dental advisory given  Plan Discussed with: Anesthesiologist, CRNA and Surgeon  Anesthesia Plan Comments:        Anesthesia Quick Evaluation

## 2016-10-17 NOTE — Transfer of Care (Signed)
Immediate Anesthesia Transfer of Care Note  Patient: Janet [name]  Procedure(s) Performed: REPEAT CESAREAN SECTION (N/A )  Patient Location: PACU  Anesthesia Type:Spinal  Level of Consciousness: awake, alert  and oriented  Airway & Oxygen Therapy: Patient Spontanous Breathing  Post-op Assessment: Report given to RN and Post -op Vital signs reviewed and stable  Post vital signs: Reviewed and stable  Last Vitals:  Vitals:   10/17/16 0813  BP: 120/72  Pulse: 93  Resp: 16  Temp: 37.1 C    Last Pain:  Vitals:   10/17/16 0813  TempSrc: Oral         Complications: No apparent anesthesia complications

## 2016-10-17 NOTE — Anesthesia Procedure Notes (Signed)
Spinal  Patient location during procedure: OR Start time: 10/17/2016 9:53 AM Staffing Anesthesiologist: Mal Amabile Performed: anesthesiologist  Preanesthetic Checklist Completed: patient identified, site marked, surgical consent, pre-op evaluation, timeout performed, IV checked, risks and benefits discussed and monitors and equipment checked Spinal Block Patient position: sitting Prep: site prepped and draped and DuraPrep Patient monitoring: heart rate, cardiac monitor, continuous pulse ox and blood pressure Approach: midline Location: L3-4 Injection technique: single-shot Needle Needle type: Pencan  Needle gauge: 24 G Needle length: 9 cm Needle insertion depth: 7 cm Assessment Sensory level: T4 Additional Notes Patient tolerated procedure well. Adequate sensory level.

## 2016-10-18 LAB — CBC
HEMATOCRIT: 27.3 % — AB (ref 36.0–46.0)
Hemoglobin: 9.4 g/dL — ABNORMAL LOW (ref 12.0–15.0)
MCH: 26.1 pg (ref 26.0–34.0)
MCHC: 34.4 g/dL (ref 30.0–36.0)
MCV: 75.8 fL — AB (ref 78.0–100.0)
PLATELETS: 208 10*3/uL (ref 150–400)
RBC: 3.6 MIL/uL — ABNORMAL LOW (ref 3.87–5.11)
RDW: 14.6 % (ref 11.5–15.5)
WBC: 9.4 10*3/uL (ref 4.0–10.5)

## 2016-10-18 LAB — BIRTH TISSUE RECOVERY COLLECTION (PLACENTA DONATION)

## 2016-10-18 MED ORDER — FENTANYL CITRATE (PF) 100 MCG/2ML IJ SOLN
50.0000 ug | INTRAMUSCULAR | Status: DC | PRN
  Administered 2016-10-18 – 2016-10-19 (×5): 50 ug via INTRAVENOUS
  Filled 2016-10-18 (×5): qty 2

## 2016-10-18 NOTE — Anesthesia Postprocedure Evaluation (Signed)
Anesthesia Post Note  Patient: Janet [name]  Procedure(s) Performed: REPEAT CESAREAN SECTION (N/A )     Patient location during evaluation: Mother Baby Anesthesia Type: Spinal Level of consciousness: awake and alert and oriented Pain management: satisfactory to patient Vital Signs Assessment: post-procedure vital signs reviewed and stable Respiratory status: spontaneous breathing and nonlabored ventilation Cardiovascular status: stable Postop Assessment: no headache, no backache, patient able to bend at knees, no signs of nausea or vomiting and adequate PO intake Anesthetic complications: no    Last Vitals:  Vitals:   10/18/16 0123 10/18/16 0500  BP: 118/64 107/61  Pulse: 82 64  Resp: 18 18  Temp:  36.7 C  SpO2: 97%     Last Pain:  Vitals:   10/18/16 0743  TempSrc:   PainSc: 10-Worst pain ever   Pain Goal: Patients Stated Pain Goal: 4 (10/17/16 1918)               [name]

## 2016-10-18 NOTE — Progress Notes (Signed)
Pt stood up at bedside.  Pt c/o dizziness and lightheadedness.  Pt instructed to lay back down and to call the nurse when ready to get oob.

## 2016-10-18 NOTE — Progress Notes (Signed)
POSTOP PROGRESS NOTE  Post Op Day 1 Subjective:  Janet [name] is a 31 y.o. [id number] [redacted]w[redacted]d s/p RLTCS.  No acute events overnight.  Pt reports problems with ambulating, voiding or po intake.  She denies nausea or vomiting.  Pain is moderately controlled.  She has not had flatus. She has not had bowel movement.  Lochia Minimal.   Objective: Blood pressure 107/61, pulse 64, temperature 98 F (36.7 C), resp. rate 18, last menstrual period 01/18/2016, SpO2 97 %, unknown if currently breastfeeding.  Physical Exam:  General: alert, cooperative and no distress Lochia:normal flow Chest: CTAB Heart: RRR no m/r/g Abdomen: +BS, soft, nontender,  Uterine Fundus: soft DVT Evaluation: No calf swelling or tenderness Extremities: no edema   Recent Labs  10/16/16 0930 10/18/16 0516  HGB 10.2* 9.4*  HCT 30.5* 27.3*    Assessment/Plan:  ASSESSMENT: Janet [name] is a 31 y.o. [id number] [redacted]w[redacted]d s/p RLTCS. Encouraged assisted ambulation and to request for pain medication as needed.   Plan for discharge tomorrow   LOS: 1 day   Renne Musca, MD PGY-2 Center for [facility], [facility]  10/18/2016, 7:32 AM

## 2016-10-18 NOTE — Addendum Note (Signed)
Addendum  created 10/18/16 0856 by Tamyrah Burbage M, CRNA   Sign clinical note    

## 2016-10-19 NOTE — Progress Notes (Signed)
POSTOP PROGRESS NOTE  Post Partum Day 2 Subjective:  Janet [name] is a 31 y.o. [id number] [redacted]w[redacted]d s/p RLTCS.  No acute events overnight.  Pt denies problems with ambulating, voiding or po intake.  She denies nausea or vomiting.  Pain is moderately controlled.  She has had flatus. She has not had bowel movement.  Lochia Minimal.   Objective: Blood pressure 109/63, pulse 78, temperature 97.9 F (36.6 C), temperature source Oral, resp. rate 18, last menstrual period 01/18/2016, SpO2 98 %, unknown if currently breastfeeding.  Physical Exam:  General: alert, cooperative and no distress Lochia:normal flow Chest: CTAB Heart: RRR no m/r/g Abdomen: +BS, soft, nontender,  Uterine Fundus: firm DVT Evaluation: No calf swelling or tenderness Extremities: no edema   Recent Labs  10/16/16 0930 10/18/16 0516  HGB 10.2* 9.4*  HCT 30.5* 27.3*    Assessment/Plan:  ASSESSMENT: Janet [name] is a 31 y.o. A5W0981 [redacted]w[redacted]d s/p RLTCS. Continues with pain. Has not been ambulating. Could consider scheduling percocet. Discussed importance of ambulation and communicating pain level as she will need to be discharged tomorrow.   Plan for discharge tomorrow   LOS: 2 days   Renne Musca, MD PGY-2 Center for [facility], [facility]  10/19/2016, 9:06 AM   OB FELLOW POSTPARTUM PROGRESS NOTE ATTESTATION  I have seen and examined this patient and agree with above documentation in the resident's note.   Frederik Pear, MD OB Fellow

## 2016-10-20 LAB — URINALYSIS, ROUTINE W REFLEX MICROSCOPIC
BILIRUBIN URINE: NEGATIVE
GLUCOSE, UA: NEGATIVE mg/dL
Ketones, ur: NEGATIVE mg/dL
Leukocytes, UA: NEGATIVE
Nitrite: NEGATIVE
Protein, ur: 30 mg/dL — AB
SPECIFIC GRAVITY, URINE: 1.02 (ref 1.005–1.030)
pH: 5.5 (ref 5.0–8.0)

## 2016-10-20 LAB — URINALYSIS, MICROSCOPIC (REFLEX)

## 2016-10-20 MED ORDER — OXYCODONE-ACETAMINOPHEN 5-325 MG PO TABS: 1.0000 | ORAL_TABLET | ORAL | 0 refills | Status: AC | PRN

## 2016-10-20 NOTE — Discharge Summary (Signed)
OB Discharge Summary     Patient Name: Janet [name] DOB: [birth year] MRN: [id number]  Date of admission: 10/17/2016 Delivering MD: Adam Phenix   Date of discharge: 10/20/2016  Admitting diagnosis: RCS Intrauterine pregnancy: [redacted]w[redacted]d     Secondary diagnosis:  Active Problems:   Supervision of high-risk pregnancy   History of cesarean delivery   Gestational diabetes mellitus, antepartum   H/O cesarean section  Additional problems: History of gHTN, Obesity in pregnancy, Sickle Cell Trait, astham affecting pregnancy     Discharge diagnosis: Term Pregnancy Delivered, GDM A2 and History of Cesarean delivery                                                                                                Post partum procedures:none  Augmentation: none  Complications: None  Hospital course:  Induction of Labor With Cesarean Section  31 y.o. yo [id number] at [redacted]w[redacted]d was admitted to the hospital 10/17/2016 for induction of labor. Patient had a labor course significant for gHTN, gDM, and history of Cesarean delivery. The patient went for cesarean section due to Elective Repeat, and delivered a Viable infant, Membrane Rupture Time/Date: )10:27 AM ,10/17/2016    of operation can be found in separate operative Note.  Patient had an uncomplicated postpartum course. She is ambulating, tolerating a regular diet, passing flatus, and urinating well.  Patient is discharged home in stable condition on 10/20/16.                                    Physical exam  Vitals:   10/19/16 0900 10/19/16 1745 10/19/16 1933 10/20/16 0617  BP: 121/72 109/67  123/76  Pulse: 72 94  73  Resp: Temp: 98.2 F (36.8 C) 98.7 F (37.1 C) 98.3 F (36.8 C) 98.5 F (36.9 C)  TempSrc: Oral Oral Oral Oral  SpO2:  100%     General: alert, cooperative and no distress Lochia: appropriate Uterine Fundus: firm Incision: Healing well with no significant drainage, No significant erythema, Dressing is clean,  dry, and intact DVT Evaluation: No evidence of DVT seen on physical exam. No cords or calf tenderness. No significant calf/ankle edema. Labs: Lab Results  Component Value Date   WBC 9.4 10/18/2016   HGB 9.4 (L) 10/18/2016   HCT 27.3 (L) 10/18/2016   MCV 75.8 (L) 10/18/2016   PLT 208 10/18/2016   CMP Latest Ref Rng & Units 10/09/2016  Glucose 65 - 99 mg/dL 98  BUN 6 - 20 mg/dL 9  Creatinine 1.47 - 8.29 mg/dL 5.62  Sodium 130 - 865 mmol/L 135  Potassium 3.5 - 5.1 mmol/L 3.5  Chloride 101 - 111 mmol/L 106  CO2 22 - 32 mmol/L 22  Calcium 8.9 - 10.3 mg/dL 7.8(I)  Total Protein 6.5 - 8.1 g/dL 5.9(L)  Total Bilirubin 0.3 - 1.2 mg/dL 0.7  Alkaline Phos 38 - 126 U/L 106  AST 15 - 41 U/L 27  ALT 14 - 54 U/L 16    Discharge instruction: per  After Visit Summary and "Baby and Me Booklet".  After visit meds:  Allergies as of 10/20/2016      Reactions   Flagyl [metronidazole] Itching   Nsaids Swelling   Facial swelling and temporary blindness   Zofran [ondansetron Hcl] Other (See Comments)   Pt states "it made me crazy"   Levaquin [levofloxacin In D5w] Rash      Medication List    STOP taking these medications   acetaminophen 325 MG tablet Commonly known as:  TYLENOL   Butalbital-APAP-Caffeine 50-325-40 MG capsule   Prenatal Vitamins 0.8 MG tablet     TAKE these medications   ACCU-CHEK AVIVA device Use as instructed   ACCU-CHEK AVIVA PLUS test strip Generic drug:  glucose blood USE AS INSTRUCTED TO CHECK BLOOD SUGARS 4 TIMES DAILY   accu-chek soft touch lancets Use as instructed to check blood sugars 4 times daily.   albuterol 108 (90 Base) MCG/ACT inhaler Commonly known as:  PROVENTIL HFA;VENTOLIN HFA Inhale 2 puffs into the lungs every 6 (six) hours as needed for wheezing or shortness of breath.   calcium carbonate 500 MG chewable tablet Commonly known as:  TUMS - dosed in mg elemental calcium Chew 2 tablets by mouth daily as needed for indigestion or  heartburn.   montelukast 10 MG tablet Commonly known as:  SINGULAIR Take 1 tablet (10 mg total) by mouth at bedtime. What changed:  when to take this  reasons to take this   oxyCODONE-acetaminophen 5-325 MG tablet Commonly known as:  PERCOCET/ROXICET Take 1-2 tablets by mouth every 4 (four) hours as needed for moderate pain.       Diet: carb modified diet  Activity: Advance as tolerated. Pelvic rest for 6 weeks.   Outpatient follow up: 4 weeks Follow up Appt:Future Appointments Date Time Provider Department Center  11/26/2016 10:30 AM Federico Flake, MD [facility]   Postpartum contraception: Progesterone only pills and IUD undecided  Newborn Data: Live born female  Birth Weight: 7 lb 3.9 oz (3285 g) APGAR: 7, 9  Newborn Delivery   Birth date/time:  10/17/2016 10:30:00 Delivery type:  C-Section, Vacuum Assisted  C-section categorization:  Repeat     Baby Feeding: Bottle and Breast Disposition:home with mother   10/20/2016 Arlyce Harman, DO  OB FELLOW DISCHARGE ATTESTATION  I have seen and examined this patient and agree with above documentation in the resident's note.   Frederik Pear, MD OB Fellow 10:53 PM

## 2016-10-20 NOTE — Lactation Note (Signed)
This note was copied from a baby's chart. Lactation Consultation Note  Patient Name: Janet [name] ZOXWR'U Date: 10/20/2016 Reason for consult: Follow-up assessment   Baby 84 hours old.  Mother breastfed her twin sons for 2 year and started supplementing w/ formula at 34 months old. Baby latched in cradle hold upon entering.  Sleepy at the breast but latched deep. Mother denies questions or concerns.   Mom encouraged to feed baby 8-12 times/24 hours and with feeding cues.  Provided mother w/ manual pump. Reviewed engorgement care and monitoring voids/stools.    Maternal Data Has patient been taught Hand Expression?: Yes Does the patient have breastfeeding experience prior to this delivery?: Yes  Feeding Feeding Type: Breast Fed Length of feed: 10 min  LATCH Score Latch: Grasps breast easily, tongue down, lips flanged, rhythmical sucking.  Audible Swallowing: A few with stimulation  Type of Nipple: Everted at rest and after stimulation  Comfort (Breast/Nipple): Soft / non-tender  Hold (Positioning): No assistance needed to correctly position infant at breast.  LATCH Score: 9  Interventions Interventions: Breast feeding basics reviewed  Lactation Tools Discussed/Used     Consult Status Consult Status: Complete    Hardie Pulley 10/20/2016, 8:24 AM

## 2016-10-20 NOTE — Progress Notes (Signed)
CSW received consult due to score greater than 9, or positive for SI on Edinburgh Depression Screen.  MOB's EDPS was an 6.   When CSW arrived, MOB was resting in bed, infant was asleep in bassinet, and FOB was sitting on the couch.  MOB gave CSW permission to meet with MOB while FOB was present.   CSW provided education regarding Baby Blues vs PMADs and provided MOB with information about support groups held at [facility].  CSW encouraged MOB to evaluate her mental health throughout the postpartum period with the use of the New Mom Checklist developed by Postpartum Progress and notify a medical professional if symptoms arise.    MOB reports being an established patient at [facility] and plans to contact the agency if a need arise.  MOB admitted to PPD with MOB's twins 9 years ago and expressed she experienced daily crying and feelings of sadness.   CSW assessed for safety and MOB denied SI and HI.  MOB also reported the family will receive support from family friends and family members.    CSW suggested MOB to follow-up with MOB's OB in 3-4 weeks as oppose to 6 weeks due to MOB's prior PPD hx and EDPS; MOB agreed.   CSW updated bedside nurse.   There are no barriers to d/c.  Blaine Hamper, MSW, LCSW Clinical Social Work [phone]

## 2016-10-20 NOTE — Discharge Instructions (Signed)

## 2016-10-21 LAB — URINE CULTURE: Culture: <10000 — AB

## 2016-11-01 ENCOUNTER — Inpatient Hospital Stay (HOSPITAL_COMMUNITY)
Admission: AD | Admit: 2016-11-01 | Discharge: 2016-11-01 | Disposition: A | Discharge status: Home / Self Care | Payer: Medicaid Other | Source: Ambulatory Visit | Attending: Obstetrics and Gynecology | Admitting: Obstetrics and Gynecology

## 2016-11-01 DIAGNOSIS — I10 Essential (primary) hypertension: Secondary | ICD-10-CM

## 2016-11-01 DIAGNOSIS — O165 Unspecified maternal hypertension, complicating the puerperium: Secondary | ICD-10-CM | POA: Insufficient documentation

## 2016-11-01 DIAGNOSIS — T8149XA Infection following a procedure, other surgical site, initial encounter: Secondary | ICD-10-CM

## 2016-11-01 DIAGNOSIS — O9089 Other complications of the puerperium, not elsewhere classified: Secondary | ICD-10-CM | POA: Diagnosis present

## 2016-11-01 DIAGNOSIS — O8600 Infection of obstetric surgical wound, unspecified: Secondary | ICD-10-CM | POA: Diagnosis not present

## 2016-11-01 MED ORDER — AMLODIPINE BESYLATE 5 MG PO TABS: 5.0000 mg | ORAL_TABLET | Freq: Every day | ORAL | 11 refills | Status: DC

## 2016-11-01 MED ORDER — CEPHALEXIN 500 MG PO CAPS: 500.0000 mg | ORAL_CAPSULE | Freq: Four times a day (QID) | ORAL | 0 refills | Status: DC

## 2016-11-01 NOTE — MAU Note (Signed)
Pt had C-Section on 10/17/2008. Stated it has been leaking since the third day. She has been watching it and it has not gotten any better.Stated she has been having a fever on and off . Last night it was 100.5 and she took some tylenol and it went down. C/O increase pain to the left side of her incision.

## 2016-11-01 NOTE — Discharge Instructions (Signed)
Hypertension Hypertension, commonly called high blood pressure, is when the force of blood pumping through the arteries is too strong. The arteries are the blood vessels that carry blood from the heart throughout the body. Hypertension forces the heart to work harder to pump blood and may cause arteries to become narrow or stiff. Having untreated or uncontrolled hypertension can cause heart attacks, strokes, kidney disease, and other problems. A blood pressure reading consists of a higher number over a lower number. Ideally, your blood pressure should be below 120/80. The first ("top") number is called the systolic pressure. It is a measure of the pressure in your arteries as your heart beats. The second ("bottom") number is called the diastolic pressure. It is a measure of the pressure in your arteries as the heart relaxes. What are the causes? The cause of this condition is not known. What increases the risk? Some risk factors for high blood pressure are under your control. Others are not. Factors you can change  Smoking.  Having type 2 diabetes mellitus, high cholesterol, or both.  Not getting enough exercise or physical activity.  Being overweight.  Having too much fat, sugar, calories, or salt (sodium) in your diet.  Drinking too much alcohol. Factors that are difficult or impossible to change  Having chronic kidney disease.  Having a family history of high blood pressure.  Age. Risk increases with age.  Race. You may be at higher risk if you are African-American.  Gender. Men are at higher risk than women before age 59. After age 32, women are at higher risk than men.  Having obstructive sleep apnea.  Stress. What are the signs or symptoms? Extremely high blood pressure (hypertensive crisis) may cause:  Headache.  Anxiety.  Shortness of breath.  Nosebleed.  Nausea and vomiting.  Severe chest pain.  Jerky movements you cannot control (seizures).  How is this  diagnosed? This condition is diagnosed by measuring your blood pressure while you are seated, with your arm resting on a surface. The cuff of the blood pressure monitor will be placed directly against the skin of your upper arm at the level of your heart. It should be measured at least twice using the same arm. Certain conditions can cause a difference in blood pressure between your right and left arms. Certain factors can cause blood pressure readings to be lower or higher than normal (elevated) for a short period of time:  When your blood pressure is higher when you are in a health care provider's office than when you are at home, this is called white coat hypertension. Most people with this condition do not need medicines.  When your blood pressure is higher at home than when you are in a health care provider's office, this is called masked hypertension. Most people with this condition may need medicines to control blood pressure.  If you have a high blood pressure reading during one visit or you have normal blood pressure with other risk factors:  You may be asked to return on a different day to have your blood pressure checked again.  You may be asked to monitor your blood pressure at home for 1 week or longer.  If you are diagnosed with hypertension, you may have other blood or imaging tests to help your health care provider understand your overall risk for other conditions. How is this treated? This condition is treated by making healthy lifestyle changes, such as eating healthy foods, exercising more, and reducing your alcohol intake. Your  health care provider may prescribe medicine if lifestyle changes are not enough to get your blood pressure under control, and if:  Your systolic blood pressure is above 130.  Your diastolic blood pressure is above 80.  Your personal target blood pressure may vary depending on your medical conditions, your age, and other factors. Follow these  instructions at home: Eating and drinking  Eat a diet that is high in fiber and potassium, and low in sodium, added sugar, and fat. An example eating plan is called the DASH (Dietary Approaches to Stop Hypertension) diet. To eat this way: ? Eat plenty of fresh fruits and vegetables. Try to fill half of your plate at each meal with fruits and vegetables. ? Eat whole grains, such as whole wheat pasta, brown rice, or whole grain bread. Fill about one quarter of your plate with whole grains. ? Eat or drink low-fat dairy products, such as skim milk or low-fat yogurt. ? Avoid fatty cuts of meat, processed or cured meats, and poultry with skin. Fill about one quarter of your plate with lean proteins, such as fish, chicken without skin, beans, eggs, and tofu. ? Avoid premade and processed foods. These tend to be higher in sodium, added sugar, and fat.  Reduce your daily sodium intake. Most people with hypertension should eat less than 1,500 mg of sodium a day.  Limit alcohol intake to no more than 1 drink a day for nonpregnant women and 2 drinks a day for men. One drink equals 12 oz of beer, 5 oz of wine, or 1 oz of hard liquor. Lifestyle  Work with your health care provider to maintain a healthy body weight or to lose weight. Ask what an ideal weight is for you.  Get at least 30 minutes of exercise that causes your heart to beat faster (aerobic exercise) most days of the week. Activities may include walking, swimming, or biking.  Include exercise to strengthen your muscles (resistance exercise), such as pilates or lifting weights, as part of your weekly exercise routine. Try to do these types of exercises for 30 minutes at least 3 days a week.  Do not use any products that contain nicotine or tobacco, such as cigarettes and e-cigarettes. If you need help quitting, ask your health care provider.  Monitor your blood pressure at home as told by your health care provider.  Keep all follow-up visits as  told by your health care provider. This is important. Medicines  Take over-the-counter and prescription medicines only as told by your health care provider. Follow directions carefully. Blood pressure medicines must be taken as prescribed.  Do not skip doses of blood pressure medicine. Doing this puts you at risk for problems and can make the medicine less effective.  Ask your health care provider about side effects or reactions to medicines that you should watch for. Contact a health care provider if:  You think you are having a reaction to a medicine you are taking.  You have headaches that keep coming back (recurring).  You feel dizzy.  You have swelling in your ankles.  You have trouble with your vision. Get help right away if:  You develop a severe headache or confusion.  You have unusual weakness or numbness.  You feel faint.  You have severe pain in your chest or abdomen.  You vomit repeatedly.  You have trouble breathing. Summary  Hypertension is when the force of blood pumping through your arteries is too strong. If this condition is not  controlled, it may put you at risk for serious complications.  Your personal target blood pressure may vary depending on your medical conditions, your age, and other factors. For most people, a normal blood pressure is less than 120/80.  Hypertension is treated with lifestyle changes, medicines, or a combination of both. Lifestyle changes include weight loss, eating a healthy, low-sodium diet, exercising more, and limiting alcohol. This information is not intended to replace advice given to you by your health care provider. Make sure you discuss any questions you have with your health care provider. Document Released: 12/30/2004 Document Revised: 11/28/2015 Document Reviewed: 11/28/2015 Elsevier Interactive Patient Education  2018 [employer] Mechanical Wound Debridement Mechanical wound debridement is a treatment to remove dead  tissue from a wound. This helps the wound heal. The treatment involves cleaning the wound (irrigation) and using a pad or gauze (dressing) to remove dead tissue and debris from the wound. There are different types of mechanical wound debridement. Depending on the wound, you may need to repeat this procedure or change to another form of debridement as your wound starts to heal. Tell a health care provider about:  Any allergies you have.  All medicines you are taking, including vitamins, herbs, eye drops, creams, and over-the-counter medicines.  Any blood disorders you have.  Any medical conditions you have, including any conditions that: ? Cause a significant decrease in blood circulation to the part of the body where the wound is, such as peripheral vascular disease. ? Compromise your defense (immune) system or white blood count.  Any surgeries you have had.  Whether you are pregnant or may be pregnant. What are the risks? Generally, this is a safe procedure. However, problems may occur, including:  Infection.  Bleeding.  Damage to healthy tissue in and around your wound.  Soreness or pain.  Failure of the wound to heal.  Scarring.  What happens before the procedure? You may be given antibiotic medicine to help prevent infection. What happens during the procedure?  Your health care provider may apply a numbing medicine (topical anesthetic) to the wound.  Your health care provider will irrigate your wound with a germ-free (sterile), salt-water (saline) solution. This removes debris, bacteria, and dead tissue.  Depending on what type of mechanical wound debridement you are having, your health care provider may do one of the following: ? Put a dressing on your wound. You may have dry gauze pad placed into the wound. Your health care provider will remove the gauze after the wound is dry. Any dead tissue and debris that has dried into the gauze will be lifted out of the wound  (wet-to-dry debridement). ? Use a type of pad (monofilament fiber debridement pad). This pad has a fluffy surface on one side that picks up dead tissue and debris from your wound. Your health care provider wets the pad and wipes it over your wound for several minutes. ? Irrigate your wound with a pressurized stream of solution such as saline or water.  Once your health care provider is finished, he or she may apply a light dressing to your wound. The procedure may vary among health care providers and hospitals. What happens after the procedure?  You may receive medicine for pain.  You will continue to receive antibiotic medicine if it was started before your procedure. This information is not intended to replace advice given to you by your health care provider. Make sure you discuss any questions you have with your health care provider. Document  Released: 09/20/2014 Document Revised: 06/07/2015 Document Reviewed: 05/10/2014 Elsevier Interactive Patient Education  [employer].

## 2016-11-01 NOTE — MAU Provider Note (Signed)
History   S/P c/s of 10/21/16 in with c/o wound oozing and low grade temp. States is on med for her BP that she takes when it is up. Does not know name of med.  CSN: [id number]  Arrival date & time 11/01/16  1458   None     Chief Complaint  Patient presents with  . Wound Check    HPI  Past Medical History:  Diagnosis Date  . Anemia   . Asthma   . Depression   . Gestational diabetes   . Headache    migraines    Past Surgical History:  Procedure Laterality Date  . CESAREAN SECTION    . CESAREAN SECTION N/A 10/17/2016   Procedure: REPEAT CESAREAN SECTION;  Surgeon: Adam PhenixArnold, James G, MD;  [facility];  Service: Obstetrics;  Laterality: N/A;  . DILATION AND CURETTAGE OF UTERUS      Family History  Problem Relation Age of Onset  . Hypertension Mother   . Heart disease Mother   . Diabetes Father   . Cancer Maternal Aunt        Breast    Social History  Substance Use Topics  . Smoking status: Never Smoker  . Smokeless tobacco: Never Used  . Alcohol use No    OB History    Gravida Para Term Preterm AB Living   3 2 2   1 2    SAB TAB Ectopic Multiple Live Births   1     1 2       Review of Systems  Constitutional: Positive for fever.  HENT: Negative.   Eyes: Negative.   Respiratory: Negative.   Cardiovascular: Negative.   Gastrointestinal: Negative.   Endocrine: Negative.   Genitourinary: Negative.   Musculoskeletal: Negative.   Skin: Positive for wound.  Allergic/Immunologic: Negative.   Neurological: Negative.   Hematological: Negative.   Psychiatric/Behavioral: Negative.     Allergies  Flagyl [metronidazole]; Nsaids; Zofran [ondansetron hcl]; and Levaquin [levofloxacin in d5w]  Home Medications    BP (!) 136/92   Pulse 87   Temp 99 F (37.2 C)   Resp 18   Physical Exam  Constitutional: She is oriented to person, place, and time. She appears well-developed and well-nourished.  HENT:  Head: Normocephalic.  Eyes: Pupils are  equal, round, and reactive to light.  Neck: Normal range of motion.  Cardiovascular: Normal rate, regular rhythm, normal heart sounds and intact distal pulses.   Pulmonary/Chest: Effort normal and breath sounds normal.  Abdominal: Soft. Bowel sounds are normal.  Neurological: She is alert and oriented to person, place, and time. She has normal reflexes.  Skin: Skin is warm and dry.  1cm open area left side of incision draining mucopurulent drainage  Psychiatric: She has a normal mood and affect. Her behavior is normal. Judgment and thought content normal.    MAU Course  Procedures (including critical care time)  Labs Reviewed - No data to display No results found.   1. Postoperative wound infection    2,  hypertension   MDM  Vss, 1cm open area left side of incision draining mucopurulent drainage. Wound cleaned  With peroxide and 4x4 covering open area. Discussed wound care with pt. Will start on keflex and norvasc 5mg  and have pt f/u in clinic on Friday.

## 2016-11-01 NOTE — MAU Note (Signed)
Cleansed with H2O2 and place dry 4x4 over small open area on the left edge of incision.

## 2016-11-21 ENCOUNTER — Ambulatory Visit (INDEPENDENT_AMBULATORY_CARE_PROVIDER_SITE_OTHER): Payer: Medicaid Other | Admitting: Obstetrics and Gynecology

## 2016-11-21 VITALS — BP 128/86 | HR 72 | Wt 225.0 lb

## 2016-11-21 DIAGNOSIS — Z09 Encounter for follow-up examination after completed treatment for conditions other than malignant neoplasm: Secondary | ICD-10-CM

## 2016-11-21 NOTE — Progress Notes (Signed)
[facility] 11/21/2016  CC: wound check, BP check  Subjective:   S/p 10/5 rLTCS and had 10/20 MAU visit for wound disruption. 1cm open area on left side of incision and some drainage noted. She was put on keflex  Pt finished abx w/o issue and is doing well  Objective:    BP 128/86   Pulse 72   Wt 225 lb (102.1 kg)   BMI 33.23 kg/m   General:  alert  Abdomen: soft, bowel sounds active, non-tender  Incision:   low transverse skin incision well healed and no e/o infection or opening     Assessment:    Doing well postoperatively.    Plan:   Routine care. Has regular PP visit next week. Pt told to stay on norvasc for now and come in fasting  Cornelia Copaharlie Janvi Ammar, Jr MD Attending [facility](Faculty Practice)

## 2016-11-26 ENCOUNTER — Ambulatory Visit (INDEPENDENT_AMBULATORY_CARE_PROVIDER_SITE_OTHER): Payer: Medicaid Other | Admitting: Family Medicine

## 2016-11-26 ENCOUNTER — Encounter: Payer: Self-pay | Admitting: Family Medicine

## 2016-11-26 NOTE — Progress Notes (Signed)
Post Partum Exam  Janet [name] is a 31 y.o. [id number] female who presents for a postpartum visit. She is 4 weeks postpartum following a low cervical transverse Cesarean section. I have fully reviewed the prenatal and intrapartum course. The delivery was at 39 gestational weeks.  Anesthesia: epidural. Postpartum course has been unremarkable. Baby's course has been unremarkable. Baby is feeding by breast. Bleeding no bleeding. Bowel function is normal. Bladder function is normal. Patient is not sexually active. Contraception method is IUD. Postpartum depression screening:neg  The following portions of the patient's history were reviewed and updated as appropriate: allergies, current medications, past family history, past medical history, past social history, past surgical history and problem list.  Review of Systems Pertinent items are noted in HPI.    Objective:  Blood pressure 127/87, weight 223 lb (101.2 kg), currently breastfeeding.  General:  alert, cooperative and appears stated age   Breasts:  Not examined, denies any breast or breastfeeding issues today  Lungs: clear to auscultation bilaterally  Heart:  regular rate and rhythm, S1, S2 normal, no murmur, click, rub or gallop  Abdomen: soft, non-tender; bowel sounds normal; no masses,  no organomegaly   Vulva:  not evaluated  Vagina: not evaluated  Cervix:  Not evelauted  Corpus: not examined  Adnexa:  not evaluated  Rectal Exam: Not performed.        Assessment:    Normal postpartum exam. Needs pap at IUD insertion  Plan:   1. Contraception: Desires IUD but states the she had an extensive conversation with Dr. Macon LargeAnyanwu about but does not remember the type she wants. I have given her information about the cu-IUD and the LNG IUD as well as efficacy rates for all methods to hep with decision making 2. Breastfeeding- going well. Infant is gaining normally. Exclusively breastfeeding. Reviewed mastitis s/sx.  3. Mood: stable. No  concerns. Is tired and overwhlemed but this seems within normal. Reivewed resources.   Follow up in: 2 weeks

## 2016-12-03 ENCOUNTER — Other Ambulatory Visit: Payer: Medicaid Other

## 2016-12-03 ENCOUNTER — Ambulatory Visit: Payer: Medicaid Other | Admitting: Family Medicine

## 2016-12-03 DIAGNOSIS — O24419 Gestational diabetes mellitus in pregnancy, unspecified control: Secondary | ICD-10-CM

## 2016-12-04 LAB — GLUCOSE TOLERANCE, 2 HOURS
GLUCOSE FASTING GTT: 86 mg/dL (ref 65–99)
Glucose, 2 hour: 78 mg/dL (ref 65–139)

## 2016-12-05 ENCOUNTER — Encounter: Payer: Self-pay | Admitting: Emergency Medicine

## 2016-12-05 ENCOUNTER — Emergency Department
Admission: EM | Admit: 2016-12-05 | Discharge: 2016-12-05 | Disposition: A | Discharge status: Home / Self Care | Payer: Medicaid Other | Attending: Emergency Medicine | Admitting: Emergency Medicine

## 2016-12-05 ENCOUNTER — Other Ambulatory Visit: Payer: Self-pay

## 2016-12-05 DIAGNOSIS — R509 Fever, unspecified: Secondary | ICD-10-CM | POA: Diagnosis not present

## 2016-12-05 DIAGNOSIS — Z79899 Other long term (current) drug therapy: Secondary | ICD-10-CM | POA: Insufficient documentation

## 2016-12-05 DIAGNOSIS — R2232 Localized swelling, mass and lump, left upper limb: Secondary | ICD-10-CM | POA: Diagnosis present

## 2016-12-05 DIAGNOSIS — J45909 Unspecified asthma, uncomplicated: Secondary | ICD-10-CM | POA: Diagnosis not present

## 2016-12-05 DIAGNOSIS — L02412 Cutaneous abscess of left axilla: Secondary | ICD-10-CM | POA: Diagnosis not present

## 2016-12-05 LAB — CBC WITH DIFFERENTIAL/PLATELET
BASOS ABS: 0.1 10*3/uL (ref 0–0.1)
BASOS PCT: 1 %
Eosinophils Absolute: 0.1 10*3/uL (ref 0–0.7)
Eosinophils Relative: 2 %
HEMATOCRIT: 37.2 % (ref 35.0–47.0)
HEMOGLOBIN: 12.4 g/dL (ref 12.0–16.0)
LYMPHS PCT: 32 %
Lymphs Abs: 2.2 10*3/uL (ref 1.0–3.6)
MCH: 24.4 pg — ABNORMAL LOW (ref 26.0–34.0)
MCHC: 33.2 g/dL (ref 32.0–36.0)
MCV: 73.4 fL — AB (ref 80.0–100.0)
MONO ABS: 0.4 10*3/uL (ref 0.2–0.9)
MONOS PCT: 6 %
NEUTROS ABS: 4.1 10*3/uL (ref 1.4–6.5)
NEUTROS PCT: 59 %
Platelets: 248 10*3/uL (ref 150–440)
RBC: 5.07 MIL/uL (ref 3.80–5.20)
RDW: 17.1 % — ABNORMAL HIGH (ref 11.5–14.5)
WBC: 6.9 10*3/uL (ref 3.6–11.0)

## 2016-12-05 LAB — COMPREHENSIVE METABOLIC PANEL
ALBUMIN: 3.9 g/dL (ref 3.5–5.0)
ALT: 24 U/L (ref 14–54)
AST: 32 U/L (ref 15–41)
Alkaline Phosphatase: 87 U/L (ref 38–126)
Anion gap: 10 (ref 5–15)
BUN: 16 mg/dL (ref 6–20)
CALCIUM: 9.2 mg/dL (ref 8.9–10.3)
CO2: 23 mmol/L (ref 22–32)
Chloride: 103 mmol/L (ref 101–111)
Creatinine, Ser: 0.87 mg/dL (ref 0.44–1.00)
GLUCOSE: 95 mg/dL (ref 65–99)
POTASSIUM: 4.1 mmol/L (ref 3.5–5.1)
SODIUM: 136 mmol/L (ref 135–145)
Total Bilirubin: 0.9 mg/dL (ref 0.3–1.2)
Total Protein: 7.7 g/dL (ref 6.5–8.1)

## 2016-12-05 MED ORDER — LIDOCAINE HCL (PF) 1 % IJ SOLN
INTRAMUSCULAR | Status: AC
  Administered 2016-12-05: 5 mL via INTRADERMAL
  Filled 2016-12-05: qty 5

## 2016-12-05 MED ORDER — LIDOCAINE HCL (PF) 1 % IJ SOLN
5.0000 mL | Freq: Once | INTRAMUSCULAR | Status: AC
  Administered 2016-12-05: 5 mL via INTRADERMAL

## 2016-12-05 MED ORDER — CLINDAMYCIN HCL 150 MG PO CAPS
ORAL_CAPSULE | ORAL | Status: AC
  Administered 2016-12-05: 300 mg via ORAL
  Filled 2016-12-05: qty 2

## 2016-12-05 MED ORDER — CLINDAMYCIN HCL 300 MG PO CAPS: 300.0000 mg | ORAL_CAPSULE | Freq: Three times a day (TID) | ORAL | 0 refills | Status: DC

## 2016-12-05 MED ORDER — OXYCODONE-ACETAMINOPHEN 5-325 MG PO TABS
1.0000 | ORAL_TABLET | Freq: Once | ORAL | Status: AC
  Administered 2016-12-05: 1 via ORAL
  Filled 2016-12-05: qty 1

## 2016-12-05 MED ORDER — OXYCODONE-ACETAMINOPHEN 5-325 MG PO TABS: 1.0000 | ORAL_TABLET | Freq: Four times a day (QID) | ORAL | 0 refills | Status: AC | PRN

## 2016-12-05 MED ORDER — CLINDAMYCIN HCL 150 MG PO CAPS
300.0000 mg | ORAL_CAPSULE | Freq: Once | ORAL | Status: AC
  Administered 2016-12-05: 300 mg via ORAL

## 2016-12-05 NOTE — ED Notes (Signed)
MD at bedside. 

## 2016-12-05 NOTE — ED Notes (Signed)
Says recently finished an antibiotic-keflex-for wound opening.  Says that is better.

## 2016-12-05 NOTE — ED Provider Notes (Signed)
[facility] Emergency Department Provider Note ____________________________________________   First MD Initiated Contact with Patient 12/05/16 1513     (approximate)  I have reviewed the triage vital signs and the nursing notes.   HISTORY  Chief Complaint Arm Swelling and Fever    HPI Janet [name] is a 31 y.o. female with past medical history as noted below and who is status post giving birth to a healthy female 7 weeks ago by C-section who presents with bilateral axillary swelling and pain over the last week, gradual onset, worse on the left, and associated with subjective fever and chills.  Patient states that she was just on a course of Keflex for an incisional infection but the symptoms occur despite this.  She has no history of abscess or MRSA in the past.  Patient denies any breast pain or swelling, and states that breast-feeding has been normal.  Past Medical History:  Diagnosis Date  . Anemia   . Asthma   . Depression   . Gestational diabetes   . Headache    migraines    Patient Active Problem List   Diagnosis Date Noted  . Gestational diabetes mellitus, antepartum 08/12/2016  . Asthma 07/24/2016  . Obesity, Class II, BMI 35-39.9 06/27/2016  . Migraine 06/12/2016  . Sickle cell trait (HCC) 05/15/2016  . Supervision of high-risk pregnancy 04/24/2016  . History of gestational hypertension 04/24/2016  . History of cesarean delivery 04/24/2016    Past Surgical History:  Procedure Laterality Date  . CESAREAN SECTION    . CESAREAN SECTION N/A 10/17/2016   Procedure: REPEAT CESAREAN SECTION;  Surgeon: Adam PhenixArnold, James G, MD;  [facility];  Service: Obstetrics;  Laterality: N/A;  . DILATION AND CURETTAGE OF UTERUS      Prior to Admission medications   Medication Sig Start Date End Date Taking? Authorizing Provider  albuterol (PROVENTIL HFA;VENTOLIN HFA) 108 (90 Base) MCG/ACT inhaler Inhale 2 puffs into the lungs every 6  (six) hours as needed for wheezing or shortness of breath. 07/24/16   Anyanwu, Jethro BastosUgonna A, MD  amLODipine (NORVASC) 5 MG tablet Take 1 tablet (5 mg total) by mouth daily. 11/01/16   Montez MoritaLawson, Marie D, CNM  calcium carbonate (TUMS - DOSED IN MG ELEMENTAL CALCIUM) 500 MG chewable tablet Chew 2 tablets by mouth daily as needed for indigestion or heartburn.    [provider]  clindamycin (CLEOCIN) 300 MG capsule Take 1 capsule (300 mg total) by mouth 3 (three) times daily. 12/05/16   Dionne BucySiadecki, Hillary Struss, MD  famotidine (PEPCID) 40 MG/5ML suspension TAKE 2.5 MLS (20 MG TOTAL) BY MOUTH 2 (TWO) TIMES DAILY. 10/07/16   [provider]  montelukast (SINGULAIR) 10 MG tablet Take 1 tablet (10 mg total) by mouth at bedtime. 07/24/16   Anyanwu, Jethro BastosUgonna A, MD  oxyCODONE-acetaminophen (ROXICET) 5-325 MG tablet Take 1 tablet by mouth every 6 (six) hours as needed for up to 7 days. 12/05/16 12/12/16  Dionne BucySiadecki, Davey Bergsma, MD    Allergies Flagyl [metronidazole]; Nsaids; Zofran [ondansetron hcl]; and Levaquin [levofloxacin in d5w]  Family History  Problem Relation Age of Onset  . Hypertension Mother   . Heart disease Mother   . Diabetes Father   . Cancer Maternal Aunt        Breast    Social History Social History   Tobacco Use  . Smoking status: Never Smoker  . Smokeless tobacco: Never Used  Substance Use Topics  . Alcohol use: No  . Drug use: No  Review of Systems  Constitutional: Positive for subjective fever and chills. Eyes: No redness. ENT: No neck pain. Cardiovascular: Denies chest or breast pain. Respiratory: Denies shortness of breath. Gastrointestinal: No nausea, no vomiting.   Genitourinary: Negative for dysuria.  Musculoskeletal: Negative for back pain. Skin: Negative for rash. Neurological: Negative for headache.   ____________________________________________   PHYSICAL EXAM:  VITAL SIGNS: ED Triage Vitals  Enc Vitals Group     BP 12/05/16 1224 131/87      Pulse Rate 12/05/16 1224 84     Resp 12/05/16 1224 14     Temp 12/05/16 1224 98.7 F (37.1 C)     Temp Source 12/05/16 1224 Oral     SpO2 12/05/16 1224 99 %     Weight 12/05/16 1224 223 lb (101.2 kg)     Height 12/05/16 1224 5\' 9"  (1.753 m)     Head Circumference --      Peak Flow --      Pain Score 12/05/16 1223 10     Pain Loc --      Pain Edu? --      Excl. in GC? --     Constitutional: Alert and oriented. Well appearing and in no acute distress. Eyes: Conjunctivae are normal.  Head: Atraumatic. Nose: No congestion/rhinnorhea. Mouth/Throat: Mucous membranes are moist.   Neck: Normal range of motion.  Cardiovascular:   Good peripheral circulation. Respiratory: Normal respiratory effort.  No retractions.  Gastrointestinal: No distention.  Musculoskeletal:  Extremities warm and well perfused.  Neurologic:  Normal speech and language. No gross focal neurologic deficits are appreciated.  Skin:  Skin is warm and dry. No rash noted.  Left axilla with approximately 5 cm fluctuant and tender mass with minimal surrounding induration, and no erythema or warmth.  Right axilla with approximately 3 cm area of mild induration with no significant fluctuance, warmth, or redness. Psychiatric: Mood and affect are normal. Speech and behavior are normal.  ____________________________________________   LABS (all labs ordered are listed, but only abnormal results are displayed)  Labs Reviewed  CBC WITH DIFFERENTIAL/PLATELET - Abnormal; Notable for the following components:      Result Value   MCV 73.4 (*)    MCH 24.4 (*)    RDW 17.1 (*)    All other components within normal limits  COMPREHENSIVE METABOLIC PANEL   ____________________________________________  EKG   ____________________________________________  RADIOLOGY    ____________________________________________   PROCEDURES  Procedure(s) performed: Yes  INCISION AND DRAINAGE Performed by: Dionne Bucy Consent:  Verbal consent obtained. Risks and benefits: risks, benefits and alternatives were discussed Type: abscess  Body area: Left axilla  Anesthesia: local infiltration  Incision was made with a scalpel.  Local anesthetic: lidocaine 1% w/o epinephrine  Anesthetic total: 4 ml  Complexity: complex Blunt dissection to break up loculations  Drainage: purulent  Drainage amount: 4-64mL  Packing material: None  Patient tolerance: Patient tolerated the procedure well with no immediate complications.      Critical Care performed: No ____________________________________________   INITIAL IMPRESSION / ASSESSMENT AND PLAN / ED COURSE  Pertinent labs & imaging results that were available during my care of the patient were reviewed by me and considered in my medical decision making (see chart for details).  31 year old female with past medical history as noted above presents with bilateral axillary swelling and subjective fever and chills over the last week.  Patient is currently breast-feeding, but she denies any breast pain or swelling or any rashes to the breast  area.  She states her breast-feeding is proceeding normally.  She has no history of abscesses or MRSA in the past.  Review of past medical records in Epic is otherwise noncontributory.  On exam, patient has a fluctuant mass to the left axilla and somewhat indurated area to the right axilla with no focal area of fluctuance.  Her vital signs are normal, she is afebrile, she is very well-appearing.  I performed a bedside ultrasound and confirmed the presence of a small relatively superficial fluid collection in the left axilla in the area of fluctuance.  Therefore this is consistent with superficial abscess, rather than lymphadenopathy or any mastitis or breast-related problem.     I performed an I&D and successfully drained approximately 4 cc of purulent fluid.  Due to patient's systemic symptoms and the induration bilaterally, I  will discharge with an antibiotic prescription.  Patient's labs are reassuring, and there is no evidence of systemic infection or sepsis.  Patient was recently on a course of Keflex and the symptoms progressed during this period, I will prescribe clindamycin for broader coverage.  There is a lactation warning on clindamycin, but the primary concern is potential for diarrhea in infant, but no other significant side effects.  Plan of care and return precautions explained to patient, and she expressed understanding.       ____________________________________________   FINAL CLINICAL IMPRESSION(S) / ED DIAGNOSES  Final diagnoses:  Abscess of left axilla      NEW MEDICATIONS STARTED DURING THIS VISIT:  This SmartLink is deprecated. Use AVSMEDLIST instead to display the medication list for a patient.   Note:  This document was prepared using Dragon voice recognition software and may include unintentional dictation errors.    Dionne BucySiadecki, Esmond Hinch, MD 12/05/16 1654

## 2016-12-05 NOTE — Discharge Instructions (Signed)
Take the antibiotic as prescribed and finish the full course.  You should apply warm compresses to the armpit area, and keep the area clean and covered with a dressing.  Return to the emergency department immediately for new or worsening swelling, pain, fevers or chills, weakness, bleeding, or any other new or worsening symptoms that concern you.

## 2016-12-05 NOTE — ED Triage Notes (Signed)
Post partum 7 weeks, breast feeding.  Has had swelling under both arms x 2 weeks. The right has gotten better, but left is swollen and tender with lumps.  She has been having fever and has been taking tylenol.  Last tylenol this am.

## 2016-12-23 ENCOUNTER — Ambulatory Visit: Payer: Medicaid Other | Admitting: Family Medicine

## 2016-12-24 ENCOUNTER — Ambulatory Visit (INDEPENDENT_AMBULATORY_CARE_PROVIDER_SITE_OTHER): Payer: Medicaid Other | Admitting: Family Medicine

## 2016-12-24 ENCOUNTER — Encounter: Payer: Self-pay | Admitting: Family Medicine

## 2016-12-24 VITALS — BP 130/86 | HR 76 | Wt 228.0 lb

## 2016-12-24 DIAGNOSIS — Z01812 Encounter for preprocedural laboratory examination: Secondary | ICD-10-CM

## 2016-12-24 DIAGNOSIS — Z3009 Encounter for other general counseling and advice on contraception: Secondary | ICD-10-CM

## 2016-12-24 DIAGNOSIS — Z3202 Encounter for pregnancy test, result negative: Secondary | ICD-10-CM

## 2016-12-24 LAB — POCT URINE PREGNANCY: Preg Test, Ur: NEGATIVE

## 2016-12-24 NOTE — Progress Notes (Signed)
Last intercourse 1 week ago - use withdrawal method for contraception.

## 2016-12-24 NOTE — Progress Notes (Signed)
S: here for IUD placement  O: BP 130/86   Pulse 76   Wt 228 lb (103.4 kg)   LMP 12/24/2016   BMI 33.67 kg/m    A/P:  Difficult to place speculum, cervix is very anterior. Attempted IUD placement but patient did not tolerate sounding/tenaculum placement and asked provider to stop. Counseled extensively before and after regarding IUD insertion related pain. Patient was givne brochure about Nexplanon. Additionally discussed possible paracervical block. Patient will discuss with husband.   Needs to sign ROI for pap results from [facility].    <50% of this 15 minutes appt was spent counseling regarding contraceptive methods after patient did not tolerate IUD placement.   Federico FlakeKimberly Niles Deke Tilghman, MD, MPH, ABFM Attending Physician Faculty Practice- Center for [facility]

## 2016-12-30 ENCOUNTER — Ambulatory Visit: Payer: Medicaid Other | Admitting: Obstetrics and Gynecology

## 2017-01-07 ENCOUNTER — Ambulatory Visit (INDEPENDENT_AMBULATORY_CARE_PROVIDER_SITE_OTHER): Payer: Medicaid Other | Admitting: Obstetrics and Gynecology

## 2017-01-07 ENCOUNTER — Encounter: Payer: Self-pay | Admitting: Obstetrics and Gynecology

## 2017-01-07 VITALS — BP 123/83 | HR 90 | Wt 230.0 lb

## 2017-01-07 DIAGNOSIS — Z3049 Encounter for surveillance of other contraceptives: Secondary | ICD-10-CM

## 2017-01-07 DIAGNOSIS — Z30017 Encounter for initial prescription of implantable subdermal contraceptive: Secondary | ICD-10-CM

## 2017-01-07 DIAGNOSIS — Z3202 Encounter for pregnancy test, result negative: Secondary | ICD-10-CM

## 2017-01-07 LAB — POCT URINE PREGNANCY: Preg Test, Ur: NEGATIVE

## 2017-01-07 MED ORDER — ETONOGESTREL 68 MG ~~LOC~~ IMPL
68.0000 mg | DRUG_IMPLANT | Freq: Once | SUBCUTANEOUS | Status: AC
  Administered 2017-01-07: 68 mg via SUBCUTANEOUS

## 2017-01-07 NOTE — Procedures (Signed)
Nexplanon Insertion Procedure Note Prior to the procedure being performed, the patient (or guardian) was asked to state their full name, date of birth, type of procedure being performed and the exact location of the operative site. This information was then checked against the documentation in the patient's chart. Prior to the procedure being performed, a "time out" was performed by the physician that confirmed the correct patient, procedure and site.  After informed consent and a negative urine pregnancy test were obtained, the patient's non-dominant left arm was chosen for insertion. A site was marked approximately 8 cm proximal to the medial epicondyle in the sulcus between the biceps and triceps on the inner surface. The area was cleaned with alcohol then local anesthesia was infiltrated with 3 ml of 1% lidocaine along the planned insertion track. The area was prepped with betadine. Using sterile technique the Nexplanon device was inserted per manufacturer's guidelines in the subdermal connective tissue using the standard insertion technique without difficulty. Pressure was applied and the insertion site was hemostatic. The presence of the Nexplanon was confirmed immediately after insertion by palpation by both me and the patient and by checking the tip of needle for the absence of the insert.  A pressure dressing was applied.  The patient tolerated the procedure well.  Patient told to consider effective in one week.   Janet [name], Jr MD Attending Center for [facility] Midwife(Faculty Practice)

## 2017-01-15 ENCOUNTER — Telehealth: Payer: Self-pay

## 2017-01-15 NOTE — Telephone Encounter (Signed)
Received call from patient patient stated she does not feel good and is having diarrhea,vomiting and dizziness for a week. She thinks it is related to her nexplanon. I have advised patient she needs to be treated by a provider. I have advised her to go to urgent care or ER if she  does not have a primary care at this time. Patient voice understanding.

## 2017-01-27 ENCOUNTER — Encounter: Payer: Self-pay | Admitting: Radiology

## 2017-02-11 ENCOUNTER — Encounter: Payer: Self-pay | Admitting: Radiology

## 2017-02-13 ENCOUNTER — Other Ambulatory Visit: Payer: Self-pay

## 2017-02-13 ENCOUNTER — Encounter: Payer: Self-pay | Admitting: Family Medicine

## 2017-02-13 ENCOUNTER — Ambulatory Visit: Payer: Medicaid Other | Admitting: Family Medicine

## 2017-02-13 VITALS — BP 110/70 | HR 95 | Temp 98.4°F | Wt 233.0 lb

## 2017-02-13 DIAGNOSIS — H8110 Benign paroxysmal vertigo, unspecified ear: Secondary | ICD-10-CM | POA: Diagnosis not present

## 2017-02-13 DIAGNOSIS — Z8759 Personal history of other complications of pregnancy, childbirth and the puerperium: Secondary | ICD-10-CM | POA: Diagnosis not present

## 2017-02-13 DIAGNOSIS — R0789 Other chest pain: Secondary | ICD-10-CM | POA: Diagnosis not present

## 2017-02-13 DIAGNOSIS — G43809 Other migraine, not intractable, without status migrainosus: Secondary | ICD-10-CM | POA: Diagnosis not present

## 2017-02-13 DIAGNOSIS — R7309 Other abnormal glucose: Secondary | ICD-10-CM

## 2017-02-13 DIAGNOSIS — Z0000 Encounter for general adult medical examination without abnormal findings: Secondary | ICD-10-CM | POA: Diagnosis not present

## 2017-02-13 LAB — GLUCOSE, POCT (MANUAL RESULT ENTRY): POC Glucose: 131 mg/dl — AB (ref 70–99)

## 2017-02-13 MED ORDER — MECLIZINE HCL 32 MG PO TABS: 32.0000 mg | ORAL_TABLET | Freq: Three times a day (TID) | ORAL | 0 refills | Status: DC | PRN

## 2017-02-13 MED ORDER — VERAPAMIL HCL 40 MG PO TABS: 40.0000 mg | ORAL_TABLET | Freq: Three times a day (TID) | ORAL | 0 refills | Status: DC

## 2017-02-13 MED ORDER — BUTALBITAL-APAP-CAFFEINE 50-325-40 MG PO TABS: 1.0000 | ORAL_TABLET | Freq: Four times a day (QID) | ORAL | 0 refills | Status: AC | PRN

## 2017-02-13 NOTE — Progress Notes (Signed)
Subjective:    Patient ID: Janet [name], female    DOB: 07/30/1985, 32 y.o.   MRN: [id number]   CC: severe dizziness, sweating, vomiting  HPI:  Dizziness, sweating and vomiting: - 2 weeks after having her nexplanon inserted, she began experiencing dizziness and sweating - She reports that the room feels as if it is spinning, then she will get sweaty, nauseas and half of the time she will vomit - Only occurs when she is laying down- only trigger she is aware of - Happened last night and this morning, she threw up last night - Episode lasts 30-45 minutes - She can be feeling totally fine before and then it will just hit her and she cannot do anything to prevent it   Asthma: - Patient has albuterol inhaler for as needed - Also uses Singulair when needed - Patient   Gestational Hypertension: - Patient takes amlodipine daily still - Checks her pressure at home and it will be 180-200 systolic - Denies change in vision  Migraines: - Has had migraines for over 20 years. - Uses Fioricet as needed, lasts her a long time as she limits this to when it is bad, last used 2 weeks ago - Sensitive to light and sound - Get nauseas with her headaches - Very limiting to her daily activities - Worse with no sleep, and she has a neonate -Father and sister had terrible migraines as well  Health Maintenance Contraception: Nexplanon Pap smears: Due and will schedule at next appointment Regular eye checkups: Referral for ophthalmology today  Chest Pain: - Patient reporting that she sometimes has chest pain - No sweating, pain does not radiate anywhere - No cardiac history, and no family hx of sudden cardiac arrest - Patient reports some positions relive the pain, such as when she sits up vs slouches, worse when she is breast feeding  Review of Systems  Review of Systems  Constitutional: Positive for chills and fever.  HENT: Positive for tinnitus.   Eyes: Negative for blurred vision and  double vision.  Respiratory: Positive for sputum production. Negative for cough, shortness of breath and wheezing.   Cardiovascular: Positive for palpitations. Negative for chest pain and leg swelling.       Palpitations with dizzy spells  Gastrointestinal: Positive for vomiting. Negative for constipation and diarrhea.  Genitourinary: Negative for dysuria.  Skin: Negative for rash.  Neurological: Positive for dizziness, tremors, weakness and headaches. Negative for tingling.  Psychiatric/Behavioral: Negative for suicidal ideas. The patient is not nervous/anxious.     Family History  Problem Relation Age of Onset  . Hypertension Mother   . Heart disease Mother   . Diabetes Father   . Cancer Maternal Aunt        Breast   Social Hx:  Patient lives with husband and 3 children, a 224 month old and 2 twin boys who are 10. She has a teenage girl step-daughter. Her husband is a Engineer, civil (consulting)nurse who works long hours. She is not working currently to care for her new baby.   Past Medical History:  Diagnosis Date  . Anemia   . Asthma   . Depression   . Gestational diabetes   . Gestational diabetes mellitus, antepartum 08/12/2016   Moved pt to BRX GDM program Growth US at 38 week EFW 7#13oz, 82nd%  . Headache    migraines  . History of cesarean delivery 04/24/2016   Twins, breech, 6596m and pt not told she needs rpt. Pt told she's eligible  for TOLAC; she desires RCS.    Objective:  BP 110/70   Pulse 95   Temp 98.4 F (36.9 C) (Oral)   Wt 233 lb (105.7 kg)   LMP 02/09/2017   SpO2 98%   BMI 34.41 kg/m   Orthostatic Vitals:  - Lying: 110/64, 84; Sitting: 104/70, 94; Standing: 100/70, 112  Vitals and nursing note reviewed  General: NAD, pleasant Eyes: PERRL, EOMI, no conjunctival pallor or injection ENTM: Moist mucous membranes, no pharyngeal erythema or exudate Neck: Supple, no LAD Cardiovascular: RRR, no m/r/g, no LE edema, tender to palpation in middle of chest- reproducible pain Respiratory:  CTA BL, normal work of breathing Gastrointestinal: soft, nontender, nondistended, normoactive BS MSK: moves 4 extremities equally Derm: no rashes appreciated Neuro: alert and oriented, no focal deficits Psych: Appropriate affect  Dix-Hallpike Maneuver negative for nystagmus - After performing patient became nauseas, lost her balance, and dizzy- CBG which was 131, and orthostatic vitals which were normal  Assessment & Plan:    Benign paroxysmal positional vertigo Patient with negative Dix-Hallpike for nystagmus but classic symptoms described. Given meclizine for use with severe symtpoms and instructed to pump her breast prior to use, as this medication is not known to be safe while breastfeeding. Will also refer for vestibular therapy, given that patient's symptoms are so severe and limiting.   Migraine Patient with long-standing history of migraines. She has Fioricet at home that she takes when her migraines are unrelenting. Will refill this medication for her to use when she is no longer breastfeeding. Patient expresses understanding of this and repeats back to me not to use while breastfeeding.   She is instructed to use tylenol as needed for her pain and we will start her on a prophylactic given that her migraines come weekly and are limiting. Many drugs patient is unable to use due to breastfeeding or asthma, will try verapamil 40mg  TID. She is instructed to stop her amlodipine.   History of gestational hypertension Patient has been checking her blood pressure at home as her husband is a Engineer, civil (consulting). She has continued her amlodipine daily given that some readings have been 180-200 systolic. Will discontinue her amlodipine today, as we are trying verapamil for her migraines. She is instructed to return in a week for a blood pressure check.  Chest pain, muscular Given that pain is reproducible, does not radiate, does not feel like pressure, and patient is not diaphoretic or short of breath and  has no family hx of cardiac events. Will continue to monitor. Patient believes it to be associated with her posture and breastfeeding and pain is reproducible on exam. Patient to return in 1 week for blood pressure check.   Health care maintenance Patient due for pap smear, will perform at blood pressure check in 1 week.   [place] Shaquanda Graves, DO Family Medicine Resident PGY-1

## 2017-02-13 NOTE — Patient Instructions (Addendum)
Thank you for coming to see me today. It was a pleasure! Today we talked about:   Your vertigo. Please use the meclizine as needed. You may want to pump before use. It should get better over time. Please try to avoid any triggers that may make your vertigo worse. I will refer you to physical therapy for vestibular therapy.   For your migraines, I have given Verapamil 40mg  to take 3 times a day. Please stop taking the amlodipine. Use tylenol as needed. I have sent Fioricet to be refilled for when you stop breastfeeding, but do not use this while still breastfeeding.  Please follow-up with me next for a blood pressure check and a Pap Smear.  If you have any questions or concerns, please do not hesitate to call the office at [phone].  Take Care,   Janet Allexa Acoff, DO

## 2017-02-14 ENCOUNTER — Encounter: Payer: Self-pay | Admitting: Family Medicine

## 2017-02-14 DIAGNOSIS — Z0000 Encounter for general adult medical examination without abnormal findings: Secondary | ICD-10-CM | POA: Insufficient documentation

## 2017-02-14 DIAGNOSIS — R0789 Other chest pain: Secondary | ICD-10-CM | POA: Insufficient documentation

## 2017-02-14 DIAGNOSIS — H8110 Benign paroxysmal vertigo, unspecified ear: Secondary | ICD-10-CM | POA: Insufficient documentation

## 2017-02-14 NOTE — Assessment & Plan Note (Signed)
Patient due for pap smear, will perform at blood pressure check in 1 week.

## 2017-02-14 NOTE — Assessment & Plan Note (Signed)
Patient with negative Dix-Hallpike for nystagmus but classic symptoms described. Given meclizine for use with severe symtpoms and instructed to pump her breast prior to use, as this medication is not known to be safe while breastfeeding. Will also refer for vestibular therapy, given that patient's symptoms are so severe and limiting.

## 2017-02-14 NOTE — Assessment & Plan Note (Signed)
Given that pain is reproducible, does not radiate, does not feel like pressure, and patient is not diaphoretic or short of breath and has no family hx of cardiac events. Will continue to monitor. Patient believes it to be associated with her posture and breastfeeding and pain is reproducible on exam. Patient to return in 1 week for blood pressure check.

## 2017-02-14 NOTE — Assessment & Plan Note (Signed)
Patient with long-standing history of migraines. She has Fioricet at home that she takes when her migraines are unrelenting. Will refill this medication for her to use when she is no longer breastfeeding. Patient expresses understanding of this and repeats back to me not to use while breastfeeding.   She is instructed to use tylenol as needed for her pain and we will start her on a prophylactic given that her migraines come weekly and are limiting. Many drugs patient is unable to use due to breastfeeding or asthma, will try verapamil 40mg  TID. She is instructed to stop her amlodipine.

## 2017-02-14 NOTE — Assessment & Plan Note (Signed)
Patient has been checking her blood pressure at home as her husband is a Engineer, civil (consulting)nurse. She has continued her amlodipine daily given that some readings have been 180-200 systolic. Will discontinue her amlodipine today, as we are trying verapamil for her migraines. She is instructed to return in a week for a blood pressure check.

## 2017-02-16 ENCOUNTER — Other Ambulatory Visit: Payer: Self-pay | Admitting: *Deleted

## 2017-02-16 MED ORDER — MECLIZINE HCL 25 MG PO TABS: 25.0000 mg | ORAL_TABLET | Freq: Three times a day (TID) | ORAL | 0 refills | Status: DC | PRN

## 2017-02-16 NOTE — Telephone Encounter (Signed)
Pharmacy doesn't carry the 32mg  dose of meclizine and they will need a new script for the 25mg  dose sent over.  Will forward to MD to advise. Jazmin Hartsell,CMA

## 2017-02-24 ENCOUNTER — Ambulatory Visit (INDEPENDENT_AMBULATORY_CARE_PROVIDER_SITE_OTHER): Payer: Medicaid Other | Admitting: Family Medicine

## 2017-02-24 ENCOUNTER — Other Ambulatory Visit: Payer: Self-pay

## 2017-02-24 ENCOUNTER — Other Ambulatory Visit (HOSPITAL_COMMUNITY)
Admission: RE | Admit: 2017-02-24 | Discharge: 2017-02-24 | Disposition: A | Discharge status: Home / Self Care | Payer: Medicaid Other | Source: Ambulatory Visit | Attending: Family Medicine | Admitting: Family Medicine

## 2017-02-24 ENCOUNTER — Encounter: Payer: Self-pay | Admitting: Family Medicine

## 2017-02-24 VITALS — BP 117/80 | HR 74 | Temp 98.3°F | Ht 69.0 in | Wt 236.0 lb

## 2017-02-24 DIAGNOSIS — Z124 Encounter for screening for malignant neoplasm of cervix: Secondary | ICD-10-CM | POA: Insufficient documentation

## 2017-02-24 DIAGNOSIS — E65 Localized adiposity: Secondary | ICD-10-CM | POA: Diagnosis not present

## 2017-02-24 DIAGNOSIS — Z0000 Encounter for general adult medical examination without abnormal findings: Secondary | ICD-10-CM

## 2017-02-24 DIAGNOSIS — H8110 Benign paroxysmal vertigo, unspecified ear: Secondary | ICD-10-CM | POA: Diagnosis not present

## 2017-02-24 DIAGNOSIS — M20002 Unspecified deformity of left finger(s): Secondary | ICD-10-CM | POA: Diagnosis not present

## 2017-02-24 NOTE — Patient Instructions (Signed)
Thank you for coming to see me today. It was a pleasure! Today we talked about:   Your finger that is causing discomfort. I will refer you to a hand doctor for this.  Your pap-smear was performed, and I will release your results on MyChart.   The areas on the back of discomfort will need to be seen by a plastic surgeon.   Please follow-up with me in 3 months or as needed.  If you have any questions or concerns, please do not hesitate to call the office at [phone].  Take Care,   [place] Agostino Gorin, DO

## 2017-02-24 NOTE — Progress Notes (Signed)
Subjective:    Patient ID: Janet [name], female    DOB: [birth year], 32 y.o.   MRN: [id number]   CC:  HPI:  Vertigo: - Patient still experiencing vertigo symptoms on and off - Received call from therapy yesterday and will call today to schedule with them  R Middle Finger Abnormality: - Patient with inability to extend her R middle finger. - Reports she had snake bite as child and after surgery performed then, she has never been able to use her finger. 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 Finger deformity, acquired, right 02/27/2017  . 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 Benign paroxysmal positional vertigo 02/14/2017  . Chest pain, muscular 02/14/2017  . Health care maintenance 02/14/2017  . Asthma 07/24/2016  . Obesity, Class II, BMI 35-39.9 06/27/2016  . Migraine 06/12/2016  . Sickle cell trait (HCC) 05/15/2016  . History of gestational hypertension 04/24/2016     Objective:  BP 117/80   Pulse 74   Temp 98.3 F (36.8 C) (Oral)   Ht 5\' 9"  (1.753 m)   Wt 236 lb (107 kg)   LMP 02/09/2017   SpO2 98%   BMI 34.85 kg/m  Vitals and nursing note reviewed  General: NAD, pleasant Cardiac: RRR, normal heart sounds, no  murmurs Respiratory: CTAB, normal effort Abdomen: soft, nontender, nondistended. Bowel sounds present GU/GYN: External genitalia within normal limits.  Vaginal mucosa pink, moist, normal rugae.  Nonfriable cervix without lesions, no discharge or bleeding noted on speculum exam. No cervical motion tenderness. No adnexal masses bilaterally. Exam performed in the presence of a chaperone. Extremities: no edema or cyanosis. WWP. R finger middle finger with inability to extend. Skin: warm and dry, no rashes noted. BL lower back with areas of non-fluctuant concentrated subcutaneous fat on each flank that are not erythematous, non-tender on light palpation, but tender on deep palpation. Appears to be fat pads. Neuro: alert and oriented, no focal deficits  Assessment & Plan:   Health care maintenance Patient had normal pap smear and will need repeat in 5 years. BP is 117/80 after recent medication change to help with migraines. Reports that she has not had a migraine since her last visit  Benign paroxysmal positional vertigo Patient to call back for vestibular therapy today and schedule follow up appointment  Finger deformity, acquired, right Patient with recent feeling in finger and would like to have it looked at for possible surgery to correct her R middle finger. Reports she had snake bite as child and after surgery performed then, she has never been able to use her finger. Unable to extend. Will refer to hand ortho.  Fat pad Patient reports that she has lipomas on  her bilateral lower back and has a history of abscess on the L side which required surgery. On exam there is no palpable lipoma, cyst or fluctuance noted. Likely due to bilateral excess of fat deposition. Patient informed that she could be seen by a plastic surgeon for removal but that this may be considered an elective procedure, although the areas are painful at times for her.   Will refer her to plastic surgeon to  evaluate.    [place] Veleda Mun, DO Family Medicine Resident PGY-1

## 2017-02-26 LAB — CYTOLOGY - PAP
Adequacy: ABSENT
Diagnosis: NEGATIVE
HPV (WINDOPATH): NOT DETECTED

## 2017-02-27 DIAGNOSIS — M20002 Unspecified deformity of left finger(s): Secondary | ICD-10-CM | POA: Insufficient documentation

## 2017-02-27 DIAGNOSIS — E65 Localized adiposity: Secondary | ICD-10-CM | POA: Insufficient documentation

## 2017-02-27 NOTE — Assessment & Plan Note (Signed)
Patient to call back for vestibular therapy today and schedule follow up appointment

## 2017-02-27 NOTE — Assessment & Plan Note (Addendum)
Patient had normal pap smear and will need repeat in 5 years. BP is 117/80 after recent medication change to help with migraines. Reports that she has not had a migraine since her last visit

## 2017-02-27 NOTE — Assessment & Plan Note (Addendum)
Patient reports that she has lipomas on her bilateral lower back and has a history of abscess on the L side which required surgery. On exam there is no palpable lipoma, cyst or fluctuance noted. Likely due to bilateral excess of fat deposition. Patient informed that she could be seen by a plastic surgeon for removal but that this may be considered an elective procedure, although the areas are painful at times for her.   Will refer her to plastic surgeon to evaluate.

## 2017-02-27 NOTE — Assessment & Plan Note (Addendum)
Patient with recent feeling in finger and would like to have it looked at for possible surgery to correct her R middle finger. Reports she had snake bite as child and after surgery performed then, she has never been able to use her finger. Unable to extend. Will refer to hand ortho.

## 2017-03-10 ENCOUNTER — Other Ambulatory Visit: Payer: Self-pay

## 2017-03-10 ENCOUNTER — Encounter: Payer: Self-pay | Admitting: Physical Therapy

## 2017-03-10 ENCOUNTER — Ambulatory Visit: Payer: Medicaid Other | Attending: Family Medicine | Admitting: Physical Therapy

## 2017-03-10 DIAGNOSIS — H8113 Benign paroxysmal vertigo, bilateral: Secondary | ICD-10-CM | POA: Diagnosis not present

## 2017-03-10 DIAGNOSIS — R42 Dizziness and giddiness: Secondary | ICD-10-CM | POA: Diagnosis present

## 2017-03-10 NOTE — Patient Instructions (Signed)
Sit to Side-Lying    Sit on edge of bed. 1. Turn head 45 to right. 2. Maintain head position and lie down slowly on left side. Hold until symptoms subside. 3. Sit up slowly. Hold until symptoms subside. 4. Turn head 45 to left. 5. Maintain head position and lie down slowly on right side. Hold until symptoms subside. 6. Sit up slowly. Repeat sequence __5__ times per session. Do _3___ sessions per day.  Copyright  VHI. All rights reserved.  Benign Positional Vertigo Vertigo is the feeling that you or your surroundings are moving when they are not. Benign positional vertigo is the most common form of vertigo. The cause of this condition is not serious (is benign). This condition is triggered by certain movements and positions (is positional). This condition can be dangerous if it occurs while you are doing something that could endanger you or others, such as driving. What are the causes? In many cases, the cause of this condition is not known. It may be caused by a disturbance in an area of the inner ear that helps your brain to sense movement and balance. This disturbance can be caused by a viral infection (labyrinthitis), head injury, or repetitive motion. What increases the risk? This condition is more likely to develop in:  Women.  People who are 33 years of age or older.  What are the signs or symptoms? Symptoms of this condition usually happen when you move your head or your eyes in different directions. Symptoms may start suddenly, and they usually last for less than a minute. Symptoms may include:  Loss of balance and falling.  Feeling like you are spinning or moving.  Feeling like your surroundings are spinning or moving.  Nausea and vomiting.  Blurred vision.  Dizziness.  Involuntary eye movement (nystagmus).  Symptoms can be mild and cause only slight annoyance, or they can be severe and interfere with daily life. Episodes of benign positional vertigo may return (recur)  over time, and they may be triggered by certain movements. Symptoms may improve over time. How is this diagnosed? This condition is usually diagnosed by medical history and a physical exam of the head, neck, and ears. You may be referred to a health care provider who specializes in ear, nose, and throat (ENT) problems (otolaryngologist) or a provider who specializes in disorders of the nervous system (neurologist). You may have additional testing, including:  MRI.  A CT scan.  Eye movement tests. Your health care provider may ask you to change positions quickly while he or she watches you for symptoms of benign positional vertigo, such as nystagmus. Eye movement may be tested with an electronystagmogram (ENG), caloric stimulation, the Dix-Hallpike test, or the roll test.  An electroencephalogram (EEG). This records electrical activity in your brain.  Hearing tests.  How is this treated? Usually, your health care provider will treat this by moving your head in specific positions to adjust your inner ear back to normal. Surgery may be needed in severe cases, but this is rare. In some cases, benign positional vertigo may resolve on its own in 2-4 weeks. Follow these instructions at home: Safety  Move slowly.Avoid sudden body or head movements.  Avoid driving.  Avoid operating heavy machinery.  Avoid doing any tasks that would be dangerous to you or others if a vertigo episode would occur.  If you have trouble walking or keeping your balance, try using a cane for stability. If you feel dizzy or unstable, sit down right away.  Return to your normal activities as told by your health care provider. Ask your health care provider what activities are safe for you. General instructions  Take over-the-counter and prescription medicines only as told by your health care provider.  Avoid certain positions or movements as told by your health care provider.  Drink enough fluid to keep your urine  clear or pale yellow.  Keep all follow-up visits as told by your health care provider. This is important. Contact a health care provider if:  You have a fever.  Your condition gets worse or you develop new symptoms.  Your family or friends notice any behavioral changes.  Your nausea or vomiting gets worse.  You have numbness or a "pins and needles" sensation. Get help right away if:  You have difficulty speaking or moving.  You are always dizzy.  You faint.  You develop severe headaches.  You have weakness in your legs or arms.  You have changes in your hearing or vision.  You develop a stiff neck.  You develop sensitivity to light. This information is not intended to replace advice given to you by your health care provider. Make sure you discuss any questions you have with your health care provider. Document Released: 10/07/2005 Document Revised: 06/07/2015 Document Reviewed: 04/24/2014 Elsevier Interactive Patient Education  [employer].

## 2017-03-11 ENCOUNTER — Other Ambulatory Visit: Payer: Self-pay

## 2017-03-11 NOTE — Therapy (Signed)
[facility] [address] [place], [place], [address] Phone: [phone]   Fax:  [phone]  Physical Therapy Evaluation  Patient Details  Name: Janet [name] MRN: [id number] Date of Birth: [birth year] Referring Provider: Janit Pagan, MD   Encounter Date: 03/10/2017  PT End of Session - 03/11/17 0937    Visit Number  1    Number of Visits  4    Date for PT Re-Evaluation  04/09/17    Authorization Type  Medicaid    PT Start Time  1316    PT Stop Time  1400    PT Time Calculation (min)  44 min       Past Medical History:  Diagnosis Date  . Anemia   . Asthma   . Depression   . Gestational diabetes   . Gestational diabetes mellitus, antepartum 08/12/2016   Moved pt to BRX GDM program Growth [place] at 38 week EFW 7#13oz, 82nd%  . Headache    migraines  . History of cesarean delivery 04/24/2016   Twins, breech, 78m and pt not told she needs rpt. Pt told she's eligible for TOLAC; she desires RCS.     Past Surgical History:  Procedure Laterality Date  . CESAREAN SECTION    . CESAREAN SECTION N/A 10/17/2016   Procedure: REPEAT CESAREAN SECTION;  Surgeon: Adam Phenix, MD;  Location: [facility] BIRTH[facility] SUIT[facility];  Service: Obstetrics;  Laterality: N/A;  . DILATION AND CURETTAGE OF UTERUS      There were no vitals filed for this visit.   Subjective Assessment - 03/11/17 0926    Subjective  Pt reports most recent episode of vertigo occurred 3 days ago; usually happens when she lies down but sometimes occurs spontaneously without warning; has being occurring intermittently since insertion of contraception on 01-07-17 Referral says BPPV - unspecified laterality    Pertinent History  Sickle cell trait; migraines; chest pain (muscular); h/o gestational HTN    Patient Stated Goals  resolve the vertigo    Currently in Pain?  No/denies         [facility] PT Assessment - 03/11/17 0001      Assessment   Medical Diagnosis  BPPV  -unspecified laterality    Referring Provider  Janit Pagan, MD    Onset Date/Surgical Date  -- end of Dec. 2018    Prior Therapy  none      Precautions   Precautions  Other (comment) intermittent vertigo      Balance Screen   Has the patient fallen in the past 6 months  No    Has the patient had a decrease in activity level because of a fear of falling?   No    Is the patient reluctant to leave their home because of a fear of falling?   No      Prior Function   Level of Independence  Independent         Vestibular Assessment - 03/11/17 0001      Vestibular Assessment   General Observation  Pt is a 32 yr old female with c/o intermittent vertigo that she says started approx. 2 months ago after she had contraceptive implanted. Pt reports vertigo usually occurs when she lies down on her back but says it also occurs spontaneously.  She reports she often has nausea and vomiting with it.  Pt reports most recent episode occurred 3 days ago (on Sunday) and lasted approx. 2"- 5" and then resolved.  Symptom Behavior   Type of Dizziness  Spinning    Frequency of Dizziness  varies in occurrence - most recent episode occurred 3 days ago    Duration of Dizziness  2-5"    Aggravating Factors  Lying supine    Relieving Factors  Head stationary      Occulomotor Exam   Occulomotor Alignment  Normal    Spontaneous  Absent      Positional Testing   Dix-Hallpike  Dix-Hallpike Right;Dix-Hallpike Left    Sidelying Test  Sidelying Right;Sidelying Left      Dix-Hallpike Right   Dix-Hallpike Right Duration  None    Dix-Hallpike Right Symptoms  No nystagmus      Dix-Hallpike Left   Dix-Hallpike Left Duration  None    Dix-Hallpike Left Symptoms  No nystagmus      Sidelying Right   Sidelying Right Duration  None    Sidelying Right Symptoms  No nystagmus      Sidelying Left   Sidelying Left Duration  None    Sidelying Left Symptoms  No nystagmus      Positional Sensitivities   Up  from Right Hallpike  Mild dizziness    Up from Left Hallpike  Mild dizziness    Positional Sensitivities Comments  Pt reported mild dizziness with sidelying to sitting from both Rt and Lt sides - resolved within 5 secs         Objective measurements completed on examination: See above findings.              PT Education - 03/11/17 0936    Education provided  Yes    Education Details  Pt educated in etiology and characteristics of BPPV; instructed in Brandt-Daroff exercises for prn should vertigo re-occur    Person(s) Educated  Patient    Methods  Explanation;Handout    Comprehension  Verbalized understanding;Returned demonstration          PT Long Term Goals - 03/11/17 0946      PT LONG TERM GOAL #1   Title  Pt will report no vertigo with bed mobility, including sidelying/supine to sitting posiiton.    Baseline  Pt reports mild dizziness with supine or sidelying to sitting position but quickly subsides    Time  30    Period  Days    Status  New    Target Date  04/09/17      PT LONG TERM GOAL #2   Title  Independent in HEP for Brandt-Daroff exercises for habituation of vertigo.    Baseline  Dependent    Time  30    Period  Days    Status  New    Target Date  04/09/17             Plan - 03/11/17 1610    Clinical Impression Statement  Pt is a 32 year old female who presents with c/o intermittent dizziness which has been occurring for past 2 months after implantation of contraceptive device.  No nystagmus was noted with any positional testing and no spinning vertigo was provoked with any positional testing.  Pt's report of symptoms are consistent with BPPV, however, pt had (-) Rt and Lt Dix-Hallpike tests with no c/o vertigo in test position and no nystagmus occurring.  Pt did report mild dizziness with return to upright sitting from both Dix-Hallpike test positions and from Rt and Lt sidelying positions.      History and Personal Factors relevant to plan of  care:  Migraines:  sickle cell trait:  h/o gestational HTN    Clinical Presentation  Evolving    Clinical Presentation due to:  BPPV? Referral states BPPV - unspecified laterality    Clinical Decision Making  Moderate    Rehab Potential  Good    PT Frequency  1x / week 3 visits within 30 days    PT Duration  3 weeks    PT Treatment/Interventions  ADLs/Self Care Home Management;Canalith Repostioning;Therapeutic activities;Neuromuscular re-education;Patient/family education;Vestibular;Balance training Pt/family education    PT Next Visit Plan  re-assess vertigo based on pt's symptoms (BPPV?);  check HEP    PT Home Exercise Plan  Brandt-Daroff exs. for habituation    Consulted and Agree with Plan of Care  Patient       Patient will benefit from skilled therapeutic intervention in order to improve the following deficits and impairments:  Dizziness  Visit Diagnosis: BPPV (benign paroxysmal positional vertigo), bilateral - Plan: PT plan of care cert/re-cert  Dizziness and giddiness - Plan: PT plan of care cert/re-cert     Problem List Patient Active Problem List   Diagnosis Date Noted  . Finger deformity, acquired, right 02/27/2017  . 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 Benign paroxysmal positional vertigo 02/14/2017  . Chest pain, muscular 02/14/2017  . Health care maintenance 02/14/2017  . Asthma 07/24/2016  . Obesity, Class II, BMI 35-39.9 06/27/2016  . Migraine 06/12/2016  . Sickle cell trait (HCC) 05/15/2016  . History of gestational hypertension 04/24/2016    Kary KosDilday, Shulem Mader Suzanne, PT 03/11/2017, 9:52 AM  [facility] [address] [place], [place], [address] Phone: [phone]   Fax:  [phone]  Name: Janet [name] MRN: [id number] Date of Birth: 04/19/1985

## 2017-03-17 DIAGNOSIS — M24541 Contracture, right hand: Secondary | ICD-10-CM | POA: Insufficient documentation

## 2017-03-24 ENCOUNTER — Ambulatory Visit: Payer: Medicaid Other | Attending: Family Medicine | Admitting: Physical Therapy

## 2017-03-24 VITALS — BP 120/82 | HR 82

## 2017-03-24 DIAGNOSIS — R42 Dizziness and giddiness: Secondary | ICD-10-CM | POA: Diagnosis present

## 2017-03-24 DIAGNOSIS — H8113 Benign paroxysmal vertigo, bilateral: Secondary | ICD-10-CM | POA: Diagnosis present

## 2017-03-25 ENCOUNTER — Encounter: Payer: Self-pay | Admitting: Physical Therapy

## 2017-03-25 NOTE — Therapy (Signed)
[facility] [address] [place], [place], [address] Phone: [phone]   Fax:  [phone]  Physical Therapy Treatment  Patient Details  Name: Gwynne EdingerSylvia Haak MRN: [id number] Date of Birth: 11/22/1985 Referring Provider: Janit PaganKehinde Eniola, MD   Encounter Date: 03/24/2017  PT End of Session - 03/25/17 1111    Visit Number  2    Number of Visits  4    Date for PT Re-Evaluation  04/09/17    Authorization Type  Medicaid    PT Start Time  1317    PT Stop Time  1346 session ended early per pt request    PT Time Calculation (min)  29 min       Past Medical History:  Diagnosis Date  . Anemia   . Asthma   . Depression   . Gestational diabetes   . Gestational diabetes mellitus, antepartum 08/12/2016   Moved pt to BRX GDM program Growth [place] at 38 week EFW 7#13oz, 82nd%  . Headache    migraines  . History of cesarean delivery 04/24/2016   Twins, breech, 6220m and pt not told she needs rpt. Pt told she's eligible for TOLAC; she desires RCS.     Past Surgical History:  Procedure Laterality Date  . CESAREAN SECTION    . CESAREAN SECTION N/A 10/17/2016   Procedure: REPEAT CESAREAN SECTION;  Surgeon: Adam PhenixArnold, James G, MD;  [facility];  Service: Obstetrics;  Laterality: N/A;  . DILATION AND CURETTAGE OF UTERUS      Vitals:   03/24/17 1321  BP: 120/82  Pulse: 82    Subjective Assessment - 03/25/17 1110    Subjective  Pt states she had a severe episode of vertigo on Sunday - lasted about 5" and then she got sick - had nausea and vomiting; pt states she has not felt well since the episode on Sunday: states she has a cold which may be contributing to how she feels now    Pertinent History  Sickle cell trait; migraines; chest pain (muscular); h/o gestational HTN    Patient Stated Goals  resolve the vertigo       Rt sidelying test (-) with no nystagmus and no c/o vertigo in test position;  Pt did c/o dizziness with  return to upright sitting from sidelying Lt sidelying test (-) with no nystagmus and no c/o vertigo in test position  Rt Dix-Hallpike test (-) for nystagmus - pt did c/o dizziness in test position but unable to tolerate lying in test position longer than 3-4 secs -- this was Attempted twice and pt unable to remain in position to determine if dizziness would subside; pt denied room-spinning vertigo but states "it's just dizziness"  (-) Lt Dix-Hallpike test with no nystagmus and no significant change in dizziness reported in test position  Attempted to perform smooth pursuit testing but pt unable to track with eyes only and moved her head during testing  Pt states that she "just doesn't feel well" - states she is very tired, doesn't get much sleep due to the baby and also she has a cold at this time Which she wonders if this is contributing to the dizziness;  Pt continues to deny room- spinning vertigo at this time  Etiology of vertigo is unknown - no nystagmus noted with any positional testing - pt declined Epley maneuver to determine if this treatment Would improve the dizziness  PT Education - 03/25/17 1110    Education provided  Yes    Education Details  Reviewed Brandt-Daroff exercises as previously given    Person(s) Educated  Patient    Methods  Explanation    Comprehension  Verbalized understanding          PT Long Term Goals - 03/11/17 0946      PT LONG TERM GOAL #1   Title  Pt will report no vertigo with bed mobility, including sidelying/supine to sitting posiiton.    Baseline  Pt reports mild dizziness with supine or sidelying to sitting position but quickly subsides    Time  30    Period  Days    Status  New    Target Date  04/09/17      PT LONG TERM GOAL #2   Title  Independent in HEP for Brandt-Daroff exercises for habituation of vertigo.    Baseline  Dependent    Time  30    Period  Days    Status  New    Target Date   04/09/17            Plan - 03/25/17 1112    Clinical Impression Statement  No nystagmus observed during Rt nor Lt Dix-Hallpike position, but pt reported dizziness in Rt Dix-Hallpike position: pt unable to tolerate remaining in test position until dizziness subsided - only tolerated lying supine approx. 4 secs before she returned to upright sitting:  unclear if pt has Rt BPPV as no nystagmus has been observed during any positional testing:  pt declined Epley maneuver on 03-24-17 due to not feeling well and did not want to risk provocation of incr. dizziness    PT Frequency  1x / week    PT Duration  3 weeks    PT Treatment/Interventions  ADLs/Self Care Home Management;Canalith Repostioning;Therapeutic activities;Neuromuscular re-education;Patient/family education;Vestibular;Balance training    PT Next Visit Plan  re-assess vertigo based on pt's symptoms (BPPV?);  check HEP    PT Home Exercise Plan  Brandt-Daroff exs. for habituation    Consulted and Agree with Plan of Care  Patient       Patient will benefit from skilled therapeutic intervention in order to improve the following deficits and impairments:  Dizziness  Visit Diagnosis: BPPV (benign paroxysmal positional vertigo), bilateral  Dizziness and giddiness     Problem List Patient Active Problem List   Diagnosis Date Noted  . Finger deformity, acquired, right 02/27/2017  . 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 Benign paroxysmal positional vertigo 02/14/2017  . Chest pain, muscular 02/14/2017  . Health care maintenance 02/14/2017  . Asthma 07/24/2016  . Obesity, Class II, BMI 35-39.9 06/27/2016  . Migraine 06/12/2016  . Sickle cell trait (HCC) 05/15/2016  . History of gestational hypertension 04/24/2016    Kary Kos, PT 03/25/2017, 11:17 AM  [facility] [address] [place], [place], [address] Phone: [phone]   Fax:  [phone]  Name: Lyndell Gillyard MRN: [id number] Date of Birth: 03-15-1985

## 2017-05-16 ENCOUNTER — Other Ambulatory Visit: Payer: Self-pay | Admitting: Family Medicine

## 2017-05-27 NOTE — Telephone Encounter (Signed)
2nd request received from pharmacy. Deonne Rooks,CMA  

## 2017-11-19 ENCOUNTER — Encounter: Payer: Self-pay | Admitting: Obstetrics & Gynecology

## 2017-11-19 ENCOUNTER — Ambulatory Visit (INDEPENDENT_AMBULATORY_CARE_PROVIDER_SITE_OTHER): Payer: Medicaid Other | Admitting: Obstetrics & Gynecology

## 2017-11-19 VITALS — BP 127/77 | HR 86 | Wt 240.0 lb

## 2017-11-19 DIAGNOSIS — Z3046 Encounter for surveillance of implantable subdermal contraceptive: Secondary | ICD-10-CM

## 2017-11-19 DIAGNOSIS — Z23 Encounter for immunization: Secondary | ICD-10-CM

## 2017-11-19 NOTE — Progress Notes (Signed)
I have reviewed the chart and agree with nursing staff's documentation of this patient's encounter.  Jaynie Collins, MD 11/19/2017 4:45 PM

## 2017-11-19 NOTE — Progress Notes (Signed)
     GYNECOLOGY OFFICE NOTE  Janet [name] is a 31 y.o. [phone] here for Nexplanon removal.  Was inserted in 12/2016. Since then, she reports weight gain, intense mood swings and does "not like the way I feel". She hates not having periods, "where is the blood going?". She want it removed.  No plans for contraception at this time.   Last pap smear was on 02/24/2017 and was normal.  No other gynecologic concerns.  Counseled patient about Nexplanon, its mechanism of action and side effects. Talked about its efficacy. Told her not having periods was expected given ovulation suppression; and she became hesitant to remove it. Wants to talk to her husband.  Not as concerned about weight changes and mood changes as she was about the missed periods, but now she knows why she is not having periods and she is okay with it.    She will return later if she wants to remove it to to the other side effects; will also decide on what contraception she will use instead.  Of note, flu vaccine given to her as requested    Total face-to-face time with patient: 10 minutes. Over 50% of encounter was spent on counseling and coordination of care.   Jaynie Collins, MD, FACOG Obstetrician & Gynecologist, [facility] for [employer], [facility]

## 2017-11-19 NOTE — Patient Instructions (Signed)
Return to clinic for any scheduled appointments or obstetric concerns, or go to MAU for evaluation  

## 2017-11-19 NOTE — Progress Notes (Signed)
Pt in office for Flu vaccine today. Pt tolerated injection well.

## 2017-11-24 ENCOUNTER — Ambulatory Visit (INDEPENDENT_AMBULATORY_CARE_PROVIDER_SITE_OTHER): Payer: Medicaid Other | Admitting: Family Medicine

## 2017-11-24 ENCOUNTER — Encounter: Payer: Self-pay | Admitting: Family Medicine

## 2017-11-24 VITALS — BP 120/75 | HR 74 | Temp 98.6°F | Wt 240.8 lb

## 2017-11-24 DIAGNOSIS — M25531 Pain in right wrist: Secondary | ICD-10-CM | POA: Diagnosis not present

## 2017-11-24 DIAGNOSIS — G8929 Other chronic pain: Secondary | ICD-10-CM

## 2017-11-24 DIAGNOSIS — M25532 Pain in left wrist: Secondary | ICD-10-CM

## 2017-11-24 DIAGNOSIS — M79645 Pain in left finger(s): Secondary | ICD-10-CM | POA: Diagnosis not present

## 2017-11-24 DIAGNOSIS — M545 Low back pain, unspecified: Secondary | ICD-10-CM

## 2017-11-24 DIAGNOSIS — G43109 Migraine with aura, not intractable, without status migrainosus: Secondary | ICD-10-CM | POA: Diagnosis not present

## 2017-11-24 MED ORDER — GABAPENTIN 100 MG PO CAPS: 100.0000 mg | ORAL_CAPSULE | Freq: Three times a day (TID) | ORAL | 0 refills | Status: DC

## 2017-11-24 MED ORDER — SUMATRIPTAN SUCCINATE 50 MG PO TABS: 50.0000 mg | ORAL_TABLET | ORAL | 0 refills | Status: DC | PRN

## 2017-11-24 MED ORDER — DICLOFENAC SODIUM 1 % TD GEL: 4.0000 g | Freq: Four times a day (QID) | TRANSDERMAL | 0 refills | Status: DC

## 2017-11-24 NOTE — Patient Instructions (Addendum)
It was great meeting you today! I am sorry that you have been having so much trouble with your buttock pain. You are also having trouble with your left middle finger and your right wrist. For this I will give you a topical pain medication called voltaren gel. It is not systemically absorbed so it should be fine with your history of ulcers. I will also start you on a medication called gabapentin which could help with some of your pain. For your migraines I restarted you on imitrex.

## 2017-11-26 ENCOUNTER — Encounter: Payer: Self-pay | Admitting: Family Medicine

## 2017-11-26 DIAGNOSIS — M25531 Pain in right wrist: Secondary | ICD-10-CM | POA: Insufficient documentation

## 2017-11-26 DIAGNOSIS — M79645 Pain in left finger(s): Secondary | ICD-10-CM | POA: Insufficient documentation

## 2017-11-26 DIAGNOSIS — M545 Low back pain, unspecified: Secondary | ICD-10-CM | POA: Insufficient documentation

## 2017-11-26 DIAGNOSIS — G8929 Other chronic pain: Secondary | ICD-10-CM | POA: Insufficient documentation

## 2017-11-26 HISTORY — DX: Pain in right wrist: M25.531

## 2017-11-26 HISTORY — DX: Pain in left finger(s): M79.645

## 2017-11-26 NOTE — Assessment & Plan Note (Signed)
Limited flexion extension right wrist.  Never had wrist x-rays.  Will send for wrist x-rays.

## 2017-11-26 NOTE — Progress Notes (Signed)
HPI 32 year old female who presents same day visit for upper buttock pain bilaterally and migraines.  She states this upper buttock pain which is pleasant present in bilateral distribution and has been going on for "5 or 6 months".  When asked why she felt the need to make a same-day visit she said that it got acutely worse on 11/23/2017 which prompted her visit on 11/24/2017.  She states she is taken nothing for pain has not tried anything.  She cannot take NSAIDs because they give her "ulcers".  She has an allergy in the chart of facial swelling and temporary blindness to NSAIDs but says this is not true and that it is just on the ulcers.  The pain is described as a sharp, burning sensation in her upper buttock.  There is no traumatic event preceding this.  Patient she also has chronic migraines.  These been going on for years and she is taking Imitrex which controlled her migraines pretty well.  She did not refill her prescription for unknown reasons and has not been taking it for a while now.  She is as a pain behind one or both eyes, and she occasionally has vision problems including seeing auras.  She states that she has right wrist pain as well.  Her right wrist is unable to be extended without pain.  She also has problems with her left middle finger, she states that she is unable to flex at the PIP joint.   CC: Upper buttock pain and migraines  ROS:   Review of Systems See HPI for ROS.   CC, SH/smoking status, and VS noted  Objective: BP 120/75 (BP Location: Left Arm, Patient Position: Sitting, Cuff Size: Normal)   Pulse 74   Temp 98.6 F (37 C) (Oral)   Wt 240 lb 12.8 oz (109.2 kg)   SpO2 97%   BMI 35.56 kg/m  Gen: Pleasant, 32 year old, African-American female resting comfortably on exam table HEENT: NCAT, EOMI, PERRL CV: RRR, no murmur Resp: CTAB, no wheezes, non-labored Abd: SNTND, BS present, no guarding or organomegaly Ext: No edema, warm Neuro: Alert and oriented,  Speech clear, No gross deficits Lower back/upper buttock: No tenderness to palpation in painful distribution at border of lower back and upper buttock.  No limitation with flexion or extension.  Right wrist:  Inspection: No focal asymmetry between left and right wrist. Range of motion: Slight limitation on both flexion and extension of right wrist, but able to be passively flexed and extended Strength: 5 out of 5 strength in both flexion and extension of right wrist Neurovascular: Sensation intact all distribution, good cap refill, palpable ulnar radial pulse  Left Middle finger: Inspection: Slightly swollen left middle finger Range of motion: Unable to actively flex or extend at DIP, able to passively flex and extend with pain. Strength: Limited strength in both flexion and extension of left middle finger Neurovascular: Good cap refill, sensation intact in left middle finger   Assessment and plan:  Migraine Patient currently breast-feeding.  Imitrex is the only thing is really worked for her in the past.  Per manufacturer's label patiently to wait 8 hours after taking to breast-feed again.  Highly recommended taking his medication prior to sleeping.  Was prescribed verapamil but unclear if she is actually taking his medication.  Will defer prophylactic management to PCP and will start sumatriptan at this time. -Imitrex 50 mg every 2 hours as needed -Per manufacture label need to wait 8 hours from medication time to breast-feed  time  Chronic bilateral low back pain without sciatica Chronic lower back pain/upper buttock pain of unknown etiology.  Prescription for pain to consistent with a nervous process but could also be muscular skeletal.  Will get lumbar spine x-ray to see if she has some component of osteoarthritis.  Without obvious trauma history have low suspicion for disc herniation.  No alarm symptoms such as sciatica or leg weakness.  We will also proceed with bilateral hip x-rays as  she can have a little referred pain to her back from hip osteoarthritis.  Can give trial of topical Voltaren gel.  Given the neuropathic nature of her pain we will try gabapentin 100 mg.  Per manufacture label there is a very low amount of gabapentin that passes into breastmilk.  Discussed pros and cons with mother and she is in agreement to try medication with proper infant monitoring. - Lumbar spine x-ray, bilateral hip x-rays as above - Voltaren gel to apply topically to back/upper buttock - Gabapentin 100 mg 3 times daily as needed - Discussed proper infant monitoring with mother  Right wrist pain Limited flexion extension right wrist.  Never had wrist x-rays.  Will send for wrist x-rays.  Finger pain, left Unable to flex or extend DIP joint and middle finger of left hand.  Looks like patient was referred to hand surgery but never made that appointment. No imaging in the chart so will also send for left hand xray. If no obvious source seen then will consider MRI vs re-referral to hand surgery. Cost could be an issue with an MRI. - left hand xray   Orders Placed This Encounter  Procedures  . DG Hand Complete Left    Standing Status:   Future    Standing Expiration Date:   01/25/2019    Order Specific Question:   Reason for Exam (SYMPTOM  OR DIAGNOSIS REQUIRED)    Answer:   left middle finger pain    Order Specific Question:   Is patient pregnant?    Answer:   No    Order Specific Question:   Preferred imaging location?    Answer:   [facility]    Order Specific Question:   Radiology Contrast Protocol - do NOT remove file path    Answer:   \\[email or web address]\[email or web address]\DXFluoroContrastProtocols.pdf  . DG Lumbar Spine Complete    Standing Status:   Future    Standing Expiration Date:   01/25/2019    Order Specific Question:   Reason for Exam (SYMPTOM  OR DIAGNOSIS REQUIRED)    Answer:   lumbar spine pain    Order Specific Question:   Is patient pregnant?    Answer:   No    Order  Specific Question:   Preferred imaging location?    Answer:   [facility]    Order Specific Question:   Radiology Contrast Protocol - do NOT remove file path    Answer:   \\[email or web address]\[email or web address]s.pdf  . DG HIP UNILAT W OR W/O PELVIS 2-3 VIEWS LEFT    Standing Status:   Future    Standing Expiration Date:   01/25/2019    Order Specific Question:   Reason for Exam (SYMPTOM  OR DIAGNOSIS REQUIRED)    Answer:   left hip pain    Order Specific Question:   Is patient pregnant?    Answer:   No    Order Specific Question:   Preferred imaging location?    Answer:   [facility]    Order Specific Question:  Radiology Contrast Protocol - do NOT remove file path    Answer:   \\[email or web address]\[email or web address]s.pdf  . DG HIP UNILAT W OR W/O PELVIS 2-3 VIEWS RIGHT    Standing Status:   Future    Standing Expiration Date:   01/25/2019    Order Specific Question:   Reason for Exam (SYMPTOM  OR DIAGNOSIS REQUIRED)    Answer:   right hip pain    Order Specific Question:   Is patient pregnant?    Answer:   No    Order Specific Question:   Preferred imaging location?    Answer:   [facility]    Order Specific Question:   Radiology Contrast Protocol - do NOT remove file path    Answer:   \\[email or web address]\[email or web address]s.pdf  . DG Wrist Complete Right    Standing Status:   Future    Standing Expiration Date:   01/25/2019    Order Specific Question:   Reason for Exam (SYMPTOM  OR DIAGNOSIS REQUIRED)    Answer:   right wrist pain    Order Specific Question:   Is patient pregnant?    Answer:   No    Order Specific Question:   Preferred imaging location?    Answer:   [facility]    Order Specific Question:   Radiology Contrast Protocol - do NOT remove file path    Answer:   \\[email or web address]\[email or web address]\DXFluoroContrastProtocols.pdf    Meds ordered this encounter  Medications  . gabapentin (NEURONTIN) 100 MG  capsule    Sig: Take 1 capsule (100 mg total) by mouth 3 (three) times daily.    Dispense:  90 capsule    Refill:  0  . diclofenac sodium (VOLTAREN) 1 % GEL    Sig: Apply 4 g topically 4 (four) times daily.    Dispense:  100 g    Refill:  0  . SUMAtriptan (IMITREX) 50 MG tablet    Sig: Take 1 tablet (50 mg total) by mouth every 2 (two) hours as needed for migraine. May repeat in 2 hours if headache persists or recurs.    Dispense:  10 tablet    Refill:  0    Myrene BuddyJacob Zaire Vanbuskirk MD PGY-2 Family Medicine Resident  11/26/2017 4:54 PM

## 2017-11-26 NOTE — Assessment & Plan Note (Signed)
Patient currently breast-feeding.  Imitrex is the only thing is really worked for her in the past.  Per manufacturer's label patiently to wait 8 hours after taking to breast-feed again.  Highly recommended taking his medication prior to sleeping.  Was prescribed verapamil but unclear if she is actually taking his medication.  Will defer prophylactic management to PCP and will start sumatriptan at this time. -Imitrex 50 mg every 2 hours as needed -Per manufacture label need to wait 8 hours from medication time to breast-feed time

## 2017-11-26 NOTE — Assessment & Plan Note (Signed)
Unable to flex or extend DIP joint and middle finger of left hand.  Looks like patient was referred to hand surgery but never made that appointment. No imaging in the chart so will also send for left hand xray. If no obvious source seen then will consider MRI vs re-referral to hand surgery. Cost could be an issue with an MRI. - left hand xray

## 2017-11-26 NOTE — Assessment & Plan Note (Signed)
Chronic lower back pain/upper buttock pain of unknown etiology.  Prescription for pain to consistent with a nervous process but could also be muscular skeletal.  Will get lumbar spine x-ray to see if she has some component of osteoarthritis.  Without obvious trauma history have low suspicion for disc herniation.  No alarm symptoms such as sciatica or leg weakness.  We will also proceed with bilateral hip x-rays as she can have a little referred pain to her back from hip osteoarthritis.  Can give trial of topical Voltaren gel.  Given the neuropathic nature of her pain we will try gabapentin 100 mg.  Per manufacture label there is a very low amount of gabapentin that passes into breastmilk.  Discussed pros and cons with mother and she is in agreement to try medication with proper infant monitoring. - Lumbar spine x-ray, bilateral hip x-rays as above - Voltaren gel to apply topically to back/upper buttock - Gabapentin 100 mg 3 times daily as needed - Discussed proper infant monitoring with mother

## 2017-12-04 ENCOUNTER — Other Ambulatory Visit: Payer: Self-pay | Admitting: Family Medicine

## 2017-12-04 ENCOUNTER — Ambulatory Visit
Admission: RE | Admit: 2017-12-04 | Discharge: 2017-12-04 | Disposition: A | Discharge status: Home / Self Care | Payer: Medicaid Other | Source: Ambulatory Visit | Attending: Family Medicine | Admitting: Family Medicine

## 2017-12-04 DIAGNOSIS — M545 Low back pain: Secondary | ICD-10-CM

## 2017-12-04 DIAGNOSIS — G8929 Other chronic pain: Secondary | ICD-10-CM

## 2017-12-04 DIAGNOSIS — M25532 Pain in left wrist: Secondary | ICD-10-CM

## 2017-12-04 DIAGNOSIS — M25531 Pain in right wrist: Secondary | ICD-10-CM

## 2018-07-02 IMAGING — US US OB COMP LESS 14 WK
1 series · 15 of 28 positions shown · non-contrast
Comparison: None.

CLINICAL DATA: Vaginal bleeding in early pregnancy. Estimated
gestational age per LMP 9 weeks 3 days. Beta HCG pending.

EXAM:
OBSTETRIC <14 WK ULTRASOUND; TRANSVAGINAL OB ULTRASOUND
TECHNIQUE: Transvaginal ultrasound was performed for complete evaluation of the
gestation as well as the maternal uterus, adnexal regions, and
pelvic cul-de-sac.

[Series 1: us ob comp less 14 wk · 70 acquisitions, 15 frames shown]
[im 1/70]
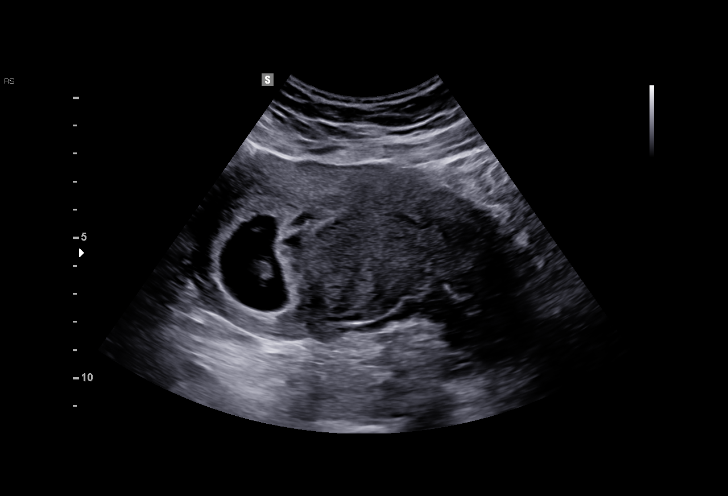
[im 6/70]
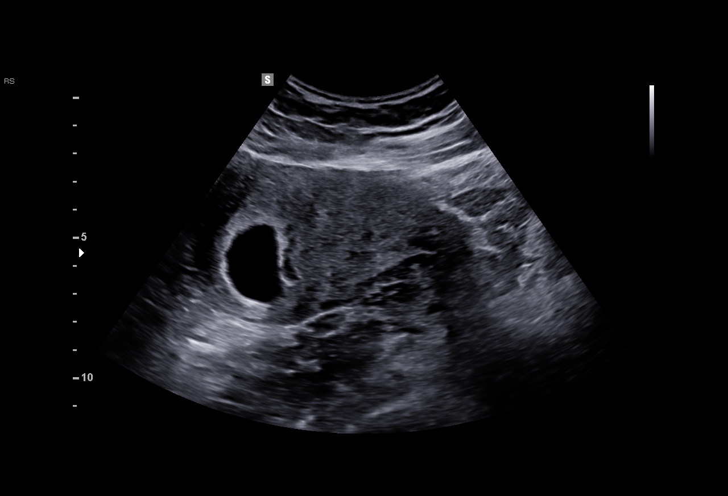
[im 11/70]
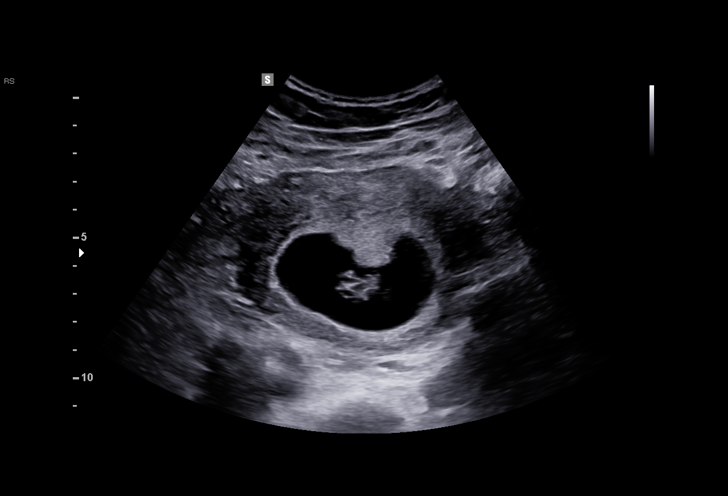
[im 16/70]
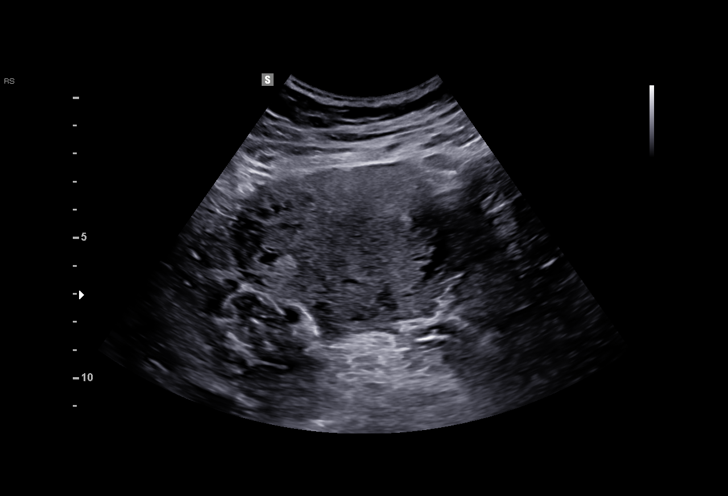
[im 21/70]
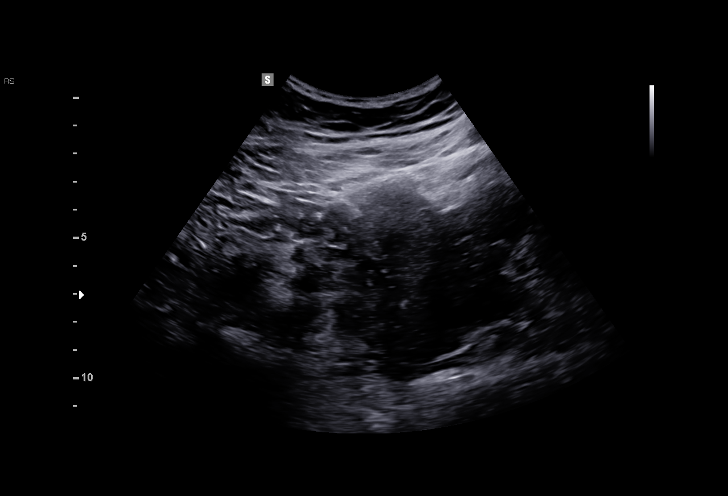
[im 26/70]
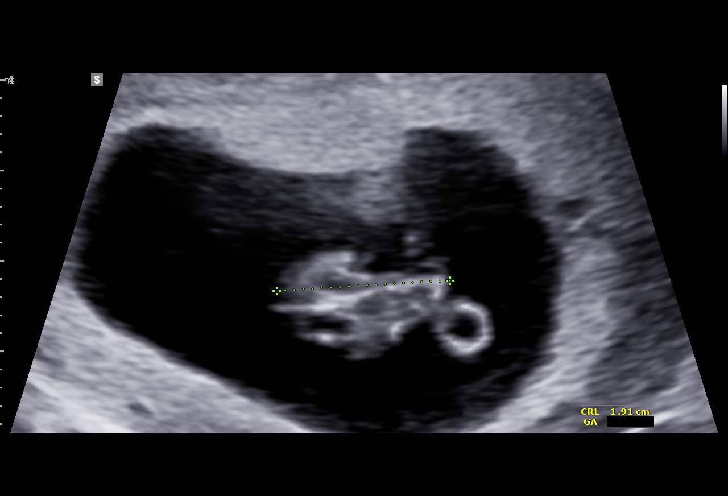
[im 31/70]
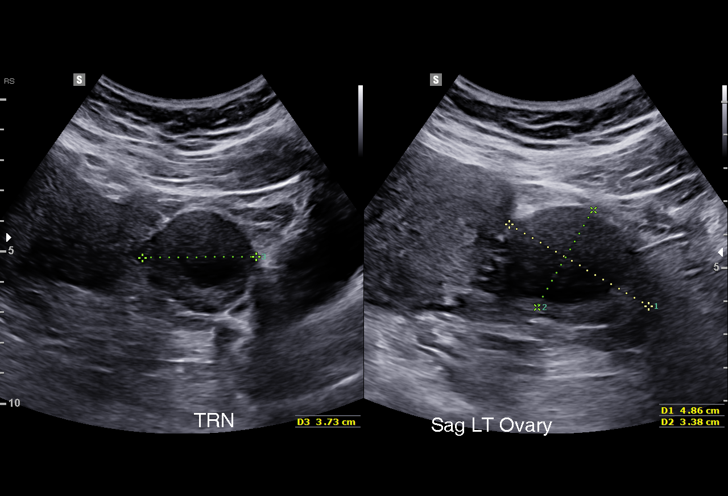
[im 36/70]
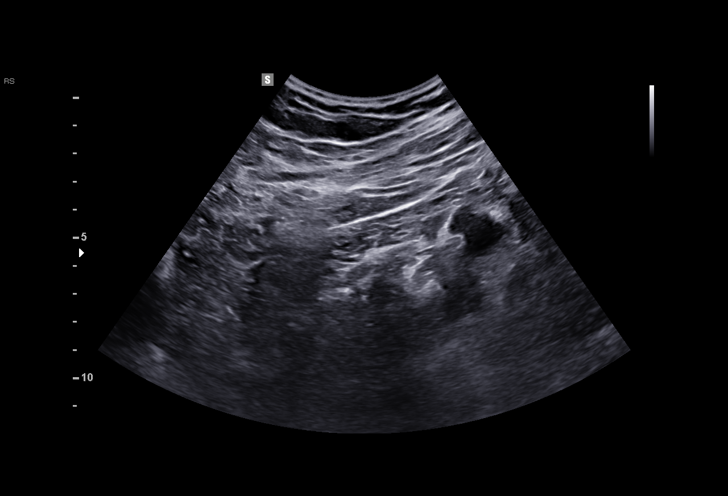
[im 39/70]
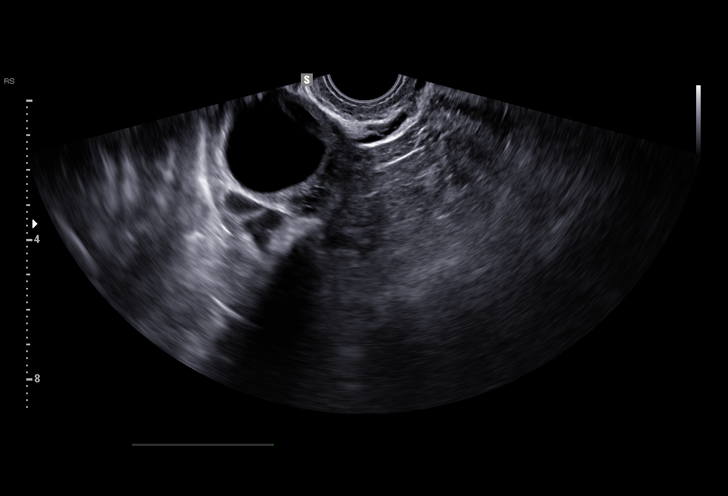
[im 44/70]
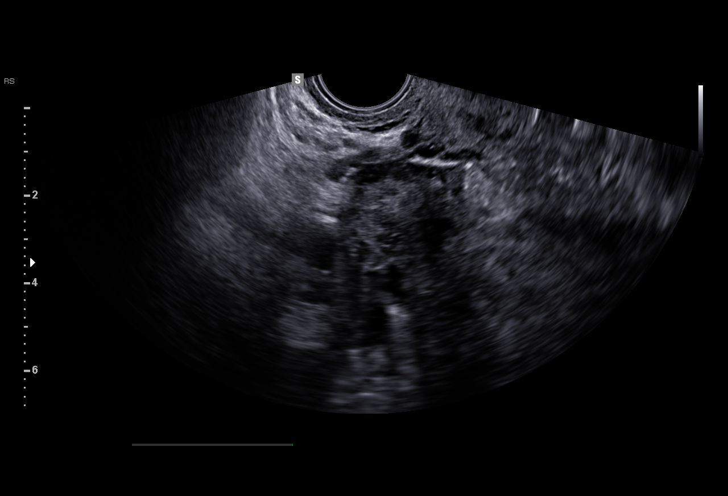
[im 49/70]
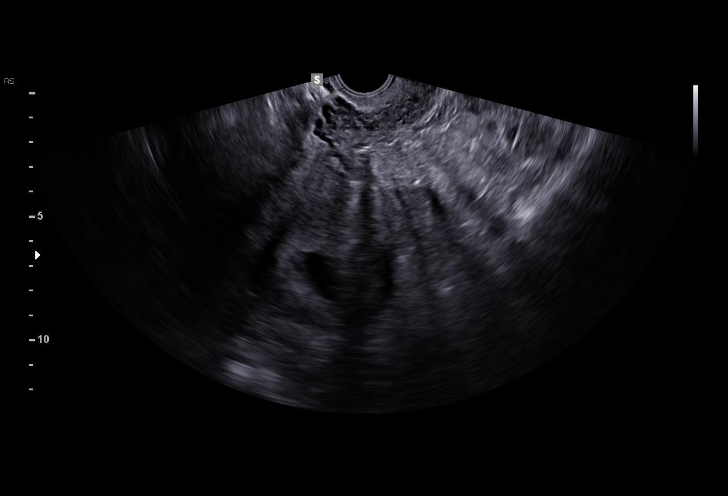
[im 54/70]
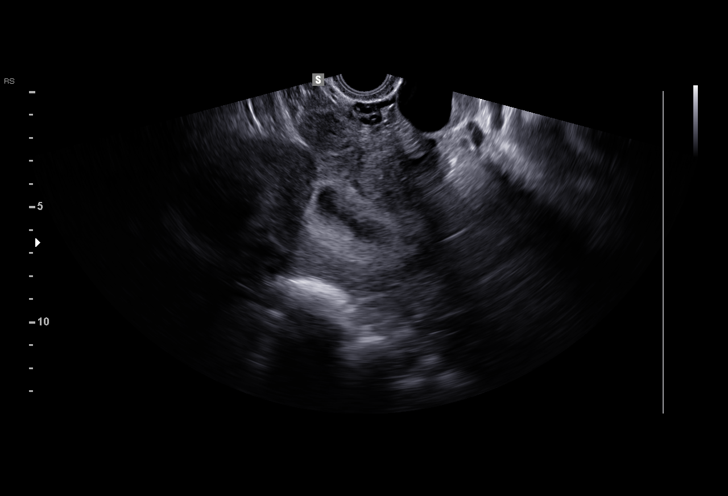
[im 59/70]
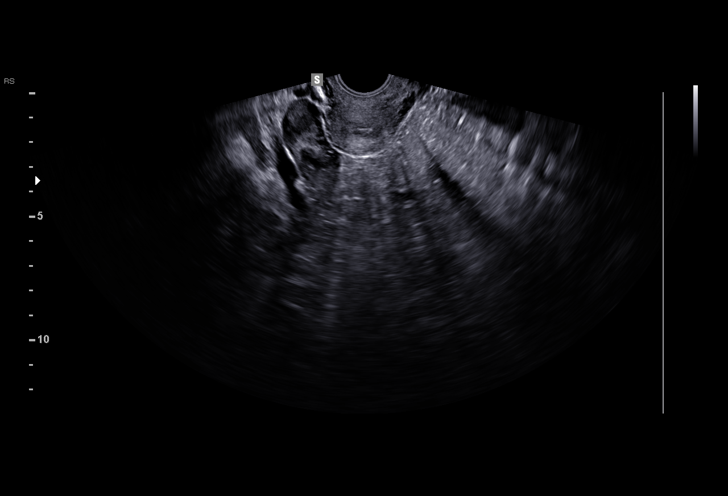
[im 64/70]
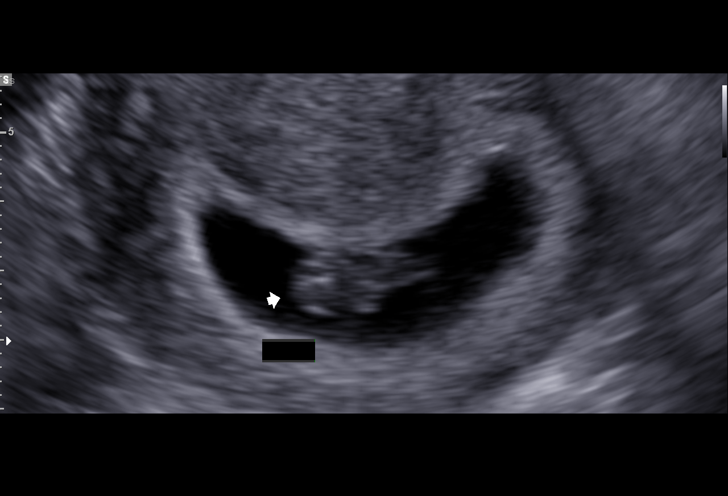
[im 70/70]
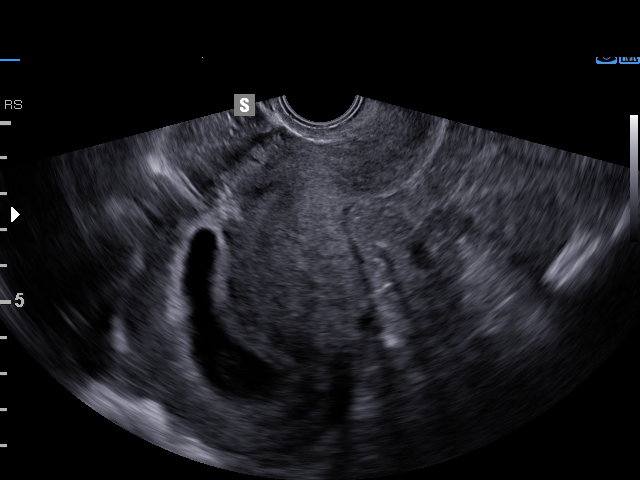

[15 of 28 positions shown; findings below may reference images not displayed]

FINDINGS: Intrauterine gestational sac: Single visualized within normal.

Yolk sac:  Visualized.

Embryo:  Visualized.

Cardiac Activity: Visualized.

Heart Rate: 175 bpm

CRL:   20.1  mm   8 w 4 d                  US EDC: 03/22/2015

Subchorionic hemorrhage: Small subchorionic hemorrhage measuring
x 1.4 x 1.3 cm.

Maternal uterus/adnexae: Ovaries are normal size, shape and
position. Small left ovarian corpus luteal cyst. No free pelvic
fluid.
IMPRESSION: Single live IUP with estimated gestational age 8 weeks 4 days. Small
subchorionic hemorrhage. Recommend follow-up ultrasound as
clinically needed.

## 2018-08-02 IMAGING — US US ABDOMEN LIMITED
1 series · 14 of 25 positions shown · non-contrast
Comparison: Right upper quadrant ultrasound performed 08/01/2015

CLINICAL DATA: Subacute onset of right upper quadrant abdominal
pain and vomiting. Initial encounter.

EXAM:
US ABDOMEN LIMITED - RIGHT UPPER QUADRANT

[Series 1: us abdomen limited · 0.22mm/px · 14 of 35 slices shown]
[im 1/35]
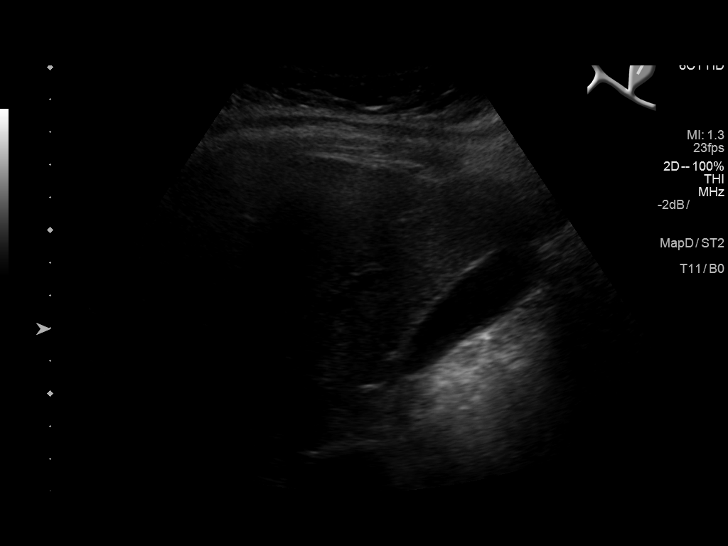
[im 3/35]
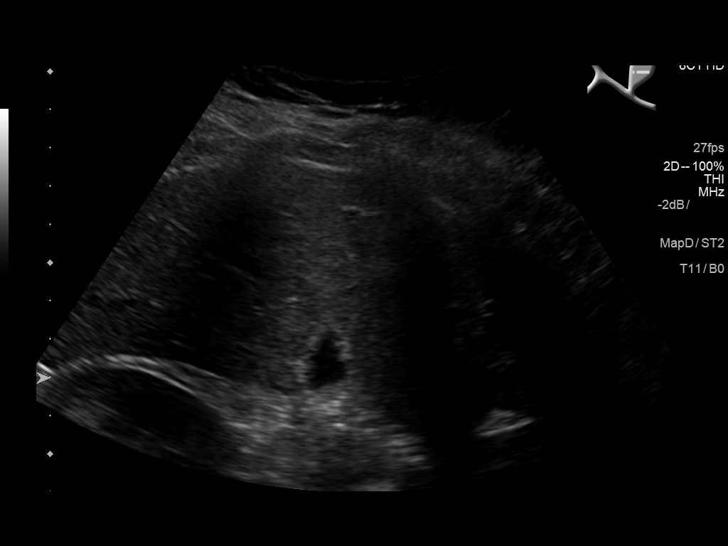
[im 6/35]
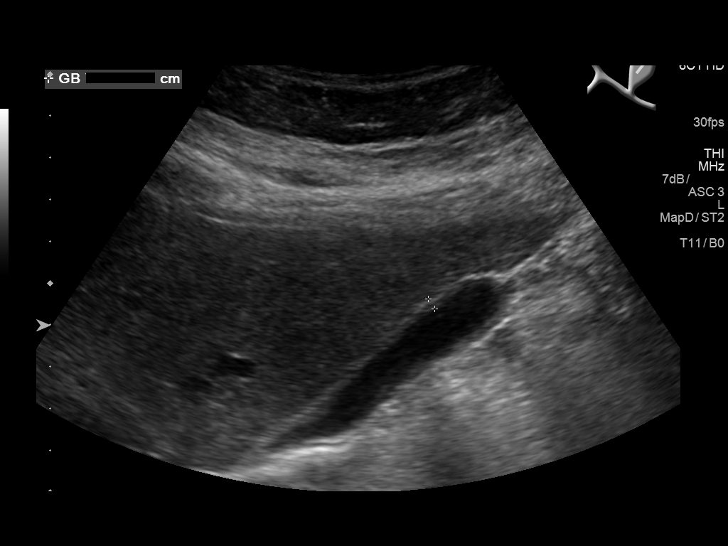
[im 9/35]
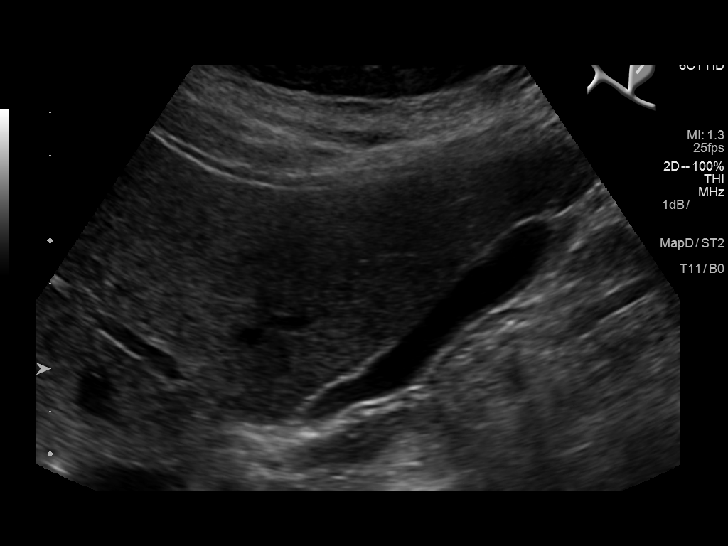
[im 12/35]
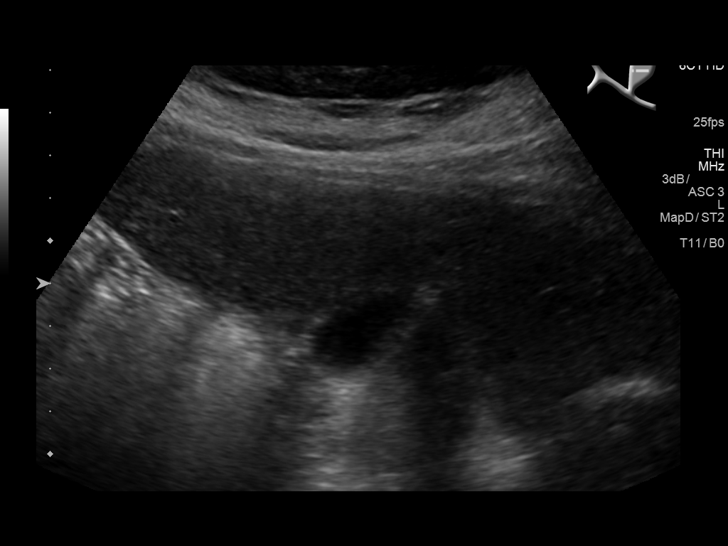
[im 13/35]
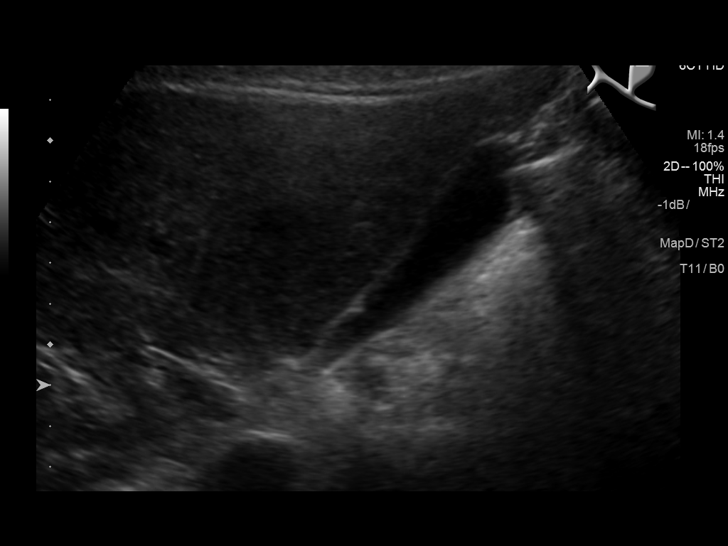
[im 16/35]
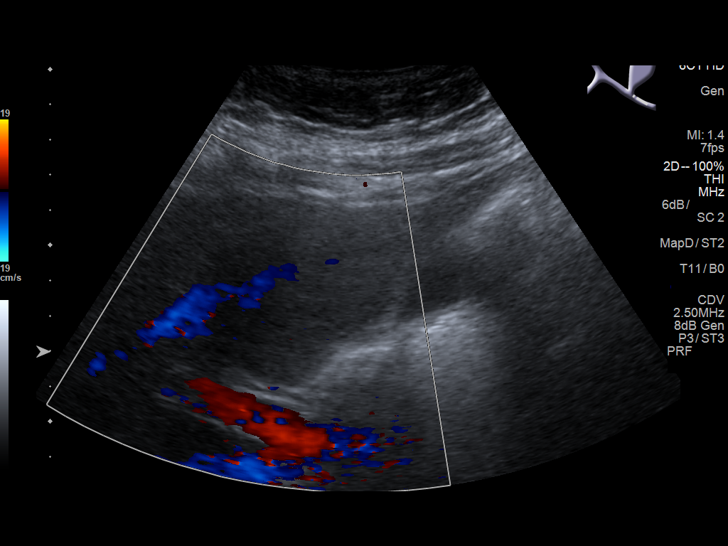
[im 19/35]
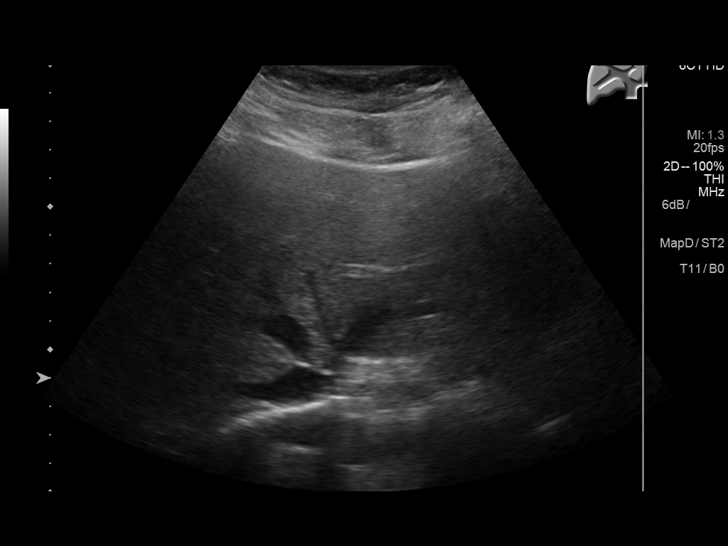
[im 22/35]
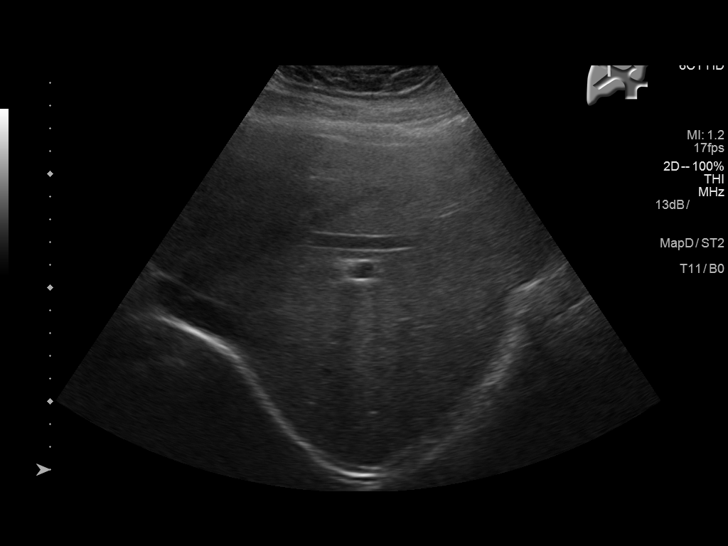
[im 23/35]
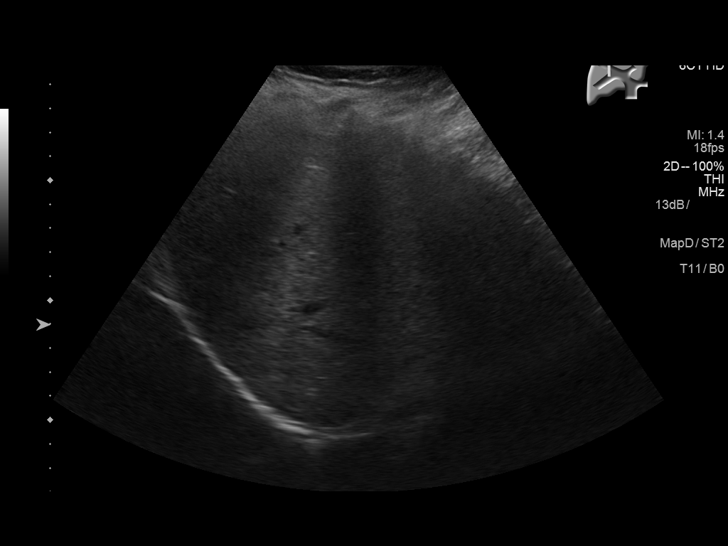
[im 26/35]
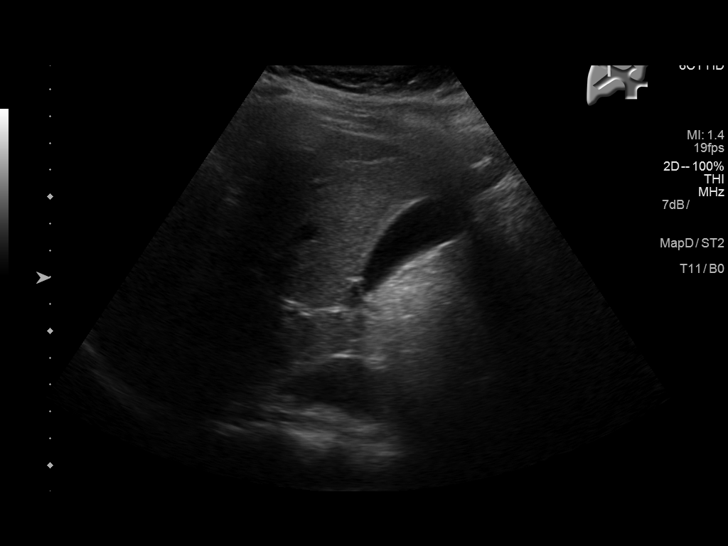
[im 29/35]
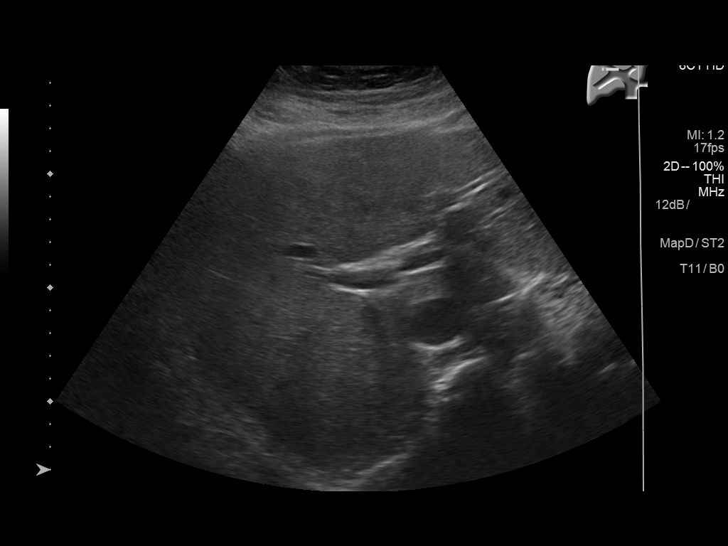
[im 32/35]
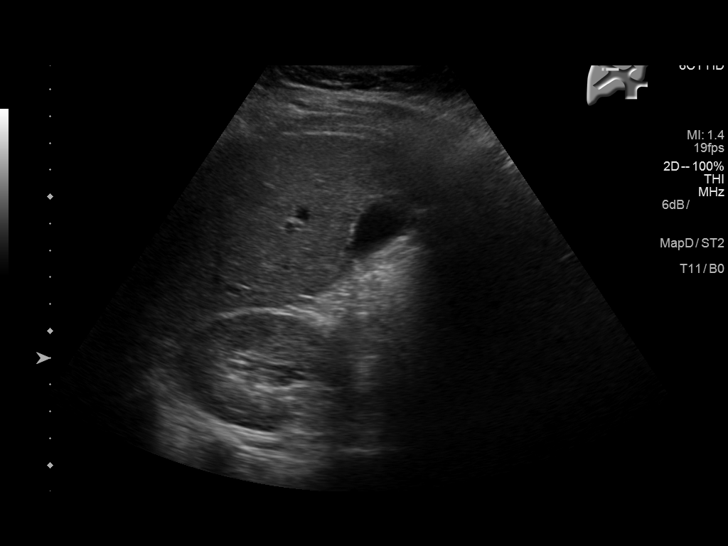
[im 35/35]
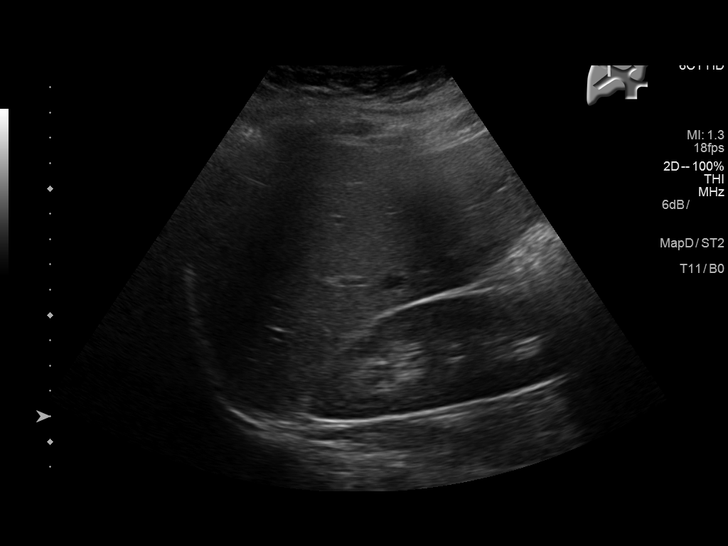

[14 of 25 positions shown; findings below may reference images not displayed]

FINDINGS: Gallbladder:

No gallstones or wall thickening visualized. No sonographic Murphy
sign noted by sonographer.

Common bile duct:

Diameter: 0.3 cm, within normal limits in caliber.

Liver:

No focal lesion identified. Within normal limits in parenchymal
echogenicity.
IMPRESSION: Unremarkable ultrasound of the right upper quadrant.

## 2018-08-18 ENCOUNTER — Emergency Department
Admission: EM | Admit: 2018-08-18 | Discharge: 2018-08-18 | Disposition: A | Discharge status: Home / Self Care | Payer: Medicaid Other | Attending: Emergency Medicine | Admitting: Emergency Medicine

## 2018-08-18 ENCOUNTER — Emergency Department: Payer: Medicaid Other

## 2018-08-18 ENCOUNTER — Other Ambulatory Visit: Payer: Self-pay

## 2018-08-18 ENCOUNTER — Encounter: Payer: Self-pay | Admitting: Emergency Medicine

## 2018-08-18 DIAGNOSIS — Y929 Unspecified place or not applicable: Secondary | ICD-10-CM | POA: Insufficient documentation

## 2018-08-18 DIAGNOSIS — X500XXA Overexertion from strenuous movement or load, initial encounter: Secondary | ICD-10-CM | POA: Insufficient documentation

## 2018-08-18 DIAGNOSIS — S8392XA Sprain of unspecified site of left knee, initial encounter: Secondary | ICD-10-CM | POA: Insufficient documentation

## 2018-08-18 DIAGNOSIS — Y939 Activity, unspecified: Secondary | ICD-10-CM | POA: Insufficient documentation

## 2018-08-18 DIAGNOSIS — J45909 Unspecified asthma, uncomplicated: Secondary | ICD-10-CM | POA: Insufficient documentation

## 2018-08-18 DIAGNOSIS — Y999 Unspecified external cause status: Secondary | ICD-10-CM | POA: Insufficient documentation

## 2018-08-18 DIAGNOSIS — Y93B9 Activity, other involving muscle strengthening exercises: Secondary | ICD-10-CM | POA: Insufficient documentation

## 2018-08-18 MED ORDER — PREDNISONE 20 MG PO TABS: 20.0000 mg | ORAL_TABLET | Freq: Two times a day (BID) | ORAL | 0 refills | Status: AC

## 2018-08-18 MED ORDER — PREDNISONE 20 MG PO TABS
60.0000 mg | ORAL_TABLET | Freq: Once | ORAL | Status: AC
  Administered 2018-08-18: 60 mg via ORAL
  Filled 2018-08-18: qty 3

## 2018-08-18 MED ORDER — CYCLOBENZAPRINE HCL 5 MG PO TABS: 5.0000 mg | ORAL_TABLET | Freq: Three times a day (TID) | ORAL | 0 refills | Status: AC | PRN

## 2018-08-18 NOTE — ED Triage Notes (Signed)
Pt here for left knee pain and swelling. Started after kick boxing class about week ago.  Pain has been getting worse. ambulatory to check in desk.  No fall. Pt thinks she pulled something.

## 2018-08-18 NOTE — ED Provider Notes (Signed)
[facility] Emergency Department Provider Note ____________________________________________  Time seen: [id number]  I have reviewed the triage vital signs and the nursing notes.  HISTORY  Chief Complaint  Knee Pain  HPI Janet [name] is a 33 y.o. female presents also to the ED for evaluation of left knee pain and swelling.  Patient describes the onset of symptoms after kickboxing class about a week ago.  She denies any particular injury or trauma, but has had increasing pain, stiffness, and disability to the knee.  She denies any history of chronic ongoing knee pain.  She denies any distal paresthesias, catching, clicking, locking, or give way to the leg.  She has taken Tylenol with limited benefit.  Past Medical History:  Diagnosis Date  . Anemia   . Asthma   . Depression   . Gestational diabetes mellitus, antepartum 08/12/2016   Moved pt to BRX GDM program Growth US at 38 week EFW 7#13oz, 82nd%  . Headache    migraines  . History of cesarean delivery 04/24/2016   Twins, breech, 169m and pt not told she needs rpt. Pt told she's eligible for TOLAC; she desires RCS.     Patient Active Problem List   Diagnosis Date Noted  . Chronic bilateral low back pain without sciatica 11/26/2017  . Right wrist pain 11/26/2017  . Finger pain, left 11/26/2017  . Finger deformity, acquired, right 02/27/2017  . 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 Benign paroxysmal positional vertigo 02/14/2017  . Chest pain, muscular 02/14/2017  . Health care maintenance 02/14/2017  . Asthma 07/24/2016  . Obesity, Class II, BMI 35-39.9 06/27/2016  . Migraine 06/12/2016  . Sickle cell trait (HCC) 05/15/2016  . History of gestational hypertension 04/24/2016    Past Surgical History:  Procedure Laterality Date  . CESAREAN SECTION    . CESAREAN SECTION N/A 10/17/2016   Procedure: REPEAT CESAREAN SECTION;  Surgeon: Adam PhenixArnold, James G, MD;  [facility];  Service: Obstetrics;  Laterality: N/A;  .  DILATION AND CURETTAGE OF UTERUS      Prior to Admission medications   Medication Sig Start Date End Date Taking? Authorizing Provider  albuterol (PROVENTIL HFA;VENTOLIN HFA) 108 (90 Base) MCG/ACT inhaler Inhale 2 puffs into the lungs every 6 (six) hours as needed for wheezing or shortness of breath. Patient not taking: Reported on 08/18/2018 07/24/16   Tereso NewcomerAnyanwu, Ugonna A, MD  calcium carbonate (TUMS - DOSED IN MG ELEMENTAL CALCIUM) 500 MG chewable tablet Chew 2 tablets by mouth daily as needed for indigestion or heartburn.    [provider]  cyclobenzaprine (FLEXERIL) 5 MG tablet Take 1 tablet (5 mg total) by mouth 3 (three) times daily as needed for up to 5 days. 08/18/18 08/23/18  Jammy Plotkin, Charlesetta IvoryJenise V Bacon, PA-C  diclofenac sodium (VOLTAREN) 1 % GEL Apply 4 g topically 4 (four) times daily. Patient not taking: Reported on 08/18/2018 11/24/17   Myrene BuddyFletcher, Jacob, MD  gabapentin (NEURONTIN) 100 MG capsule Take 1 capsule (100 mg total) by mouth 3 (three) times daily. Patient not taking: Reported on 08/18/2018 11/24/17   Myrene BuddyFletcher, Jacob, MD  meclizine (ANTIVERT) 25 MG tablet Take 1 tablet (25 mg total) by mouth 3 (three) times daily as needed. Patient not taking: Reported on 11/19/2017 02/16/17   Myrene BuddyFletcher, Jacob, MD  montelukast (SINGULAIR) 10 MG tablet Take 1 tablet (10 mg total) by mouth at bedtime. Patient not taking: Reported on 11/19/2017 07/24/16   Tereso NewcomerAnyanwu, Ugonna A, MD  predniSONE (DELTASONE) 20 MG tablet Take 1 tablet (  20 mg total) by mouth 2 (two) times daily with a meal for 5 days. 08/18/18 08/23/18  Emmily Pellegrin, Charlesetta IvoryJenise V Bacon, PA-C  SUMAtriptan (IMITREX) 50 MG tablet Take 1 tablet (50 mg total) by mouth every 2 (two) hours as needed for migraine. May repeat in 2 hours if headache persists or recurs. Patient not taking: Reported on 08/18/2018 11/24/17   Myrene BuddyFletcher, Jacob, MD  verapamil (CALAN) 40 MG tablet TAKE 1 TABLET (40 MG TOTAL) BY MOUTH 3 (THREE) TIMES DAILY. Patient not taking: Reported on 11/19/2017  05/27/17   Shirley, [place], DO    Allergies Flagyl [metronidazole], Nsaids, Zofran [ondansetron hcl], and Levaquin [levofloxacin in d5w]  Family History  Problem Relation Age of Onset  . Hypertension Mother   . Heart disease Mother   . Diabetes Father   . Cancer Maternal Aunt        Breast    Social History Social History   Tobacco Use  . Smoking status: Never Smoker  . Smokeless tobacco: Never Used  Substance Use Topics  . Alcohol use: No  . Drug use: No    Review of Systems  Constitutional: Negative for fever. Cardiovascular: Negative for chest pain. Respiratory: Negative for shortness of breath.  Musculoskeletal: Negative for back pain.  Left knee pain as above. Skin: Negative for rash. Neurological: Negative for headaches, focal weakness or numbness. ____________________________________________  PHYSICAL EXAM:  VITAL SIGNS: ED Triage Vitals  Enc Vitals Group     BP 08/18/18 1846 (!) 142/88     Pulse Rate 08/18/18 1846 79     Resp 08/18/18 2106 16     Temp 08/18/18 1846 98.4 F (36.9 C)     Temp Source 08/18/18 1846 Oral     SpO2 08/18/18 1846 97 %     Weight 08/18/18 1843 238 lb (108 kg)     Height 08/18/18 1843 5\' 9"  (1.753 m)     Head Circumference --      Peak Flow --      Pain Score 08/18/18 1843 8     Pain Loc --      Pain Edu? --      Excl. in GC? --     Constitutional: Alert and oriented. Well appearing and in no distress. Head: Normocephalic and atraumatic. Eyes: Conjunctivae are normal. Normal extraocular movements Cardiovascular: Normal rate, regular rhythm. Normal distal pulses. Respiratory: Normal respiratory effort.  Musculoskeletal: Left knee without obvious deformity or dislocation.  Patient with a small effusion noted.  She is able demonstrate normal flexion and extension range on exam.  No significant valgus or varus joint stress is elicited.  She is tender palpation to the proximal patella at the quadriceps attachment.  No popliteal  space fullness is noted.  Negative anterior/posterior drawer.  No popliteal space fullness, Achilles or calf tenderness is noted distally.  Nontender with normal range of motion in all extremities.  Neurologic: Mildly antalgic gait without ataxia. Normal speech and language. No gross focal neurologic deficits are appreciated. Skin:  Skin is warm, dry and intact. No rash noted. Psychiatric: Mood and affect are normal. Patient exhibits appropriate insight and judgment. ____________________________________________   RADIOLOGY  DG Left Knee  Mild tricompartmental degenerative changes noted. No acute fracture ____________________________________________  PROCEDURES  Procedures Knee immobilizer Prednisone 60 mg PO ____________________________________________  INITIAL IMPRESSION / ASSESSMENT AND PLAN / ED COURSE  Janet [name] was evaluated in Emergency Department on 08/18/2018 for the symptoms described in the history of present illness. She was evaluated in  the context of the global COVID-19 pandemic, which necessitated consideration that the patient might be at risk for infection with the SARS-CoV-2 virus that causes COVID-19. Institutional protocols and algorithms that pertain to the evaluation of patients at risk for COVID-19 are in a state of rapid change based on information released by regulatory bodies including the CDC and federal and state organizations. These policies and algorithms were followed during the patient's care in the ED.  Patient with ED evaluation of left knee pain and swelling following a exercise class a week prior.  Patient's exam is overall benign reassuring at this time.  She likely has a small knee sprain with a small effusion.  No indication of any significant trauma derangement.  Fracture is also reassuring and shows no acute fracture or dislocation.  Patient is placed in a knee immobilizer for comfort.  She is discharged with prednisone and cyclobenzaprine to take as  directed.  She is also referred to Ortho for ongoing symptom management.  Return precautions have been reviewed. ____________________________________________  FINAL CLINICAL IMPRESSION(S) / ED DIAGNOSES  Final diagnoses:  Sprain of left knee, unspecified ligament, initial encounter      Melvenia Needles, PA-C 08/18/18 2328    Vanessa Bolivia, MD 08/20/18 [phone]

## 2018-08-18 NOTE — ED Notes (Signed)
Pt to the er for pain to the left knee. Pt took a kickboxing class last Wednesday and then the next day began having pain. Pt has been using ice packs at home and a knee brace. Pt states the pain has gotten worse. Pt unable to bear weight or bend the knee without pain. Pt states that when she wears the knee brace there is more stability for her.

## 2018-08-18 NOTE — Discharge Instructions (Addendum)
Your exam and XR shows some mild arthritis. You likely have a sprain to the knee. Take the prescription meds as directed. Follow-up with Ortho for continued problems. Seek primary care at one of the local community clinics.

## 2018-09-27 ENCOUNTER — Other Ambulatory Visit: Payer: Self-pay

## 2018-09-27 DIAGNOSIS — Z20822 Contact with and (suspected) exposure to covid-19: Secondary | ICD-10-CM

## 2018-09-28 LAB — NOVEL CORONAVIRUS, NAA: SARS-CoV-2, NAA: NOT DETECTED

## 2019-02-16 ENCOUNTER — Ambulatory Visit: Payer: Self-pay | Admitting: Family Medicine

## 2019-04-06 ENCOUNTER — Encounter: Payer: Self-pay | Admitting: Family Medicine

## 2019-04-06 ENCOUNTER — Other Ambulatory Visit: Payer: Self-pay

## 2019-04-06 ENCOUNTER — Ambulatory Visit (INDEPENDENT_AMBULATORY_CARE_PROVIDER_SITE_OTHER): Payer: No Typology Code available for payment source | Admitting: Family Medicine

## 2019-04-06 VITALS — BP 104/62 | HR 93 | Temp 97.6°F | Ht 69.0 in | Wt 249.5 lb

## 2019-04-06 DIAGNOSIS — G43109 Migraine with aura, not intractable, without status migrainosus: Secondary | ICD-10-CM

## 2019-04-06 DIAGNOSIS — R232 Flushing: Secondary | ICD-10-CM | POA: Diagnosis not present

## 2019-04-06 DIAGNOSIS — F5104 Psychophysiologic insomnia: Secondary | ICD-10-CM | POA: Insufficient documentation

## 2019-04-06 DIAGNOSIS — N898 Other specified noninflammatory disorders of vagina: Secondary | ICD-10-CM

## 2019-04-06 DIAGNOSIS — F321 Major depressive disorder, single episode, moderate: Secondary | ICD-10-CM | POA: Diagnosis not present

## 2019-04-06 DIAGNOSIS — E669 Obesity, unspecified: Secondary | ICD-10-CM

## 2019-04-06 LAB — CBC
HCT: 39.4 % (ref 36.0–46.0)
Hemoglobin: 13.3 g/dL (ref 12.0–15.0)
MCHC: 33.8 g/dL (ref 30.0–36.0)
MCV: 85.1 fl (ref 78.0–100.0)
Platelets: 217 10*3/uL (ref 150.0–400.0)
RBC: 4.62 Mil/uL (ref 3.87–5.11)
RDW: 13.3 % (ref 11.5–15.5)
WBC: 4.8 10*3/uL (ref 4.0–10.5)

## 2019-04-06 LAB — VITAMIN D 25 HYDROXY (VIT D DEFICIENCY, FRACTURES): VITD: 19.67 ng/mL — ABNORMAL LOW (ref 30.00–100.00)

## 2019-04-06 LAB — COMPREHENSIVE METABOLIC PANEL
ALT: 21 U/L (ref 0–35)
AST: 19 U/L (ref 0–37)
Albumin: 4.4 g/dL (ref 3.5–5.2)
Alkaline Phosphatase: 60 U/L (ref 39–117)
BUN: 14 mg/dL (ref 6–23)
CO2: 22 mEq/L (ref 19–32)
Calcium: 9 mg/dL (ref 8.4–10.5)
Chloride: 110 mEq/L (ref 96–112)
Creatinine, Ser: 0.68 mg/dL (ref 0.40–1.20)
GFR: 119.33 mL/min (ref >60.00)
Glucose, Bld: 115 mg/dL — ABNORMAL HIGH (ref 70–99)
Potassium: 3.8 mEq/L (ref 3.5–5.1)
Sodium: 138 mEq/L (ref 135–145)
Total Bilirubin: 0.6 mg/dL (ref 0.2–1.2)
Total Protein: 7.1 g/dL (ref 6.0–8.3)

## 2019-04-06 LAB — TSH: TSH: 1.49 u[IU]/mL (ref 0.35–4.50)

## 2019-04-06 MED ORDER — SUMATRIPTAN SUCCINATE 50 MG PO TABS: 50.0000 mg | ORAL_TABLET | ORAL | 0 refills | Status: DC | PRN

## 2019-04-06 NOTE — Assessment & Plan Note (Signed)
Pt suspects this is hormonal driven. Will get blood work today. Declined medication at this time. Suggested treating insomnia and therapy. Also provided non-medication approaches. Labs to rule out other causes

## 2019-04-06 NOTE — Progress Notes (Signed)
Subjective:     Janet [name] is a 34 y.o. female presenting for Establish Care (previous PCP Dr Primitivo Gauze ) and Mood swings (severe sweating, weight gain, irregular period (almost 2 years with no period until last month))     HPI  #Mood issues - swings - feeling bad, does not want to be bother - occurring every day - will get irritated and on edge - will also get sweats - seems associated with hot flash - sleep - has been insomnia for a long time -- sleeps from 3am-6am - has not tried anything for sleep - used flexeril in the past for pain w/ some improvement in sleep - does not noticed a difference with the sleep - feels down most days and then will occasionally get worse - does not like taking medication  No known exposure to TB  #vaginal itching - around the lips the vagina - for a few days - no discharge - no rash or irritation - did have symptoms during pregnancy -- given a cream and then a pill which helped   #Migraines - will get migraines 3 times a week  - every 2-3 weeks - at least 6 HA per month  #weight gain - has gained weight with the nexplanon  Review of Systems   Social History   Tobacco Use  Smoking Status Never Smoker  Smokeless Tobacco Never Used        Objective:    BP Readings from Last 3 Encounters:  04/06/19 104/62  08/18/18 128/78  11/24/17 120/75   Wt Readings from Last 3 Encounters:  04/06/19 249 lb 8 oz (113.2 kg)  08/18/18 238 lb (108 kg)  11/24/17 240 lb 12.8 oz (109.2 kg)    BP 104/62   Pulse 93   Temp 97.6 F (36.4 C)   Ht 5\' 9"  (1.753 m)   Wt 249 lb 8 oz (113.2 kg)   LMP 03/07/2019   SpO2 97%   Breastfeeding No   BMI 36.84 kg/m    Physical Exam Constitutional:      General: She is not in acute distress.    Appearance: She is well-developed. She is not diaphoretic.  HENT:     Right Ear: External ear normal.     Left Ear: External ear normal.     Nose: Nose normal.  Eyes:     Conjunctiva/sclera:  Conjunctivae normal.  Neck:     Thyroid: No thyroid mass, thyromegaly or thyroid tenderness.  Cardiovascular:     Rate and Rhythm: Normal rate and regular rhythm.     Heart sounds: No murmur.  Pulmonary:     Effort: Pulmonary effort is normal. No respiratory distress.     Breath sounds: Normal breath sounds. No wheezing.  Musculoskeletal:     Cervical back: Neck supple.  Skin:    General: Skin is warm and dry.     Capillary Refill: Capillary refill takes less than 2 seconds.  Neurological:     Mental Status: She is alert. Mental status is at baseline.  Psychiatric:        Mood and Affect: Mood normal.        Behavior: Behavior normal.           Assessment & Plan:   Problem List Items Addressed This Visit      Cardiovascular and Mediastinum   Migraine - Primary    Notes >6 migraines per month but appear clustered. Will send mychart with information about Riboflavin to try.  Refill of sumatriptan      Relevant Medications   SUMAtriptan (IMITREX) 50 MG tablet   Hot flashes    Lower suspicion for early menopause - just had a period - though with nexplanon these have been suppressed. Will check some labs. Denies hx of TB or exposure. Normal HIV testing in pregnancy 2 years ago.       Relevant Orders   Comprehensive metabolic panel   TSH   CBC     Other   Obesity, Class II, BMI 35-39.9    Pt doing keto diet for weight loss. Will check labs today      Current moderate episode of major depressive disorder without prior episode ([place])    Pt suspects this is hormonal driven. Will get blood work today. Declined medication at this time. Suggested treating insomnia and therapy. Also provided non-medication approaches. Labs to rule out other causes      Relevant Orders   Vitamin D, 25-hydroxy   Psychophysiological insomnia    Encouraged trial of melatonin and sleep hygiene handout. Wonder if poor sleep is contributing to mood issues.        Other Visit Diagnoses     Vaginal itching       Relevant Orders   WET PREP BY MOLECULAR PROBE     Vaginal itching - prior history with yeast infection and this seems consistent. Will get wet prep to rule out BV  Return in about 3 months (around 07/07/2019).  Lesleigh Noe, MD

## 2019-04-06 NOTE — Assessment & Plan Note (Signed)
Notes >6 migraines per month but appear clustered. Will send mychart with information about Riboflavin to try. Refill of sumatriptan

## 2019-04-06 NOTE — Assessment & Plan Note (Signed)
Lower suspicion for early menopause - just had a period - though with nexplanon these have been suppressed. Will check some labs. Denies hx of TB or exposure. Normal HIV testing in pregnancy 2 years ago.

## 2019-04-06 NOTE — Assessment & Plan Note (Signed)
Encouraged trial of melatonin and sleep hygiene handout. Wonder if poor sleep is contributing to mood issues.

## 2019-04-06 NOTE — Assessment & Plan Note (Signed)
Pt doing keto diet for weight loss. Will check labs today

## 2019-04-06 NOTE — Patient Instructions (Signed)
Blood work today Vaginal pathogen test today    How to help anxiety and depression  1) Regular Exercise - walking, jogging, cycling, dancing, strength training - aiming for 150 minutes of exercise a week --> Yoga has been shown in research to reduce depression and anxiety -- with even just one hour long session per week  2)  Begin a Mindfulness/Meditation practice -- this can take a little as 3 minutes and is helpful for all kinds of mood issues -- You can find resources in books -- Or you can download apps like  ---- Headspace App  ---- Calm  ---- Insignt Timer ---- Stop, Breathe & Think  # With each of these Apps - you should decline the "start free trial" offer and as you search through the App should be able to access some of their free content. You can also chose to pay for the content if you find one that works well for you.   # Many of them also offer sleep specific content which may help with insomnia  3) Healthy Diet -- Avoid or decrease Caffeine -- Avoid or decrease Alcohol -- Drink plenty of water, have a balanced diet -- Avoid cigarettes and marijuana (as well as other recreational drugs)  4) Find a therapist  -- [facility] is one option. Call [phone] -- Or you can check out www.psychologytoday.com -- you can read bios of therapists and see if they accept insurance -- Check with your insurance to see if you have coverage and who may take your insurance  Sleep hygiene checklist: 1. Avoid naps during the day 2. Avoid stimulants such as caffeine and nicotine. Avoid bedtime alcohol (it can speed onset of sleep but the body's metabolism can cause awakenings). At least 2 hours before bedtime 3. All forms of exercise help ensure sound sleep - limit vigorous exercise to morning or late afternoon 4. Avoid food too close to bedtime including chocolate (which contains caffeine) 5. Soak up natural light 6. Establish regular bedtime routine. 7. Associate bed  with sleep - avoid TV, computer or phone, reading while in bed. 8. Ensure pleasant, relaxing sleep environment - quiet, dark, cool room.  Good Sleep Hygiene Habits -- Got to bed and wake up within an hour of the same time every day -- Avoid bright screens (from laptop, phone, TV) within at least 30 minutes before bed. The "blue light" supresses the sleep hormone melatonin and the content may stimulate as well -- Maintain a quiet and dark sleep environment (blackout curtains, turn on a fan or white noise to block out disruptive sounds) -- Practicing relaxing activites before bed (taking a shower, reading a book, journaling, meditation app) -- To quiet a busy mind -- consider journaling before bed (jotting down reminders, worry thoughts, as well as positive things like a gratitude list)  Try taking 3-5 mg of melatonin nightly If no improvement, can try zzzquil an over the counter sleep aide If still no improvement and interested in prescription medication return to clinic

## 2019-04-07 ENCOUNTER — Other Ambulatory Visit: Payer: Self-pay | Admitting: Family Medicine

## 2019-04-07 ENCOUNTER — Encounter: Payer: Self-pay | Admitting: Family Medicine

## 2019-04-07 DIAGNOSIS — E559 Vitamin D deficiency, unspecified: Secondary | ICD-10-CM

## 2019-04-07 DIAGNOSIS — B3731 Acute candidiasis of vulva and vagina: Secondary | ICD-10-CM

## 2019-04-07 DIAGNOSIS — B373 Candidiasis of vulva and vagina: Secondary | ICD-10-CM

## 2019-04-07 LAB — WET PREP BY MOLECULAR PROBE
Candida species: DETECTED — AB
Gardnerella vaginalis: NOT DETECTED
MICRO NUMBER:: 10287583
SPECIMEN QUALITY:: ADEQUATE
Trichomonas vaginosis: NOT DETECTED

## 2019-04-07 MED ORDER — VITAMIN D (ERGOCALCIFEROL) 1.25 MG (50000 UNIT) PO CAPS: 50000.0000 [IU] | ORAL_CAPSULE | ORAL | 1 refills | Status: DC

## 2019-04-07 MED ORDER — FLUCONAZOLE 150 MG PO TABS: 150.0000 mg | ORAL_TABLET | Freq: Once | ORAL | 0 refills | Status: AC

## 2019-04-07 NOTE — Progress Notes (Signed)
Low vitamin d Sending replacement

## 2019-04-16 IMAGING — US US MFM OB COMP +14 WKS
1 series · 14 of 28 positions shown · non-contrast
Comparison: none

[Series 1: us mfm ob comp +14 wks · 46 acquisitions, 14 frames shown]
[im 2/46]
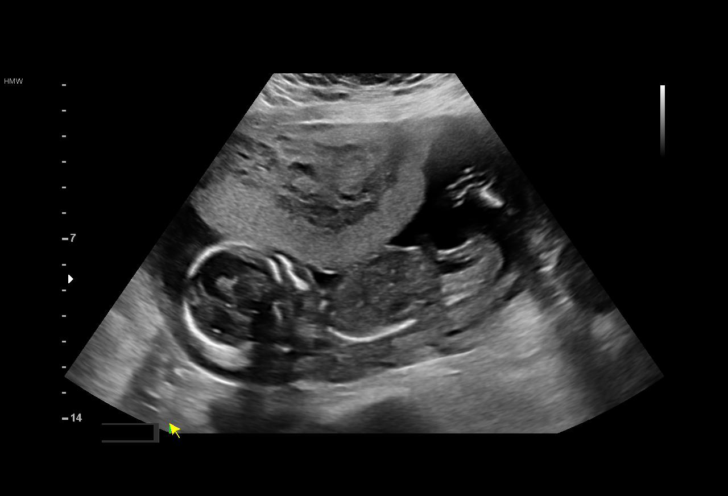
[im 6/46]
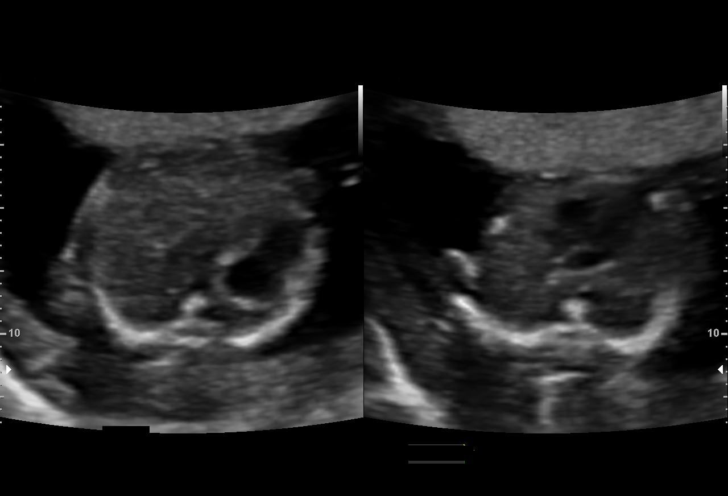
[im 9/46]
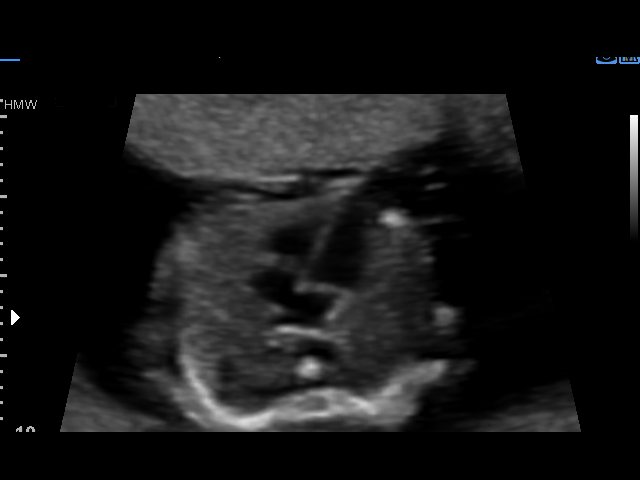
[im 12/46]
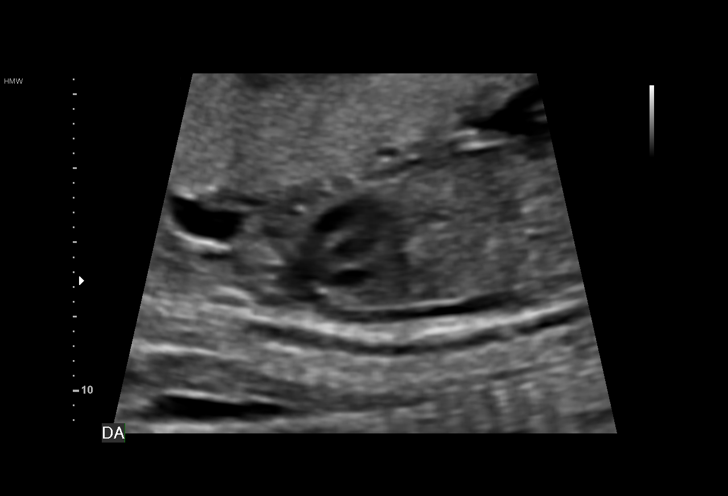
[im 16/46]
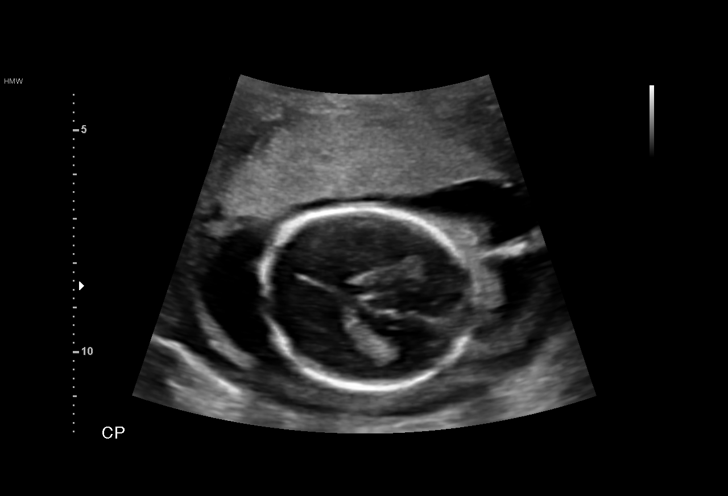
[im 19/46]
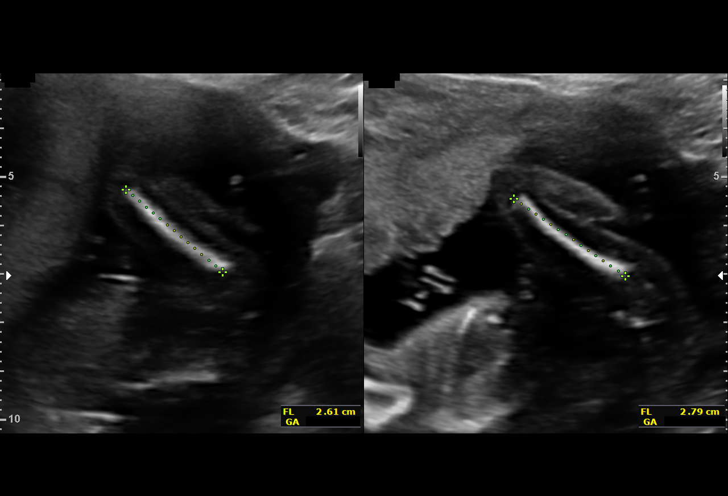
[im 22/46]
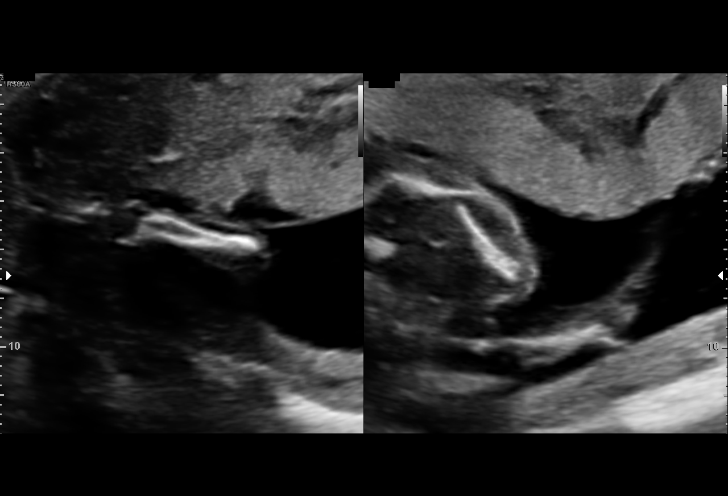
[im 26/46]
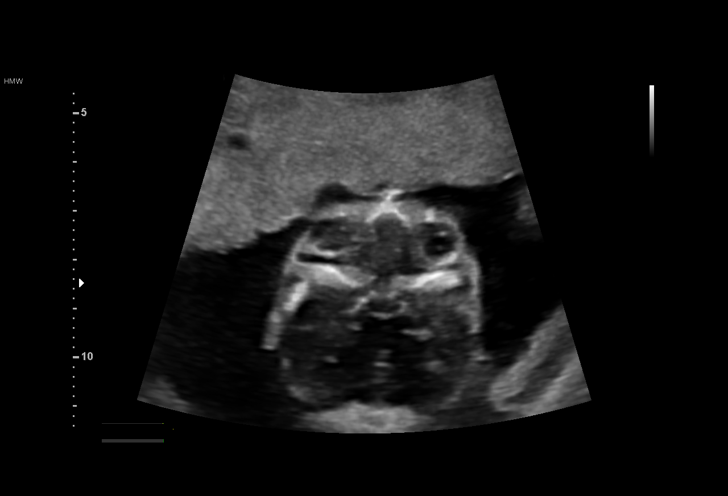
[im 29/46]
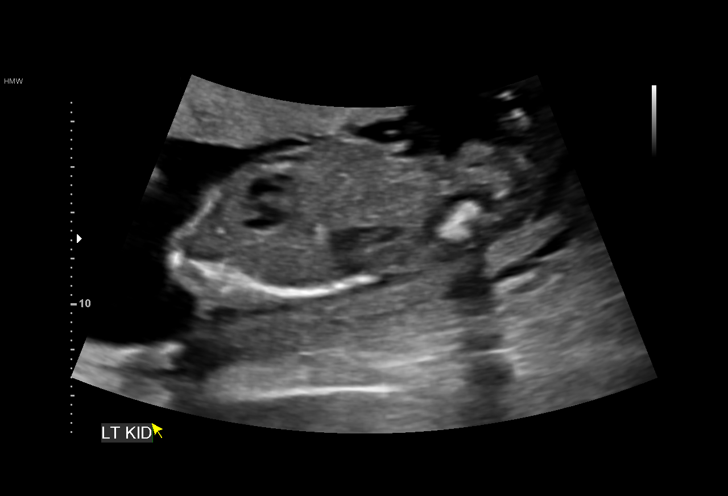
[im 32/46]
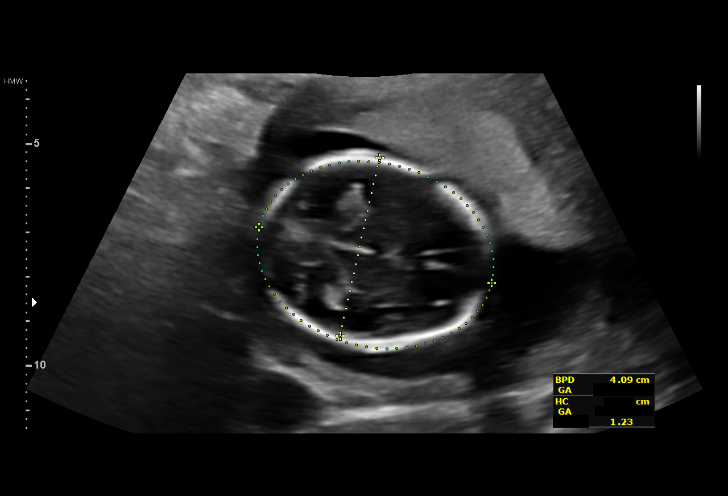
[im 36/46]
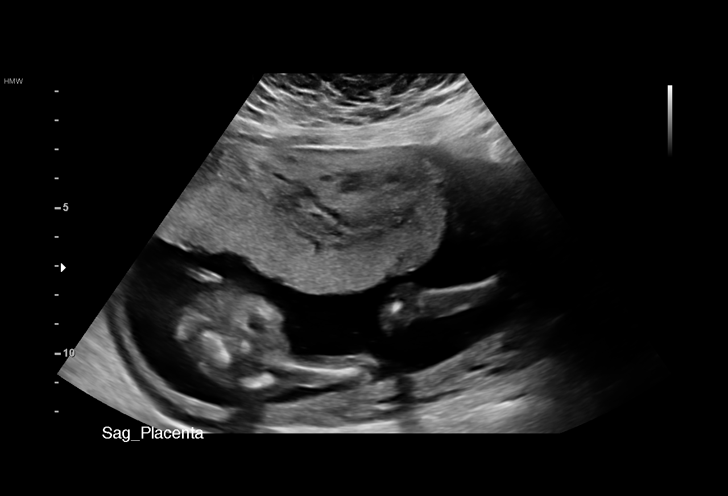
[im 39/46]
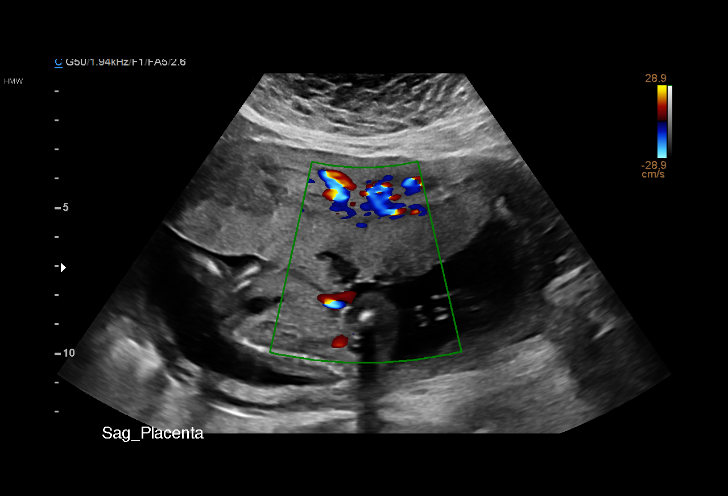
[im 42/46]
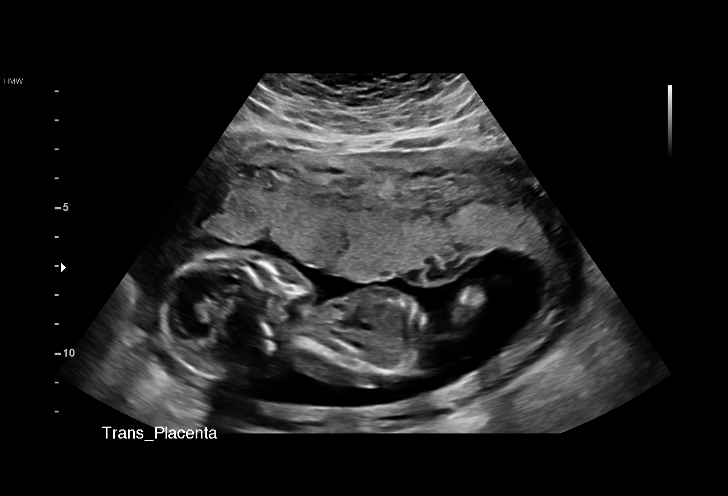
[im 46/46]
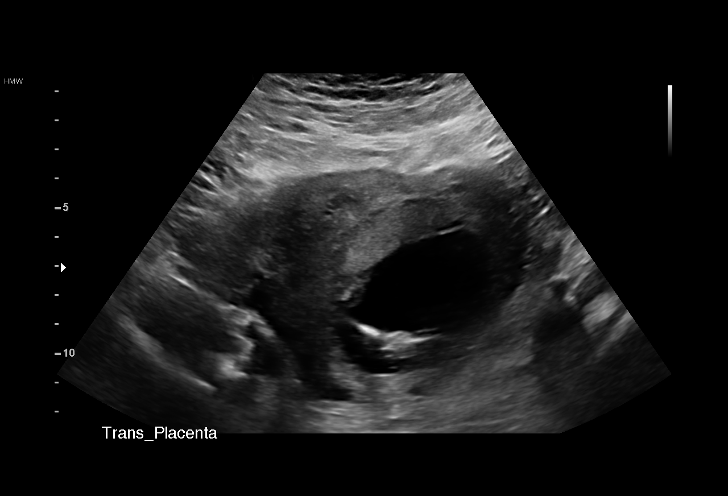

[14 of 28 positions shown; findings below may reference images not displayed]

1  EBRUM YAGBASAN          [id number]      [id number]     [id number]
Indications

18 weeks gestation of pregnancy
Encounter for antenatal screening for
malformations
OB History

Gravidity:    3         Term:   2         SAB:   1
Living:       2
Fetal Evaluation

Num Of Fetuses:     1
Fetal Heart         146
Rate(bpm):
Cardiac Activity:   Observed
Presentation:       Breech
Placenta:           Anterior, above cervical os
P. Cord Insertion:  Visualized, central

Amniotic Fluid
AFI FV:      Subjectively within normal limits

Largest Pocket(cm)
5.3
Biometry

BPD:      40.9  mm     G. Age:  18w 3d         37  %    CI:        75.03   %   70 - 86
FL/HC:      18.0   %   16.1 -
HC:      149.8  mm     G. Age:  18w 1d         15  %    HC/AC:      1.23       1.09 -
AC:      121.3  mm     G. Age:  17w 6d         20  %    FL/BPD:     66.0   %
FL:         27  mm     G. Age:  18w 1d         27  %    FL/AC:      22.3   %   20 - 24
HUM:      27.2  mm     G. Age:  18w 5d         51  %
Est. FW:     221  gm      0 lb 8 oz     32  %
Gestational Age

LMP:           18w 5d       Date:   01/18/16                 EDD:   10/24/16
U/S Today:     18w 1d                                        EDD:   10/28/16
Best:          18w 5d    Det. By:   LMP  (01/18/16)          EDD:   10/24/16
Anatomy

Cranium:               Appears normal         Aortic Arch:            Not well visualized
Cavum:                 Appears normal         Ductal Arch:            Appears normal
Ventricles:            Appears normal         Diaphragm:              Not well visualized
Choroid Plexus:        Appears normal         Stomach:                Appears normal, left
sided
Cerebellum:            Appears normal         Abdomen:                Appears normal
Posterior Fossa:       Appears normal         Abdominal Wall:         Not well visualized
Nuchal Fold:           Appears normal         Cord Vessels:           Appears normal (3
vessel cord)
Face:                  Orbits nl; profile not Kidneys:                Appear normal
well visualized
Lips:                  Not well visualized    Bladder:                Appears normal
Heart:                 Appears normal         Spine:                  Not well visualized
(4CH, axis, and situs
RVOT:                  Not well visualized    Upper Extremities:      Appears normal
LVOT:                  Not well visualized    Lower Extremities:      Appears normal

Other:  Fetus appears to be a female. Nasal bone visualized. Technically
difficult due to maternal habitus and fetal position.
Cervix Uterus Adnexa

Cervix
Length:            3.6  cm.
Normal appearance by transabdominal scan.

Uterus
No abnormality visualized.

Left Ovary
No adnexal mass visualized.

Right Ovary
No adnexal mass visualized.

Cul De Sac:   No free fluid seen.

Adnexa:       No abnormality visualized.
Impression

Singleton intrauterine pregnancy at 18+5 weeks, here for
anatomic survey
Review of the anatomy shows no sonographic markers for
aneuploidy or structural anomalies
However, evaluations  of the cardiac and facial anatomy
should be considered suboptimal secondary to maternal
habitus and fetal position.
Amniotic fluid volume is normal
Estimated fetal weight is 221g which is growth in the 32nd
percentile
Recommendations

Recommend repeat evaluation in 4 weeks to complete
anatomic survey and recheck rate of fetal growth

## 2019-06-15 ENCOUNTER — Ambulatory Visit (INDEPENDENT_AMBULATORY_CARE_PROVIDER_SITE_OTHER): Payer: No Typology Code available for payment source | Admitting: Family Medicine

## 2019-06-15 ENCOUNTER — Encounter: Payer: Self-pay | Admitting: Family Medicine

## 2019-06-15 ENCOUNTER — Other Ambulatory Visit: Payer: Self-pay

## 2019-06-15 VITALS — BP 122/84 | HR 79 | Temp 97.9°F | Ht 69.0 in | Wt 250.0 lb

## 2019-06-15 DIAGNOSIS — F5104 Psychophysiologic insomnia: Secondary | ICD-10-CM | POA: Diagnosis not present

## 2019-06-15 DIAGNOSIS — G43109 Migraine with aura, not intractable, without status migrainosus: Secondary | ICD-10-CM | POA: Diagnosis not present

## 2019-06-15 DIAGNOSIS — G44209 Tension-type headache, unspecified, not intractable: Secondary | ICD-10-CM | POA: Diagnosis not present

## 2019-06-15 MED ORDER — CYCLOBENZAPRINE HCL 5 MG PO TABS: 5.0000 mg | ORAL_TABLET | Freq: Three times a day (TID) | ORAL | 0 refills | Status: DC | PRN

## 2019-06-15 MED ORDER — SUMATRIPTAN SUCCINATE 50 MG PO TABS: 50.0000 mg | ORAL_TABLET | ORAL | 0 refills | Status: DC | PRN

## 2019-06-15 NOTE — Assessment & Plan Note (Signed)
No success with melatonin and this may be playing into her HA. Trial of flexeril for HA and if not improved - call or mychart. Discussed trying Trazodone.

## 2019-06-15 NOTE — Progress Notes (Signed)
Subjective:     Janet [name] is a 34 y.o. female presenting for Hypertension and Headache     HPI  #Headache/HTN - bp 135-140/95-110 - which started on Sunday - HA is posterior head - endorses neck pain - Treatment: Tylenol  - has sumatriptan but ran out and this is different from her typical migraine - endorses feeling hot, has not checked temperature - no sick contact - no one with similar symptoms  Has not been sleepin Has been drinking lots  Review of Systems  Constitutional: Positive for chills. Negative for fever.  HENT: Negative for congestion.   Eyes: Negative for itching.  Respiratory: Negative for cough and shortness of breath.   Cardiovascular: Negative for chest pain.  Gastrointestinal: Negative for diarrhea, nausea and vomiting.  Neurological: Positive for dizziness and headaches.     Social History   Tobacco Use  Smoking Status Never Smoker  Smokeless Tobacco Never Used        Objective:    BP Readings from Last 3 Encounters:  06/15/19 122/84  04/06/19 104/62  08/18/18 128/78   Wt Readings from Last 3 Encounters:  06/15/19 250 lb (113.4 kg)  04/06/19 249 lb 8 oz (113.2 kg)  08/18/18 238 lb (108 kg)    BP 122/84   Pulse 79   Temp 97.9 F (36.6 C) (Temporal)   Ht 5\' 9"  (1.753 m)   Wt 250 lb (113.4 kg)   SpO2 97%   BMI 36.92 kg/m    Physical Exam Constitutional:      General: She is not in acute distress.    Appearance: She is well-developed. She is not diaphoretic.  HENT:     Head: Normocephalic and atraumatic.     Right Ear: External ear normal.     Left Ear: External ear normal.     Nose: Nose normal.     Mouth/Throat:     Mouth: Mucous membranes are moist.  Eyes:     General: No scleral icterus.    Extraocular Movements: Extraocular movements intact.     Conjunctiva/sclera: Conjunctivae normal.     Pupils: Pupils are equal, round, and reactive to light.  Neck:     Meningeal: Brudzinski's sign and Kernig's sign  absent.  Cardiovascular:     Rate and Rhythm: Normal rate and regular rhythm.     Heart sounds: No murmur.  Pulmonary:     Effort: Pulmonary effort is normal. No respiratory distress.     Breath sounds: Normal breath sounds. No wheezing.  Musculoskeletal:        General: Normal range of motion.     Cervical back: Neck supple. No rigidity.  Lymphadenopathy:     Cervical: No cervical adenopathy.  Skin:    General: Skin is warm and dry.     Capillary Refill: Capillary refill takes less than 2 seconds.  Neurological:     Mental Status: She is alert. Mental status is at baseline.     Cranial Nerves: No cranial nerve deficit.  Psychiatric:        Mood and Affect: Mood normal.        Behavior: Behavior normal.           Assessment & Plan:   Problem List Items Addressed This Visit      Cardiovascular and Mediastinum   Migraine   Relevant Medications   cyclobenzaprine (FLEXERIL) 5 MG tablet   SUMAtriptan (IMITREX) 50 MG tablet     Other   Psychophysiological insomnia  No success with melatonin and this may be playing into her HA. Trial of flexeril for HA and if not improved - call or mychart. Discussed trying Trazodone.       Tension headache - Primary    No rigidity on exam, but some neck tension. Allergy to NSAIDs so will try flexeril for HA relief. Reassurance that BP is normal in the office. Information for home monitoring provided.       Relevant Medications   cyclobenzaprine (FLEXERIL) 5 MG tablet   SUMAtriptan (IMITREX) 50 MG tablet       Return if symptoms worsen or fail to improve.  Lesleigh Noe, MD

## 2019-06-15 NOTE — Patient Instructions (Signed)
Headache - Try Flexeril - may make you sleepy - Continue to drink water - also try heat or ice to the neck or head  To check your blood pressure 1) Sit in a quiet and relaxed place for 5 minutes 2) Make sure your feet are flat on the ground 3) Consider checking first thing in the morning   Normal blood pressure is less than 140/90 Ideally you blood pressure should be around 120/80

## 2019-06-15 NOTE — Assessment & Plan Note (Signed)
No rigidity on exam, but some neck tension. Allergy to NSAIDs so will try flexeril for HA relief. Reassurance that BP is normal in the office. Information for home monitoring provided.

## 2019-06-19 ENCOUNTER — Other Ambulatory Visit: Payer: Self-pay | Admitting: Family Medicine

## 2019-06-23 ENCOUNTER — Encounter: Payer: Self-pay | Admitting: Emergency Medicine

## 2019-06-23 ENCOUNTER — Emergency Department: Payer: No Typology Code available for payment source

## 2019-06-23 ENCOUNTER — Other Ambulatory Visit: Payer: Self-pay

## 2019-06-23 ENCOUNTER — Emergency Department
Admission: EM | Admit: 2019-06-23 | Discharge: 2019-06-23 | Disposition: A | Discharge status: Home / Self Care | Payer: No Typology Code available for payment source | Attending: Emergency Medicine | Admitting: Emergency Medicine

## 2019-06-23 DIAGNOSIS — J45909 Unspecified asthma, uncomplicated: Secondary | ICD-10-CM | POA: Insufficient documentation

## 2019-06-23 DIAGNOSIS — Z79899 Other long term (current) drug therapy: Secondary | ICD-10-CM | POA: Insufficient documentation

## 2019-06-23 DIAGNOSIS — M5441 Lumbago with sciatica, right side: Secondary | ICD-10-CM | POA: Insufficient documentation

## 2019-06-23 DIAGNOSIS — M545 Low back pain: Secondary | ICD-10-CM | POA: Diagnosis present

## 2019-06-23 DIAGNOSIS — M5431 Sciatica, right side: Secondary | ICD-10-CM

## 2019-06-23 MED ORDER — OXYCODONE HCL 5 MG PO TABS
5.0000 mg | ORAL_TABLET | Freq: Once | ORAL | Status: AC
  Administered 2019-06-23: 5 mg via ORAL
  Filled 2019-06-23: qty 1

## 2019-06-23 MED ORDER — PREDNISONE 10 MG (21) PO TBPK: ORAL_TABLET | ORAL | 0 refills | Status: DC

## 2019-06-23 MED ORDER — ORPHENADRINE CITRATE 30 MG/ML IJ SOLN
60.0000 mg | Freq: Two times a day (BID) | INTRAMUSCULAR | Status: DC
  Administered 2019-06-23: 60 mg via INTRAMUSCULAR
  Filled 2019-06-23: qty 2

## 2019-06-23 MED ORDER — CYCLOBENZAPRINE HCL 10 MG PO TABS: 5.0000 mg | ORAL_TABLET | Freq: Three times a day (TID) | ORAL | 0 refills | Status: DC | PRN

## 2019-06-23 MED ORDER — OXYCODONE-ACETAMINOPHEN 5-325 MG PO TABS: 1.0000 | ORAL_TABLET | Freq: Four times a day (QID) | ORAL | 0 refills | Status: DC | PRN

## 2019-06-23 NOTE — ED Provider Notes (Signed)
[facility] Emergency Department Provider Note ____________________________________________  Time seen: Approximately 3:30 PM  I have reviewed the triage vital signs and the nursing notes.  HISTORY  Chief Complaint Back Pain   HPI Janet [name] is a 34 y.o. female who presents to the emergency department for treatment and evaluation of right-sided low back pain.  Pain started 3 to 4 days ago.  No injury that she can recall.  Pain increases with movement.  Pain increases with sitting.  She has noticed some radiation of the pain from her low back into her right lower extremity.  No relief with ice, heat, Tylenol, or oxycodone.  Past Medical History:  Diagnosis Date   Anemia    Asthma    Depression    Gestational diabetes mellitus, antepartum 08/12/2016   Moved pt to BRX GDM program Growth [place] at 38 week EFW 7#13oz, 82nd%   Headache    migraines   History of cesarean delivery 04/24/2016   Twins, breech, 30m and pt not told she needs rpt. Pt told she's eligible for TOLAC; she desires RCS.     Patient Active Problem List   Diagnosis Date Noted   Tension headache 06/15/2019   Hot flashes 04/06/2019   Current moderate episode of major depressive disorder without prior episode (HCC) 04/06/2019   Psychophysiological insomnia 04/06/2019   Chronic bilateral low back pain without sciatica 11/26/2017   Right wrist pain 11/26/2017   Finger pain, left 11/26/2017   Contracture of finger joint, 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   Finger deformity, acquired, right 02/27/2017   Fat pad 02/27/2017   Benign paroxysmal positional vertigo 02/14/2017   Asthma 07/24/2016   Obesity, Class II, BMI 35-39.9 06/27/2016   Migraine 06/12/2016   Sickle cell trait (HCC) 05/15/2016   History of gestational hypertension 04/24/2016    Past Surgical History:  Procedure Laterality Date   CESAREAN SECTION     CESAREAN SECTION N/A 10/17/2016   Procedure: REPEAT CESAREAN SECTION;   Surgeon: Adam Phenix, MD;  Location: [facility] BIRTHING SUITES;  Service: Obstetrics;  Laterality: N/A;   DILATION AND CURETTAGE OF UTERUS      Prior to Admission medications   Medication Sig Start Date End Date Taking? Authorizing Provider  albuterol (PROVENTIL HFA;VENTOLIN HFA) 108 (90 Base) MCG/ACT inhaler Inhale 2 puffs into the lungs every 6 (six) hours as needed for wheezing or shortness of breath. 07/24/16   Anyanwu, Jethro Bastos, MD  calcium carbonate (TUMS - DOSED IN MG ELEMENTAL CALCIUM) 500 MG chewable tablet Chew 2 tablets by mouth daily as needed for indigestion or heartburn.    [provider]  cyclobenzaprine (FLEXERIL) 10 MG tablet Take 0.5 tablets (5 mg total) by mouth 3 (three) times daily as needed for muscle spasms. 06/23/19   Codey Burling B, FNP  diclofenac Sodium (VOLTAREN) 1 % GEL APPLY 4 GRAMS TOPICALLY 4 TIMES A DAY. PA REQUIRED 06/20/19   Lynnda Child, MD  Etonogestrel [facility]) Inject into the skin.    [provider]  oxyCODONE-acetaminophen (PERCOCET) 5-325 MG tablet Take 1 tablet by mouth every 6 (six) hours as needed. 06/23/19 06/22/20  Estoria Geary, Kasandra Knudsen, FNP  predniSONE (STERAPRED UNI-PAK 21 TAB) 10 MG (21) TBPK tablet Take 6 tablets on the first day and decrease by 1 tablet each day until finished. 06/23/19   Namya Voges, Rulon Eisenmenger B, FNP  SUMAtriptan (IMITREX) 50 MG tablet Take 1 tablet (50 mg total) by mouth every 2 (two) hours as needed for migraine. May repeat in  2 hours if headache persists or recurs. 06/15/19   Lesleigh Noe, MD  Vitamin D, Ergocalciferol, (DRISDOL) 1.25 MG (50000 UNIT) CAPS capsule Take 1 capsule (50,000 Units total) by mouth every 7 (seven) days. 04/07/19   Lesleigh Noe, MD    Allergies Flagyl [metronidazole], Ibuprofen, Nsaids, Zofran [ondansetron hcl], and Levaquin [levofloxacin in d5w]  Family History  Problem Relation Age of Onset   Hypertension Mother    Heart disease Mother    Diabetes Father    Breast cancer  Maternal Aunt 70    Social History Social History   Tobacco Use   Smoking status: Never Smoker   Smokeless tobacco: Never Used  Substance Use Topics   Alcohol use: Yes    Comment: once every few months   Drug use: No    Review of Systems Constitutional: Well appearing. Respiratory: Negative for dyspnea. Cardiovascular: Negative for change in skin temperature or color. Musculoskeletal:   Negative for chronic steroid use   Negative for trauma in the presence of osteoporosis  Negative for age over 14 and trauma.  Negative for constitutional symptoms, or history of cancer   Negative for pain worse at night. Skin: Negative for rash, lesion, or wound.  Genitourinary: Negative for urinary retention. Rectal: Negative for fecal incontinence or new onset constipation/bowel habit changes. Hematological/Immunilogical: Negative for immunosuppression, IV drug use, or fever Neurological: Positive for burning, tingling, numb, electric, radiating pain in the right lower extremity.                        Negative for saddle anesthesia.                        Negative for focal neurologic deficit, progressive or disabling symptoms             Negative for saddle anesthesia. ____________________________________________   PHYSICAL EXAM:  VITAL SIGNS: ED Triage Vitals  Enc Vitals Group     BP 06/23/19 1426 (!) 113/53     Pulse Rate 06/23/19 1426 82     Resp 06/23/19 1426 18     Temp 06/23/19 1426 98.7 F (37.1 C)     Temp Source 06/23/19 1426 Oral     SpO2 06/23/19 1426 98 %     Weight 06/23/19 1426 250 lb (113.4 kg)     Height 06/23/19 1426 5\' 9"  (1.753 m)     Head Circumference --      Peak Flow --      Pain Score 06/23/19 1429 10     Pain Loc --      Pain Edu? --      Excl. in [place]? --     Constitutional: Alert and oriented. Well appearing and in no acute distress. Eyes: Conjunctivae are clear without discharge or drainage.  Head: Atraumatic. Neck: Full, active range of  motion. Respiratory: Respirations even and unlabored. Musculoskeletal: Full ROM of the back and extremities, Strength 5/5 of the lower extremities as tested. Neurologic: Reflexes of the lower extremities are 2+.  Negative straight leg raise on the right and left side. Skin: Atraumatic.  Psychiatric: Behavior and affect are normal.  ____________________________________________   LABS (all labs ordered are listed, but only abnormal results are displayed)  Labs Reviewed - No data to display ____________________________________________  RADIOLOGY  Image of the lumbar spine is negative for acute findings. ____________________________________________   PROCEDURES  Procedure(s) performed:  Procedures ____________________________________________   INITIAL IMPRESSION /  ASSESSMENT AND PLAN / ED COURSE  Janet [name] is a 34 y.o. female presenting to the emergency department for treatment and evaluation of nontraumatic right-sided low back pain that increases with movement.  See HPI for further details.  Plan will be to obtain an x-ray of the lumbar spine and give her an injection of Norflex and a tablet of OxyIR.  Pain decreased somewhat with Norflex and OxyIR.  Patient appears to be more comfortable.  X-ray of the lumbar spine is negative for acute findings.  Plan will be to discharge her home with prednisone, Flexeril, and Percocet.  She is to see her primary care provider in 1 week if not improving.  She is to return to the emergency department or see primary care sooner if her symptoms change or worsen.  Medications  orphenadrine (NORFLEX) injection 60 mg (60 mg Intramuscular Given 06/23/19 1616)  oxyCODONE (Oxy IR/ROXICODONE) immediate release tablet 5 mg (5 mg Oral Given 06/23/19 1616)    ED Discharge Orders         Ordered    cyclobenzaprine (FLEXERIL) 10 MG tablet  3 times daily PRN     Discontinue  Reprint     06/23/19 1650    oxyCODONE-acetaminophen (PERCOCET) 5-325 MG tablet   Every 6 hours PRN     Discontinue  Reprint     06/23/19 1650    predniSONE (STERAPRED UNI-PAK 21 TAB) 10 MG (21) TBPK tablet     Discontinue  Reprint     06/23/19 1650           Pertinent labs & imaging results that were available during my care of the patient were reviewed by me and considered in my medical decision making (see chart for details).  _________________________________________   FINAL CLINICAL IMPRESSION(S) / ED DIAGNOSES  Final diagnoses:  Sciatica of right side     If controlled substance prescribed during this visit, 12 month history viewed on the [facility] prior to issuing an initial prescription for Schedule II or III opiod.   Chinita Pester, FNP 06/23/19 1658    Shaune Pollack, MD 06/27/19 2224

## 2019-06-23 NOTE — Discharge Instructions (Signed)
Please follow-up with your primary care provider in about a week if you are symptoms are not improving with medication.  Return to the emergency department for symptoms change or worsen if you are unable to schedule appointment.

## 2019-06-23 NOTE — ED Notes (Signed)
See triage note  Presents with lower back pain  States pain started on Monday  Denies any recent injury or urinary sxs   Ambulates slowly d/t pain

## 2019-06-23 NOTE — ED Triage Notes (Signed)
Patient reports lower back pain since Monday. States she has had trouble changing positions and is getting no relief from home interventions. Patient denies any urinary symptoms at this time.

## 2019-07-10 ENCOUNTER — Other Ambulatory Visit: Payer: Self-pay | Admitting: Family Medicine

## 2019-07-10 DIAGNOSIS — E559 Vitamin D deficiency, unspecified: Secondary | ICD-10-CM

## 2019-08-02 ENCOUNTER — Other Ambulatory Visit: Payer: Self-pay | Admitting: Family Medicine

## 2019-08-02 ENCOUNTER — Emergency Department: Payer: No Typology Code available for payment source

## 2019-08-02 ENCOUNTER — Encounter: Payer: Self-pay | Admitting: Emergency Medicine

## 2019-08-02 ENCOUNTER — Emergency Department
Admission: EM | Admit: 2019-08-02 | Discharge: 2019-08-02 | Disposition: A | Discharge status: Home / Self Care | Payer: No Typology Code available for payment source | Attending: Emergency Medicine | Admitting: Emergency Medicine

## 2019-08-02 ENCOUNTER — Other Ambulatory Visit: Payer: Self-pay

## 2019-08-02 DIAGNOSIS — J45909 Unspecified asthma, uncomplicated: Secondary | ICD-10-CM | POA: Diagnosis not present

## 2019-08-02 DIAGNOSIS — L03011 Cellulitis of right finger: Secondary | ICD-10-CM | POA: Insufficient documentation

## 2019-08-02 DIAGNOSIS — Z79899 Other long term (current) drug therapy: Secondary | ICD-10-CM | POA: Insufficient documentation

## 2019-08-02 DIAGNOSIS — M79641 Pain in right hand: Secondary | ICD-10-CM | POA: Diagnosis present

## 2019-08-02 DIAGNOSIS — E559 Vitamin D deficiency, unspecified: Secondary | ICD-10-CM

## 2019-08-02 MED ORDER — TRAMADOL HCL 50 MG PO TABS: 50.0000 mg | ORAL_TABLET | Freq: Four times a day (QID) | ORAL | 0 refills | Status: DC | PRN

## 2019-08-02 MED ORDER — CEPHALEXIN 500 MG PO CAPS
500.0000 mg | ORAL_CAPSULE | Freq: Once | ORAL | Status: AC
  Administered 2019-08-02: 500 mg via ORAL
  Filled 2019-08-02: qty 1

## 2019-08-02 MED ORDER — ACETAMINOPHEN 500 MG PO TABS
1000.0000 mg | ORAL_TABLET | Freq: Once | ORAL | Status: AC
  Administered 2019-08-02: 1000 mg via ORAL
  Filled 2019-08-02: qty 2

## 2019-08-02 MED ORDER — CEPHALEXIN 500 MG PO CAPS: 500.0000 mg | ORAL_CAPSULE | Freq: Three times a day (TID) | ORAL | 0 refills | Status: AC

## 2019-08-02 MED ORDER — TRAMADOL HCL 50 MG PO TABS
50.0000 mg | ORAL_TABLET | Freq: Once | ORAL | Status: AC
  Administered 2019-08-02: 50 mg via ORAL
  Filled 2019-08-02: qty 1

## 2019-08-02 NOTE — ED Provider Notes (Signed)
[facility] Emergency Department Provider Note  ____________________________________________  Time seen: Approximately 6:06 AM  I have reviewed the triage vital signs and the nursing notes.   HISTORY  Chief Complaint Hand Pain   HPI Janet [name] is a 34 y.o. female with a history of chronic injury to the right long finger who presents for evaluation of pain and swelling of the same finger.  Patient reports a week ago she was folding laundry and playing with her toddler when she hit the finger onto the wooden headboard of the bed.  She had immediate pain and noticed that the finger started to swell up.  Over the last few days it has been more more swollen and he feels hot to the touch.  She reports the pain is throbbing, 9 out of 10, constant, worse with movement of the finger.  No history of gout.  She denies any open skin or lacerations associated with this.  Patient reports several years ago she had a snake bite to this finger which led to significant swelling and resulted in surgery.  Patient has a chronic ulnar deviated and hyperflexed finger.  No fever or chills.  Past Medical History:  Diagnosis Date  . Anemia   . Asthma   . Depression   . Gestational diabetes mellitus, antepartum 08/12/2016   Moved pt to BRX GDM program [place] at 38 week EFW 7#13oz, 82nd%  . Headache    migraines  . History of cesarean delivery 04/24/2016   Twins, breech, 64m and pt not told she needs rpt. Pt told she's eligible for TOLAC; she desires RCS.     Patient Active Problem List   Diagnosis Date Noted  . Tension headache 06/15/2019  . Hot flashes 04/06/2019  . Current moderate episode of major depressive disorder without prior episode (HCC) 04/06/2019  . Psychophysiological insomnia 04/06/2019  . Chronic bilateral low back pain without sciatica 11/26/2017  . Right wrist pain 11/26/2017  . Finger pain, left 11/26/2017  . Contracture of finger joint, 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    . Finger deformity, acquired, right 02/27/2017  . 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 Benign paroxysmal positional vertigo 02/14/2017  . Asthma 07/24/2016  . Obesity, Class II, BMI 35-39.9 06/27/2016  . Migraine 06/12/2016  . Sickle cell trait (HCC) 05/15/2016  . History of gestational hypertension 04/24/2016    Past Surgical History:  Procedure Laterality Date  . CESAREAN SECTION    . CESAREAN SECTION N/A 10/17/2016   Procedure: REPEAT CESAREAN SECTION;  Surgeon: Adam Phenix, MD;  Location: [facility] BIRTHING SUITES;  Service: Obstetrics;  Laterality: N/A;  . DILATION AND CURETTAGE OF UTERUS      Prior to Admission medications   Medication Sig Start Date End Date Taking? Authorizing Provider  albuterol (PROVENTIL HFA;VENTOLIN HFA) 108 (90 Base) MCG/ACT inhaler Inhale 2 puffs into the lungs every 6 (six) hours as needed for wheezing or shortness of breath. 07/24/16   Anyanwu, Jethro Bastos, MD  calcium carbonate (TUMS - DOSED IN MG ELEMENTAL CALCIUM) 500 MG chewable tablet Chew 2 tablets by mouth daily as needed for indigestion or heartburn.    [provider]  cephALEXin (KEFLEX) 500 MG capsule Take 1 capsule (500 mg total) by mouth 3 (three) times daily for 7 days. 08/02/19 08/09/19  Nita Sickle, MD  cyclobenzaprine (FLEXERIL) 10 MG tablet Take 0.5 tablets (5 mg total) by mouth 3 (three) times daily as needed for muscle spasms. 06/23/19   Chinita Pester, FNP  diclofenac  Sodium (VOLTAREN) 1 % GEL APPLY 4 GRAMS TOPICALLY 4 TIMES A DAY. PA REQUIRED 06/20/19   Lynnda Childody, Jessica R, MD  Etonogestrel [facility](NEXPLANON [place]) Inject into the skin.    [provider]  oxyCODONE-acetaminophen (PERCOCET) 5-325 MG tablet Take 1 tablet by mouth every 6 (six) hours as needed. 06/23/19 06/22/20  Triplett, Kasandra Knudsenari B, FNP  predniSONE (STERAPRED UNI-PAK 21 TAB) 10 MG (21) TBPK tablet Take 6 tablets on the first day and decrease by 1 tablet each day until finished. 06/23/19   Triplett, Rulon Eisenmengerari B, FNP  SUMAtriptan (IMITREX) 50  MG tablet Take 1 tablet (50 mg total) by mouth every 2 (two) hours as needed for migraine. May repeat in 2 hours if headache persists or recurs. 06/15/19   Lynnda Childody, Jessica R, MD  traMADol (ULTRAM) 50 MG tablet Take 1 tablet (50 mg total) by mouth every 6 (six) hours as needed. 08/02/19 08/01/20  Nita SickleVeronese, [place], MD  Vitamin D, Ergocalciferol, (DRISDOL) 1.25 MG (50000 UNIT) CAPS capsule Take 1 capsule (50,000 Units total) by mouth every 7 (seven) days. 04/07/19   Lynnda Childody, Jessica R, MD    Allergies Flagyl [metronidazole], Ibuprofen, Nsaids, Zofran [ondansetron hcl], and Levaquin [levofloxacin in d5w]  Family History  Problem Relation Age of Onset  . Hypertension Mother   . Heart disease Mother   . Diabetes Father   . Breast cancer Maternal Aunt [id number]    Social History Social History   Tobacco Use  . Smoking status: Never Smoker  . Smokeless tobacco: Never Used  Vaping Use  . Vaping Use: Never used  Substance Use Topics  . Alcohol use: Yes    Comment: once every few months  . Drug use: No    Review of Systems  Constitutional: Negative for fever. Eyes: Negative for visual changes. ENT: Negative for sore throat. Neck: No neck pain  Cardiovascular: Negative for chest pain. Respiratory: Negative for shortness of breath. Gastrointestinal: Negative for abdominal pain, vomiting or diarrhea. Genitourinary: Negative for dysuria. Musculoskeletal: Negative for back pain. + R middle finger pain and swelling Skin: Negative for rash. Neurological: Negative for headaches, weakness or numbness. Psych: No SI or HI  ____________________________________________   PHYSICAL EXAM:  VITAL SIGNS: ED Triage Vitals  Enc Vitals Group     BP 08/02/19 0410 (!) 145/90     Pulse Rate 08/02/19 0410 74     Resp 08/02/19 0410 18     Temp 08/02/19 0410 98.7 F (37.1 C)     Temp Source 08/02/19 0410 Oral     SpO2 08/02/19 0410 100 %     Weight 08/02/19 0401 240 lb (108.9 kg)     Height 08/02/19 0401 5'  9" (1.753 m)     Head Circumference --      Peak Flow --      Pain Score 08/02/19 0400 10     Pain Loc --      Pain Edu? --      Excl. in [place]? --     Constitutional: Alert and oriented. Well appearing and in no apparent distress. HEENT:      Head: Normocephalic and atraumatic.         Eyes: Conjunctivae are normal. Sclera is non-icteric.       Mouth/Throat: Mucous membranes are moist.       Neck: Supple with no signs of meningismus. Cardiovascular: Regular rate and rhythm.  Respiratory: Normal respiratory effort.  Musculoskeletal: Chronically ulnar deviated and hyperflexed right middle finger with significant swelling, mild  diffuse erythema and warmth to the touch with no streaking.  The erythema in the warmer mostly on the dorsal aspect of the finger, none on the palmar aspect.  Compartments are soft.  Brisk cap refill distally. Neurologic: Normal speech and language. Face is symmetric. Moving all extremities. No gross focal neurologic deficits are appreciated. Skin: Skin is warm, dry and intact. No rash noted. Psychiatric: Mood and affect are normal. Speech and behavior are normal.  ____________________________________________   LABS (all labs ordered are listed, but only abnormal results are displayed)  Labs Reviewed - No data to display ____________________________________________  EKG  none  ____________________________________________  RADIOLOGY  I have personally reviewed the images performed during this visit and I agree with the Radiologist's read.   Interpretation by Radiologist:  DG Finger Middle Right  Result Date: 08/02/2019 CLINICAL DATA:  Pain and swelling. History of remote injury to the finger and chronic flexion deformity. EXAM: RIGHT MIDDLE FINGER 2+V COMPARISON:  None. FINDINGS: Ankylosis is noted at the proximal interphalangeal joint of the long finger with a flexion deformity. There is also moderate flexion at the distal interphalangeal joint. No acute  bony abnormality is identified. There is moderate diffuse soft tissue swelling involving the long finger. IMPRESSION: 1. 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 2. No acute bony findings. 3. Diffuse soft tissue swelling involving the long finger. Electronically Signed   By: Rudie Meyer M.D.   On: 08/02/2019 05:11     ____________________________________________   PROCEDURES  Procedure(s) performed: None Procedures Critical Care performed:  None ____________________________________________   INITIAL IMPRESSION / ASSESSMENT AND PLAN / ED COURSE  34 y.o. female with a history of chronic injury to the right long finger who presents for evaluation of traumatic pain and swelling of the same finger.  Normal vital signs, digit is chronically ulnar deviated and hyperflexed but now has significant swelling with mild erythema and warmth.  No crepitus, no bulla, skin is intact otherwise.  Possibly mild cellulitis.  X-ray visualized by me showing chronic changes but no acute dislocation or fracture. Low suspicion for osteo with no XR findings consistent with that, no fever, no skin laceration. Patient denies any foreign bodies.  Compartments are soft with brisk distal capillary refill.  Will start patient on a course of Keflex for mild cellulitis.  Unfortunately patient cannot take NSAIDs due to anaphylaxis.  Will treat with Tylenol and tramadol.  No signs of tender flexor synovitis on exam.  Discussed strict return precautions if symptoms are not improving in 48 hours.  Discussed follow-up with orthopedics.  Recommended return to the emergency room she spikes a fever or if the pain and swelling are getting worse.  Old medical records reviewed.     _____________________________________________ Please note:  Patient was evaluated in Emergency Department today for the symptoms described in the history of present illness. Patient was evaluated in the  context of the global COVID-19 pandemic, which necessitated consideration that the patient might be at risk for infection with the SARS-CoV-2 virus that causes COVID-19. Institutional protocols and algorithms that pertain to the evaluation of patients at risk for COVID-19 are in a state of rapid change based on information released by regulatory bodies including the CDC and federal and state organizations. These policies and algorithms were followed during the patient's care in the ED.  Some ED evaluations and interventions may be delayed as a result of limited staffing during the pandemic.   [place] Controlled  Substance Database was reviewed by me. ____________________________________________   FINAL CLINICAL IMPRESSION(S) / ED DIAGNOSES   Final diagnoses:  Cellulitis of finger of right hand      NEW MEDICATIONS STARTED DURING THIS VISIT:  ED Discharge Orders         Ordered    cephALEXin (KEFLEX) 500 MG capsule  3 times daily     Discontinue  Reprint     08/02/19 0613    traMADol (ULTRAM) 50 MG tablet  Every 6 hours PRN     Discontinue  Reprint     08/02/19 0300           Note:  This document was prepared using Dragon voice recognition software and may include unintentional dictation errors.    Don Perking, [place], MD 08/02/19 [phone]

## 2019-08-02 NOTE — ED Notes (Signed)
Pt states she drove here. Pt educated that she should not drive after taking tramadol. Pt states she lives 7 minutes away and will be fine to drive home. Discussed with ER provider and ER provider stated pt is okay to go home right now.

## 2019-08-02 NOTE — Discharge Instructions (Signed)

## 2019-08-02 NOTE — ED Triage Notes (Signed)
Pt arrived to ED from home with c/o pain and swelling to her middle digit on her right hand. Pt states she hit her hand on a piece of wood in her bedroom about a week ago immediately followed by pain. Swelling and increase in pain the past 2 days. Swelling noted.

## 2019-08-03 NOTE — Telephone Encounter (Signed)
Last refill 04/07/19 Last OV: 06/15/19 for migraines Last lab: 04/06/19 No future appt scheduled

## 2019-11-14 ENCOUNTER — Telehealth: Payer: Self-pay | Admitting: Family Medicine

## 2019-11-14 NOTE — Telephone Encounter (Signed)
Pt called wanting to transfer care from dr cody to dr g.   She is aware dr g is not taking any new pt at this time.  Ok to schedule

## 2019-11-14 NOTE — Telephone Encounter (Signed)
Ok with transfer to Dr. Reece Agar or any other provider if Dr. Reece Agar declines

## 2019-11-14 NOTE — Telephone Encounter (Signed)
Sorry I'm not taking new patients at this time.

## 2019-11-15 NOTE — Telephone Encounter (Signed)
I will take her in transfer if she would like. Please be sure to tell her that I am a nurse practitioner. She can be scheduled with me for 30 minute appointment when she is due for next follow up.

## 2019-11-18 NOTE — Telephone Encounter (Signed)
Pt made new patient appointment with anupa ganta

## 2019-12-20 ENCOUNTER — Ambulatory Visit (INDEPENDENT_AMBULATORY_CARE_PROVIDER_SITE_OTHER): Payer: BC Managed Care – PPO | Admitting: Family Medicine

## 2019-12-20 ENCOUNTER — Encounter: Payer: Self-pay | Admitting: Family Medicine

## 2019-12-20 ENCOUNTER — Other Ambulatory Visit: Payer: Self-pay

## 2019-12-20 VITALS — BP 124/88 | HR 95 | Ht 69.0 in | Wt 254.8 lb

## 2019-12-20 DIAGNOSIS — Z0000 Encounter for general adult medical examination without abnormal findings: Secondary | ICD-10-CM | POA: Diagnosis not present

## 2019-12-20 DIAGNOSIS — Z23 Encounter for immunization: Secondary | ICD-10-CM

## 2019-12-20 DIAGNOSIS — G4700 Insomnia, unspecified: Secondary | ICD-10-CM | POA: Insufficient documentation

## 2019-12-20 DIAGNOSIS — F5104 Psychophysiologic insomnia: Secondary | ICD-10-CM

## 2019-12-20 DIAGNOSIS — G43109 Migraine with aura, not intractable, without status migrainosus: Secondary | ICD-10-CM

## 2019-12-20 DIAGNOSIS — E559 Vitamin D deficiency, unspecified: Secondary | ICD-10-CM | POA: Diagnosis not present

## 2019-12-20 DIAGNOSIS — F339 Major depressive disorder, recurrent, unspecified: Secondary | ICD-10-CM

## 2019-12-20 MED ORDER — SUMATRIPTAN SUCCINATE 50 MG PO TABS: 50.0000 mg | ORAL_TABLET | Freq: Every day | ORAL | 0 refills | Status: DC | PRN

## 2019-12-20 MED ORDER — CITALOPRAM HYDROBROMIDE 20 MG PO TABS: 20.0000 mg | ORAL_TABLET | Freq: Every day | ORAL | 3 refills | Status: DC

## 2019-12-20 NOTE — Assessment & Plan Note (Addendum)
-  patient likely has diagnosis of depression that has been ongoing and unrecognized, discussed ultimate benefits of pharmacologic therapy and counseling in conjunction, patient possibly open to counseling in the future when she has time -PHQ-9 reviewed -citalopram 20 mg daily prescribed, follow up in 3 months to reassess and make changes accordingly

## 2019-12-20 NOTE — Progress Notes (Signed)
    SUBJECTIVE:   CHIEF COMPLAINT / HPI:   Patient with past medical history of asthma, sickle cell trait, Vitamin D deficiency and migraines presents to the clinic to establish care, previously seen by her PCP, Dr. Selena Batten. Also reports experiencing sweats, fatigue, weight gain and insomnia.   Mood changes Endorsing multiple episodes of feeling down and depressed since 2 years ago that seem to be occurring more often. States that she gets agitated more easily and finds herself liking to be alone, reports crying more than usual. States that her husband is her support system but is often busy with work leaving her home taking care of the kids. Reports trouble falling asleep and little interest doing things most days.  PERTINENT  PMH / PSH:   Migraine Reports some light sensitivity without aura. Occurs sometimes multiple times a week. Denies any other associated symptoms. Previously took sumatriptan but has not been able to take it since she required refills so states that she has just been trying to cope without any medication.   Vitamin D deficiency Most recent blood work from 03/2019 notable for vitamin D level of 19.67, patient was prescribed vitamin D (ergocalciferol) 50,000 units every 7 days. Compliant on medication until recently when she ran out.   OBJECTIVE:   BP 124/88   Pulse 95   Ht 5\' 9"  (1.753 m)   Wt 254 lb 12.8 oz (115.6 kg)   SpO2 100%   BMI 37.63 kg/m   General: Patient well-appearing, in no acute distress. HEENT: normocephalic, atraumatic, non-tender thyroid  CV: RRR, no murmurs or gallops Resp: lungs clear to auscultation bilaterally Abdomen: soft, non-tender, presence of active bowel sounds Ext: radial pulses strong and equal bilaterally, no LE edema noted bilaterally Psych: mood appropriate, pleasant, denies SI or HI  ASSESSMENT/PLAN:   Depression, recurrent (HCC) -patient likely has diagnosis of depression that has been ongoing and unrecognized, discussed  ultimate benefits of pharmacologic therapy and counseling in conjunction, patient possibly open to counseling in the future when she has time -PHQ-9 reviewed -citalopram 20 mg daily prescribed, follow up in 3 months to reassess and make changes accordingly   Migraine -prescribed sumatriptan 50 mg daily  Vitamin D deficiency -instructed patient to hold off taking previously prescribed vitamin D -pending Vitamin D, will notify patient of results, if within normal limits, will instruct patient to take vitamin D supplements OTC -encouraged healthy diet and exercise   Insomnia -discussed sleep hygiene, meditation and yoga prior to bed to de-stress  -OTC melatonin as needed  -Given associated symptoms of weight gain, decreased energy, fatigue and abnormal thermoregulation, TSH and T4 pending, will notify patient of any abnormal results. Possibly also due to depression.  Health maintenance -influenza vaccination administered -COVID booster administered  -PAP smear due in 2024, last PAP normal -given first visit: CMP and CBC obtained, pending results   Unable to print AVS summary due to technical issues. Plan discussed with patient, she voices understanding and agrees. Follow up in 3 months.   2025, DO [place] [facility]

## 2019-12-20 NOTE — Assessment & Plan Note (Signed)
-  instructed patient to hold off taking previously prescribed vitamin D -pending Vitamin D, will notify patient of results, if within normal limits, will instruct patient to take vitamin D supplements OTC -encouraged healthy diet and exercise

## 2019-12-20 NOTE — Assessment & Plan Note (Signed)
-  discussed sleep hygiene, meditation and yoga prior to bed to de-stress  -OTC melatonin as needed  -Given associated symptoms of weight gain, decreased energy, fatigue and abnormal thermoregulation, TSH and T4 pending, will notify patient of any abnormal results. Possibly also due to depression.

## 2019-12-20 NOTE — Assessment & Plan Note (Signed)
-  prescribed sumatriptan 50 mg daily

## 2019-12-21 ENCOUNTER — Other Ambulatory Visit: Payer: Self-pay | Admitting: Family Medicine

## 2019-12-21 DIAGNOSIS — E559 Vitamin D deficiency, unspecified: Secondary | ICD-10-CM

## 2019-12-21 LAB — COMPREHENSIVE METABOLIC PANEL
ALT: 16 IU/L (ref 0–32)
AST: 18 IU/L (ref 0–40)
Albumin/Globulin Ratio: 2 (ref 1.2–2.2)
Albumin: 4.5 g/dL (ref 3.8–4.8)
Alkaline Phosphatase: 58 IU/L (ref 44–121)
BUN/Creatinine Ratio: 11 (ref 9–23)
BUN: 9 mg/dL (ref 6–20)
Bilirubin Total: 0.5 mg/dL (ref 0.0–1.2)
CO2: 21 mmol/L (ref 20–29)
Calcium: 9.3 mg/dL (ref 8.7–10.2)
Chloride: 105 mmol/L (ref 96–106)
Creatinine, Ser: 0.79 mg/dL (ref 0.57–1.00)
GFR calc Af Amer: 112 mL/min/{1.73_m2} (ref >59)
GFR calc non Af Amer: 97 mL/min/{1.73_m2} (ref >59)
Globulin, Total: 2.3 g/dL (ref 1.5–4.5)
Glucose: 103 mg/dL — ABNORMAL HIGH (ref 65–99)
Potassium: 4.3 mmol/L (ref 3.5–5.2)
Sodium: 138 mmol/L (ref 134–144)
Total Protein: 6.8 g/dL (ref 6.0–8.5)

## 2019-12-21 LAB — CBC WITH DIFFERENTIAL/PLATELET
Basophils Absolute: 0 10*3/uL (ref 0.0–0.2)
Basos: 1 %
EOS (ABSOLUTE): 0.1 10*3/uL (ref 0.0–0.4)
Eos: 2 %
Hematocrit: 40.4 % (ref 34.0–46.6)
Hemoglobin: 13.4 g/dL (ref 11.1–15.9)
Immature Grans (Abs): 0 10*3/uL (ref 0.0–0.1)
Immature Granulocytes: 0 %
Lymphocytes Absolute: 1.9 10*3/uL (ref 0.7–3.1)
Lymphs: 44 %
MCH: 28.6 pg (ref 26.6–33.0)
MCHC: 33.2 g/dL (ref 31.5–35.7)
MCV: 86 fL (ref 79–97)
Monocytes Absolute: 0.3 10*3/uL (ref 0.1–0.9)
Monocytes: 6 %
Neutrophils Absolute: 2.1 10*3/uL (ref 1.4–7.0)
Neutrophils: 47 %
Platelets: 246 10*3/uL (ref 150–450)
RBC: 4.68 x10E6/uL (ref 3.77–5.28)
RDW: 13.4 % (ref 11.7–15.4)
WBC: 4.4 10*3/uL (ref 3.4–10.8)

## 2019-12-21 LAB — TSH: TSH: 1.18 u[IU]/mL (ref 0.450–4.500)

## 2019-12-21 LAB — VITAMIN D 25 HYDROXY (VIT D DEFICIENCY, FRACTURES): Vit D, 25-Hydroxy: 18.8 ng/mL — ABNORMAL LOW (ref 30.0–100.0)

## 2019-12-21 LAB — T4, FREE: Free T4: 1.26 ng/dL (ref 0.82–1.77)

## 2019-12-21 MED ORDER — VITAMIN D (ERGOCALCIFEROL) 1.25 MG (50000 UNIT) PO CAPS: 50000.0000 [IU] | ORAL_CAPSULE | ORAL | 2 refills | Status: DC

## 2020-01-12 ENCOUNTER — Other Ambulatory Visit: Payer: Self-pay | Admitting: Family Medicine

## 2020-01-18 ENCOUNTER — Other Ambulatory Visit: Payer: Self-pay | Admitting: Family Medicine

## 2020-01-18 DIAGNOSIS — G43109 Migraine with aura, not intractable, without status migrainosus: Secondary | ICD-10-CM

## 2020-02-06 ENCOUNTER — Other Ambulatory Visit: Payer: No Typology Code available for payment source

## 2020-02-06 DIAGNOSIS — Z20822 Contact with and (suspected) exposure to covid-19: Secondary | ICD-10-CM

## 2020-02-07 LAB — NOVEL CORONAVIRUS, NAA: SARS-CoV-2, NAA: DETECTED — AB

## 2020-02-07 LAB — SARS-COV-2, NAA 2 DAY TAT

## 2020-04-24 ENCOUNTER — Encounter: Payer: Self-pay | Admitting: Family Medicine

## 2020-04-24 ENCOUNTER — Ambulatory Visit (INDEPENDENT_AMBULATORY_CARE_PROVIDER_SITE_OTHER): Payer: 59 | Admitting: Family Medicine

## 2020-04-24 ENCOUNTER — Other Ambulatory Visit: Payer: Self-pay

## 2020-04-24 VITALS — BP 146/112 | HR 68 | Ht 69.0 in | Wt 254.8 lb

## 2020-04-24 DIAGNOSIS — G43109 Migraine with aura, not intractable, without status migrainosus: Secondary | ICD-10-CM

## 2020-04-24 DIAGNOSIS — R8290 Unspecified abnormal findings in urine: Secondary | ICD-10-CM

## 2020-04-24 DIAGNOSIS — I10 Essential (primary) hypertension: Secondary | ICD-10-CM | POA: Diagnosis not present

## 2020-04-24 DIAGNOSIS — E559 Vitamin D deficiency, unspecified: Secondary | ICD-10-CM

## 2020-04-24 DIAGNOSIS — R42 Dizziness and giddiness: Secondary | ICD-10-CM

## 2020-04-24 MED ORDER — SUMATRIPTAN SUCCINATE 50 MG PO TABS: 50.0000 mg | ORAL_TABLET | Freq: Every day | ORAL | 0 refills | Status: DC | PRN

## 2020-04-24 MED ORDER — AMLODIPINE BESYLATE 5 MG PO TABS: 5.0000 mg | ORAL_TABLET | Freq: Every day | ORAL | 1 refills | Status: DC

## 2020-04-24 MED ORDER — FLUCONAZOLE 150 MG PO TABS: 150.0000 mg | ORAL_TABLET | Freq: Once | ORAL | 0 refills | Status: AC

## 2020-04-24 MED ORDER — CYCLOBENZAPRINE HCL 5 MG PO TABS: 5.0000 mg | ORAL_TABLET | Freq: Three times a day (TID) | ORAL | 0 refills | Status: DC | PRN

## 2020-04-24 NOTE — Patient Instructions (Signed)

## 2020-04-24 NOTE — Assessment & Plan Note (Signed)
??   Related to her vertigo. Although, occurs sometimes regardless of positioning. No neurologic deficit on exam. No cardiopulm symptoms. R/O anemia/endocrine disorder. CBC and TSH checked. ED precaution if worsening or f/u with PCP soon. I will contact her soon with the result. She agreed with the plan.

## 2020-04-24 NOTE — Progress Notes (Signed)
    SUBJECTIVE:   CHIEF COMPLAINT / HPI:  Dizziness:  C/O dizziness on and off x 1 month. This has progressively become a daily occurrence. Dizziness worsens on exertion and improves with sitting. Denies N/V, no headache. She had vertigo years ago, which was associated with positioning. This seems to be different. Feels ok today. Her last episode was yesterday, and it lasted for 10 secs and then self-resolved.  HTN: Hx of HTN for many years. She was first diagnosed in 2009. She was on an antiHTNs agent in the past, last dosed in 2015. She is here for assessment and treatment. Migraine - Imitrex refill Vit D  UA:  C/O an unpleasant urine odor that started a few days ago. She also endorses whitish vaginal discharge. She denies burning with urination or increased urine frequency. She is sexually active with the same partner of 6 yrs. LMP:04/21/20, still on her period, which is heavy and irregular.   vit D Def: Asked med refill.  Migraine: Need refill of her Imitrex. Also requested refill of Flexeril for MSK pain.  PERTINENT  PMH / PSH: PMX reviewed.  OBJECTIVE:   Vitals:   04/24/20 0907 04/24/20 0932  BP: (!) 152/103 (!) 146/112  Pulse: 68   SpO2: 97%   Weight: 254 lb 12.8 oz (115.6 kg)   Height: 5\' 9"  (1.753 m)     Physical Exam Vitals and nursing note reviewed.  Cardiovascular:     Rate and Rhythm: Normal rate and regular rhythm.     Heart sounds: Normal heart sounds. No murmur heard.   Pulmonary:     Effort: Pulmonary effort is normal. No respiratory distress.     Breath sounds: Normal breath sounds. No wheezing.  Abdominal:     General: Bowel sounds are normal. There is no distension.     Palpations: Abdomen is soft. There is no mass.     Tenderness: There is no abdominal tenderness.  Musculoskeletal:     Right lower leg: No edema.     Left lower leg: No edema.  Neurological:     General: No focal deficit present.     Cranial Nerves: Cranial nerves are intact.      Sensory: Sensation is intact.     Motor: Motor function is intact.      ASSESSMENT/PLAN:   Dizziness ?? Related to her vertigo. Although, occurs sometimes regardless of positioning. No neurologic deficit on exam. No cardiopulm symptoms. R/O anemia/endocrine disorder. CBC and TSH checked. ED precaution if worsening or f/u with PCP soon. I will contact her soon with the result. She agreed with the plan.  Essential hypertension BP chronically elevated, especially diastolic. See home BP log below. Start Norvasc 5 mg QD. F/U in 2 weeks with PCP for medication adjustment. She agreed with the plan.      Vitamin D deficiency Level checked. I will contact her soon with her result.  Migraine I refilled her Imitrex   Urine odor: Unable to get wet prep/UA She declined pelvic exam since her period is currently on and heavy. Urine sent for culture. Treated empirically with Diflucan for her vaginal discharge. F/U soon if there is no improvement. She agreed with the plan.  Multiple skin tags mentioned at the end of the visit. I encouraged her to schedule derm clinic appointment for this. She verbalized understanding.  , MD [facility]

## 2020-04-24 NOTE — Assessment & Plan Note (Addendum)
BP chronically elevated, especially diastolic. See home BP log below. Start Norvasc 5 mg QD. F/U in 2 weeks with PCP for medication adjustment. She agreed with the plan.

## 2020-04-24 NOTE — Assessment & Plan Note (Signed)
Level checked. I will contact her soon with her result.

## 2020-04-24 NOTE — Assessment & Plan Note (Signed)
I refilled her Imitrex. 

## 2020-04-25 ENCOUNTER — Telehealth: Payer: Self-pay | Admitting: Family Medicine

## 2020-04-25 DIAGNOSIS — E559 Vitamin D deficiency, unspecified: Secondary | ICD-10-CM

## 2020-04-25 LAB — CBC WITH DIFFERENTIAL/PLATELET
Basophils Absolute: 0 10*3/uL (ref 0.0–0.2)
Basos: 0 %
EOS (ABSOLUTE): 0.1 10*3/uL (ref 0.0–0.4)
Eos: 2 %
Hematocrit: 40.8 % (ref 34.0–46.6)
Hemoglobin: 13.6 g/dL (ref 11.1–15.9)
Immature Grans (Abs): 0 10*3/uL (ref 0.0–0.1)
Immature Granulocytes: 0 %
Lymphocytes Absolute: 2.4 10*3/uL (ref 0.7–3.1)
Lymphs: 46 %
MCH: 28.6 pg (ref 26.6–33.0)
MCHC: 33.3 g/dL (ref 31.5–35.7)
MCV: 86 fL (ref 79–97)
Monocytes Absolute: 0.4 10*3/uL (ref 0.1–0.9)
Monocytes: 8 %
Neutrophils Absolute: 2.4 10*3/uL (ref 1.4–7.0)
Neutrophils: 44 %
Platelets: 245 10*3/uL (ref 150–450)
RBC: 4.76 x10E6/uL (ref 3.77–5.28)
RDW: 13.6 % (ref 11.7–15.4)
WBC: 5.4 10*3/uL (ref 3.4–10.8)

## 2020-04-25 LAB — TSH: TSH: 1.51 u[IU]/mL (ref 0.450–4.500)

## 2020-04-25 LAB — VITAMIN D 25 HYDROXY (VIT D DEFICIENCY, FRACTURES): Vit D, 25-Hydroxy: 21.3 ng/mL — ABNORMAL LOW (ref 30.0–100.0)

## 2020-04-25 MED ORDER — VITAMIN D (ERGOCALCIFEROL) 1.25 MG (50000 UNIT) PO CAPS: 50000.0000 [IU] | ORAL_CAPSULE | ORAL | 0 refills | Status: DC

## 2020-04-25 NOTE — Telephone Encounter (Signed)
Normal CBC discussed.  ED precaution discussed for vertigo/dizziness.  F/U appointment made with her PCP.  Also, colpo appointment for Nexplanon removal.  Vit D insufficiency. Med refilled. F/U with PCP for further management.

## 2020-04-26 LAB — URINE CULTURE

## 2020-05-10 ENCOUNTER — Other Ambulatory Visit: Payer: Self-pay

## 2020-05-10 ENCOUNTER — Ambulatory Visit (INDEPENDENT_AMBULATORY_CARE_PROVIDER_SITE_OTHER): Payer: 59 | Admitting: Family Medicine

## 2020-05-10 VITALS — BP 120/80 | HR 80 | Ht 69.0 in | Wt 255.4 lb

## 2020-05-10 DIAGNOSIS — L918 Other hypertrophic disorders of the skin: Secondary | ICD-10-CM

## 2020-05-10 DIAGNOSIS — G43109 Migraine with aura, not intractable, without status migrainosus: Secondary | ICD-10-CM

## 2020-05-10 NOTE — Patient Instructions (Signed)
Wound Care, Adult Taking care of your wound properly can help to prevent pain, infection, and scarring. It can also help your wound heal more quickly. Follow instructions from your health care provider about how to care for your wound. Supplies needed:  Soap and water.  Wound cleanser.  Gauze.  If needed, a clean bandage (dressing) or other type of wound dressing material to cover or place in the wound. Follow your health care provider's instructions about what dressing supplies to use.  Cream or ointment to apply to the wound, if told by your health care provider. How to care for your wound Cleaning the wound Ask your health care provider how to clean the wound. This may include:  Using mild soap and water or a wound cleanser.  Using a clean gauze to pat the wound dry after cleaning it. Do not rub or scrub the wound. Dressing care  Wash your hands with soap and water for at least 20 seconds before and after you change the dressing. If soap and water are not available, use hand sanitizer.  Change your dressing as told by your health care provider. This may include: ? Cleaning or rinsing out (irrigating) the wound. ? Placing a dressing over the wound or in the wound (packing). ? Covering the wound with an outer dressing.  Leave any stitches (sutures), skin glue, or adhesive strips in place. These skin closures may need to stay in place for 2 weeks or longer. If adhesive strip edges start to loosen and curl up, you may trim the loose edges. Do not remove adhesive strips completely unless your health care provider tells you to do that.  Ask your health care provider when you can leave the wound uncovered. Checking for infection Check your wound area every day for signs of infection. Check for:  More redness, swelling, or pain.  Fluid or blood.  Warmth.  Pus or a bad smell.   Follow these instructions at home Medicines  If you were prescribed an antibiotic medicine, cream, or  ointment, take or apply it as told by your health care provider. Do not stop using the antibiotic even if your condition improves.  If you were prescribed pain medicine, take it 30 minutes before you do any wound care or as told by your health care provider.  Take over-the-counter and prescription medicines only as told by your health care provider. Eating and drinking  Eat a diet that includes protein, vitamin A, vitamin C, and other nutrient-rich foods to help the wound heal. ? Foods rich in protein include meat, fish, eggs, dairy, beans, and nuts. ? Foods rich in vitamin A include carrots and dark green, leafy vegetables. ? Foods rich in vitamin C include citrus fruits, tomatoes, broccoli, and peppers.  Drink enough fluid to keep your urine pale yellow. General instructions  Do not take baths, swim, use a hot tub, or do anything that would put the wound underwater until your health care provider approves. Ask your health care provider if you may take showers. You may only be allowed to take sponge baths.  Do not scratch or pick at the wound. Keep it covered as told by your health care provider.  Return to your normal activities as told by your health care provider. Ask your health care provider what activities are safe for you.  Protect your wound from the sun when you are outside for the first 6 months, or for as long as told by your health care provider. Cover   up the scar area or apply sunscreen that has an SPF of at least 30.  Do not use any products that contain nicotine or tobacco, such as cigarettes, e-cigarettes, and chewing tobacco. These may delay wound healing. If you need help quitting, ask your health care provider.  Keep all follow-up visits as told by your health care provider. This is important. Contact a health care provider if:  You received a tetanus shot and you have swelling, severe pain, redness, or bleeding at the injection site.  Your pain is not controlled  with medicine.  You have any of these signs of infection: ? More redness, swelling, or pain around the wound. ? Fluid or blood coming from the wound. ? Warmth coming from the wound. ? Pus or a bad smell coming from the wound. ? A fever or chills.  You are nauseous or you vomit.  You are dizzy. Get help right away if:  You have a red streak of skin near the area around your wound.  Your wound has been closed with staples, sutures, skin glue, or adhesive strips and it begins to open up and separate.  Your wound is bleeding, and the bleeding does not stop with gentle pressure.  You have a rash.  You faint.  You have trouble breathing. These symptoms may represent a serious problem that is an emergency. Do not wait to see if the symptoms will go away. Get medical help right away. Call your local emergency services (911 in the [place] Do not drive yourself to the hospital. Summary  Always wash your hands with soap and water for at least 20 seconds before and after changing your dressing.  Change your dressing as told by your health care provider.  To help with healing, eat foods that are rich in protein, vitamin A, vitamin C, and other nutrients.  Check your wound every day for signs of infection. Contact your health care provider if you suspect that your wound is infected. This information is not intended to replace advice given to you by your health care provider. Make sure you discuss any questions you have with your health care provider. Document Revised: 10/15/2018 Document Reviewed: 10/15/2018 Elsevier Patient Education  2021 Elsevier Inc.  

## 2020-05-11 ENCOUNTER — Other Ambulatory Visit: Payer: Self-pay | Admitting: Family Medicine

## 2020-05-11 DIAGNOSIS — L918 Other hypertrophic disorders of the skin: Secondary | ICD-10-CM | POA: Insufficient documentation

## 2020-05-11 DIAGNOSIS — G43109 Migraine with aura, not intractable, without status migrainosus: Secondary | ICD-10-CM

## 2020-05-11 HISTORY — DX: Other hypertrophic disorders of the skin: L91.8

## 2020-05-11 NOTE — Progress Notes (Signed)
    SUBJECTIVE:   CHIEF COMPLAINT / HPI:   Skin tag removal Patient reports that she has multiple skin tags on her axilla as well as in her groin and on her stomach underneath her breasts.  The most concerning skin tags are in her axillas especially her right axilla as well as under her breast.  She would like them removed today.    OBJECTIVE:   BP 120/80   Pulse 80   Ht 5\' 9"  (1.753 m)   Wt 255 lb 6.4 oz (115.8 kg)   SpO2 99%   BMI 37.72 kg/m   General: Well-appearing 35 year old female in no acute distress Derm: Patient has numerous skin tags of varying sizes and shapes in axilla bilaterally, underneath her breast.  We did not examine her groin because these were less concerning at this time.          PRE-OP DIAGNOSIS: Multiple skin tags POST-OP DIAGNOSIS: Same  PROCEDURE: skin lesion excision Performing Physician: _  Supervising Physician (if applicable): _  PROCEDURE:  Skin tag removal  The area surrounding the skin lesion was prepared and draped in the  usual sterile manner. The lesion was removed in the usual manner by the  biopsy method noted above. Hemostasis was assured.  Closure:     For most none, Some required silver nitrate.  Large skin tag removal underneath her right breast required three 4-0 Vicryl sutures.  Followup: The patient tolerated the procedure well without  complications.  Standard post-procedure care is explained and return  precautions are given.  ASSESSMENT/PLAN:   Skin tag Multiple skin tags removed from axilla bilaterally as well as largest skin tag removed underneath breast.  Had to suture large skin tag under breath using 4-0 Prolene suture.  Patient will return for suture removal.  Strict return precautions and ED precautions given.     31, MD [facility]

## 2020-05-11 NOTE — Assessment & Plan Note (Signed)
Multiple skin tags removed from axilla bilaterally as well as largest skin tag removed underneath breast.  Had to suture large skin tag under breath using 4-0 Prolene suture.  Patient will return for suture removal.  Strict return precautions and ED precautions given.

## 2020-05-16 ENCOUNTER — Other Ambulatory Visit: Payer: Self-pay | Admitting: Family Medicine

## 2020-05-17 ENCOUNTER — Ambulatory Visit (INDEPENDENT_AMBULATORY_CARE_PROVIDER_SITE_OTHER): Payer: 59 | Admitting: Family Medicine

## 2020-05-17 ENCOUNTER — Other Ambulatory Visit: Payer: Self-pay

## 2020-05-17 VITALS — BP 114/70 | HR 70 | Wt 255.0 lb

## 2020-05-17 DIAGNOSIS — Z30017 Encounter for initial prescription of implantable subdermal contraceptive: Secondary | ICD-10-CM

## 2020-05-17 DIAGNOSIS — Z3046 Encounter for surveillance of implantable subdermal contraceptive: Secondary | ICD-10-CM | POA: Diagnosis not present

## 2020-05-17 NOTE — Progress Notes (Signed)
   SUBJECTIVE:   CHIEF COMPLAINT / HPI:   Chief Complaint  Patient presents with  . Contraception     Janet [name] is a 35 y.o. female here for Nexplanon removal and insertion.    PERTINENT  PMH / PSH: reviewed and updated as appropriate   OBJECTIVE:   BP 114/70   Pulse 70   Wt 255 lb (115.7 kg)   LMP 04/27/2020   SpO2 98%   BMI 37.66 kg/m    GEN: well appearing female in no acute distress  CVS: well perfused  RESP: speaking in full sentences without pause, no respiratory distress  SKIN: Nexplanon palpable in the left upper extremity    ASSESSMENT/PLAN:   No problem-specific Assessment & Plan notes found for this encounter.   PRE-OP DIAGNOSIS: desired long-term, reversible contraception   POST-OP DIAGNOSIS: Same   PROCEDURE: Nexplanon  removal and placement  Performing Physician: removal performed by Dr. Jonelle Sports and insertion performed by Dr. Jiles Prows   PROCEDURE:  Urine pregnancy test: NA Written informed consent obtained Site (check): left arm        Sterile Preparation:    [x]       Betadine        [_]     Chloraprep        Left arm area prepped and draped in the usual sterile fashion. Five cc of lidocaine with epinephrine 1% used for local anesthesia. A small stab incision was made close to the nexplanon with scalpel. Hemostats were used to withdraw the nexplanon. A small bandage was applied over a steri strip  No complications.Patient given follow up instructions should she experience redness, swelling at sight or fever in the next 24 hours. Patient was reminded this totally removes her nexplanon contraceptive devise. (she can now potentially conceive)       After a time-out, insertion site was selected 6 - 10 cm from medial epicondyle and 3-5 cm inferior from sulcus and marked. Procedure area was prepped and draped in a sterile fashion. 5 mL of 1% lidocaine with epinephrine was used for subcutaneous anesthesia. Anesthesia confirmed.  Nexplanon  trocar  was inserted subcutaneously and then Nexplanon  capsule delivered subcutaneously. Trocar was removed from the insertion site. Nexplanon  capsule was palpated by provider and patient to assure satisfactory placement.  Estimated blood loss: minimal Dressings applied: steri-strips and co-band Followup: The patient tolerated the procedure well without complications.  Standard post-procedure care wass explained and return precautions were given.  Lot number: [id number] Expiration date: July 14 2022        07-19-1989, DO PGY-2, [facility] 05/17/2020

## 2020-05-17 NOTE — Patient Instructions (Addendum)

## 2020-05-22 ENCOUNTER — Ambulatory Visit: Payer: No Typology Code available for payment source | Admitting: Family Medicine

## 2020-05-24 ENCOUNTER — Ambulatory Visit (INDEPENDENT_AMBULATORY_CARE_PROVIDER_SITE_OTHER): Payer: 59 | Admitting: Family Medicine

## 2020-05-24 ENCOUNTER — Other Ambulatory Visit: Payer: Self-pay

## 2020-05-24 VITALS — BP 120/80 | HR 82 | Ht 69.0 in | Wt 258.2 lb

## 2020-05-24 DIAGNOSIS — L918 Other hypertrophic disorders of the skin: Secondary | ICD-10-CM | POA: Diagnosis not present

## 2020-05-24 NOTE — Patient Instructions (Signed)
Today we removed several skin tags. As with any skin procedures, there is always a risk of infection and bleeding as well as irritation. If you have any signs of infection or are concerned, please come back to the office. If you have more skin tags you would like removed just let our office know.

## 2020-05-24 NOTE — Assessment & Plan Note (Addendum)
Removal of 12 skin tags, patient will return to care for further skin tag removals as necessary. Wound care instructions given.

## 2020-05-24 NOTE — Progress Notes (Signed)
    SUBJECTIVE:   CHIEF COMPLAINT / HPI:   Presents for skin tag removal of several lesions. No other complaints or concerns. She had another skin tag removed previously which required stitches and are still healing, she is covering the area with a Band-Aid.   PERTINENT  PMH / PSH: Reviewed  OBJECTIVE:   BP 120/80   Pulse 82   Ht 5\' 9"  (1.753 m)   Wt 258 lb 3.2 oz (117.1 kg)   LMP 04/27/2020   SpO2 98%   BMI 38.13 kg/m   General: well-appearing, well-nourished, NAD Skin: multiple skin tags in axilla, under breast, and in groin area.    Skin Tag Removal Procedure Note Diagnosis: normal skin tags Location: Multiple including left axilla, left abdomen under the breast and left lower abdomen, under right breast, lower right abdomen, left inner thigh Informed Consent: Discussed risks (permanent scarring, infection, pain, bleeding, bruising, redness, and recurrence of the lesion) and benefits of the procedure, as well as the alternatives. She is aware that skin tags are benign lesions, and their removal is often not considered medically necessary. Informed consent was obtained. Preparation: The area was prepared in a standard fashion. Anesthesia: antiseptic spray Procedure Details: Iris scissors were used to perform sharp removal. Aluminum chloride was applied for hemostasis. Ointment and bandage were applied where needed. The patient tolerated the procedure well. Total number of lesions treated: 12 Plan: The patient was instructed on post-op care. Recommend OTC analgesia as needed for pain.   ASSESSMENT/PLAN:   Skin tag Removal of 12 skin tags, patient will return to care for further skin tag removals as necessary.      04/29/2020, DO [facility]

## 2020-06-12 MED ORDER — ETONOGESTREL 68 MG ~~LOC~~ IMPL
68.0000 mg | DRUG_IMPLANT | Freq: Once | SUBCUTANEOUS | Status: AC
  Administered 2020-05-17: 68 mg via SUBCUTANEOUS

## 2020-06-12 NOTE — Addendum Note (Signed)
Addended by: Veronda Prude on: 06/12/2020 09:28 AM   Modules accepted: Orders

## 2020-06-15 ENCOUNTER — Ambulatory Visit: Payer: No Typology Code available for payment source | Admitting: Family Medicine

## 2020-06-18 ENCOUNTER — Ambulatory Visit (INDEPENDENT_AMBULATORY_CARE_PROVIDER_SITE_OTHER): Payer: 59 | Admitting: Family Medicine

## 2020-06-18 ENCOUNTER — Encounter: Payer: Self-pay | Admitting: Family Medicine

## 2020-06-18 ENCOUNTER — Other Ambulatory Visit: Payer: Self-pay

## 2020-06-18 DIAGNOSIS — O99519 Diseases of the respiratory system complicating pregnancy, unspecified trimester: Secondary | ICD-10-CM

## 2020-06-18 DIAGNOSIS — E669 Obesity, unspecified: Secondary | ICD-10-CM | POA: Diagnosis not present

## 2020-06-18 DIAGNOSIS — M545 Low back pain, unspecified: Secondary | ICD-10-CM

## 2020-06-18 DIAGNOSIS — G8929 Other chronic pain: Secondary | ICD-10-CM

## 2020-06-18 DIAGNOSIS — I10 Essential (primary) hypertension: Secondary | ICD-10-CM

## 2020-06-18 DIAGNOSIS — M25532 Pain in left wrist: Secondary | ICD-10-CM | POA: Diagnosis not present

## 2020-06-18 DIAGNOSIS — J45909 Unspecified asthma, uncomplicated: Secondary | ICD-10-CM

## 2020-06-18 DIAGNOSIS — G43109 Migraine with aura, not intractable, without status migrainosus: Secondary | ICD-10-CM

## 2020-06-18 HISTORY — DX: Pain in left wrist: M25.532

## 2020-06-18 LAB — POCT GLYCOSYLATED HEMOGLOBIN (HGB A1C): Hemoglobin A1C: 5.4 % (ref 4.0–5.6)

## 2020-06-18 MED ORDER — ALBUTEROL SULFATE HFA 108 (90 BASE) MCG/ACT IN AERS
2.0000 | INHALATION_SPRAY | Freq: Four times a day (QID) | RESPIRATORY_TRACT | 2 refills | Status: DC | PRN
Start: 2020-06-18 — End: 2021-02-11

## 2020-06-18 MED ORDER — AMLODIPINE BESYLATE 10 MG PO TABS: 10.0000 mg | ORAL_TABLET | Freq: Every day | ORAL | 3 refills | Status: DC

## 2020-06-18 MED ORDER — SUMATRIPTAN SUCCINATE 50 MG PO TABS: 50.0000 mg | ORAL_TABLET | Freq: Every day | ORAL | 3 refills | Status: DC | PRN

## 2020-06-18 NOTE — Assessment & Plan Note (Addendum)
Will check for DM.  We might try semaglutide on her and warned her that insurance may not pay.  Given lab results.  Patient requests semaglutide for weight management.  Rx sent.  Will need dose increased in 4 weeks she is able to afford.

## 2020-06-18 NOTE — Progress Notes (Signed)
    SUBJECTIVE:   CHIEF COMPLAINT / HPI:   Multiple issues: 1. Left wrist pain worsening over past several months.  No fall or other injury.  No hx of gout.  She has noted worsening swelling. 2. Lumps on back.  Present for 9-10 years.  Recently the lumps have become painful.  Bilateral Left >Right.  She did have an old I&D of an abscess over the left painful area. 3. Obesity.  Slow weight gain.  She is frustrated by her inability to lose weight despite changes in diet and increased exercise.  Asks about wt loss medication.  Someone told her about semaglutide specifically.  She is not known to be diabetic.  Did have gestational DM.  Does have FHx + for DM.  BMI =38.  Has one weight related illness (HBP) but not two so does not qualify as morbic obesity.     OBJECTIVE:   BP (!) 132/98   Pulse 78   Ht 5\' 9"  (1.753 m)   Wt 259 lb 3.2 oz (117.6 kg)   SpO2 97%   BMI 38.28 kg/m   Left wrist with dorsal swelling at midline consistent with ganglion. Back: She pointed out the "painful lumps" to me as being located at illiac crests posteriorly.  Discomfort was in the adpose/subcu tissue.  I could not tell by exam if this was normal adipose tissue or two lipomas.  ASSESSMENT/PLAN:   Left wrist pain With symptomatic ganglion cyst.  Refer to hand surgery.  Chronic bilateral low back pain without sciatica Unclear etiology.  Possible symptomatic lipomas.  Possibly normal for her fat distribution with other cause of pain (e.g. fibromyalgia.)  Focus on weight loss for now.    Obesity, Class II, BMI 35-39.9 Will check for DM.  We might try semaglutide on her and warned her that insurance may not pay.     , MD [facility]

## 2020-06-18 NOTE — Assessment & Plan Note (Signed)
With symptomatic ganglion cyst.  Refer to hand surgery.

## 2020-06-18 NOTE — Patient Instructions (Signed)
I will call with lab test results. Someone should call about the hand surgery referral. It may not be easy to get your insurance to cover the semiglutide, especially if you do not have diabetes.  We can try. Your back is likely lipomas - benign fatty tumors.  Lets focus on the weight loss to see if that helps.

## 2020-06-18 NOTE — Assessment & Plan Note (Signed)
Unclear etiology.  Possible symptomatic lipomas.  Possibly normal for her fat distribution with other cause of pain (e.g. fibromyalgia.)  Focus on weight loss for now.

## 2020-06-19 ENCOUNTER — Other Ambulatory Visit: Payer: Self-pay | Admitting: Family Medicine

## 2020-06-19 DIAGNOSIS — E669 Obesity, unspecified: Secondary | ICD-10-CM

## 2020-06-19 LAB — LIPID PANEL
Chol/HDL Ratio: 3.6 ratio (ref 0.0–4.4)
Cholesterol, Total: 165 mg/dL (ref 100–199)
HDL: 46 mg/dL (ref >39)
LDL Chol Calc (NIH): 102 mg/dL — ABNORMAL HIGH (ref 0–99)
Triglycerides: 94 mg/dL (ref 0–149)
VLDL Cholesterol Cal: 17 mg/dL (ref 5–40)

## 2020-06-19 LAB — URIC ACID: Uric Acid: 4.9 mg/dL (ref 2.6–6.2)

## 2020-06-19 MED ORDER — SEMAGLUTIDE-WEIGHT MANAGEMENT 0.25 MG/0.5ML ~~LOC~~ SOAJ: 0.2500 mg | SUBCUTANEOUS | 0 refills | Status: DC

## 2020-06-19 NOTE — Addendum Note (Signed)
Addended by: [name] on: 06/19/2020 03:49 PM   Modules accepted: Orders

## 2020-06-27 ENCOUNTER — Ambulatory Visit: Payer: Self-pay

## 2020-06-27 ENCOUNTER — Ambulatory Visit (INDEPENDENT_AMBULATORY_CARE_PROVIDER_SITE_OTHER): Payer: 59 | Admitting: Orthopaedic Surgery

## 2020-06-27 ENCOUNTER — Encounter: Payer: Self-pay | Admitting: Orthopaedic Surgery

## 2020-06-27 ENCOUNTER — Other Ambulatory Visit: Payer: Self-pay

## 2020-06-27 DIAGNOSIS — M25532 Pain in left wrist: Secondary | ICD-10-CM | POA: Diagnosis not present

## 2020-06-27 MED ORDER — TRAMADOL HCL 50 MG PO TABS: 100.0000 mg | ORAL_TABLET | Freq: Four times a day (QID) | ORAL | 0 refills | Status: DC | PRN

## 2020-06-27 MED ORDER — PREDNISONE 50 MG PO TABS
ORAL_TABLET | ORAL | 0 refills | Status: DC
Start: 2020-06-27 — End: 2020-09-13

## 2020-06-27 NOTE — Progress Notes (Signed)
Office Visit Note   Patient: Janet [name]           Date of Birth: 03/20/1985           MRN: [id number] Visit Date: 06/27/2020              Requested by: [name], MD [address] [place],  [place] [address] PCP: [name], DO   Assessment & Plan: Visit Diagnoses:  1. Pain in left wrist     Plan: I would like to try to be as conservative as possible at first.  Working to try a Velcro wrist splint that she can wear as well as a topical Voltaren gel which I think will be safer for her.  She can get this over-the-counter at [facility].  I will send in 5 days of prednisone 50 mg since she is not a diabetic and see if this can calm down the inflammation in her wrist.  I will also try some tramadol for some of her acute pain to use sparingly.  I would like to see her back in 2 weeks for repeat exam.  Follow-Up Instructions: Return in about 2 weeks (around 07/11/2020).   Orders:  Orders Placed This Encounter  Procedures   XR Wrist Complete Left   Meds ordered this encounter  Medications   predniSONE (DELTASONE) 50 MG tablet    Sig: Take once daily for 5 days    Dispense:  5 tablet    Refill:  0   traMADol (ULTRAM) 50 MG tablet    Sig: Take 2 tablets (100 mg total) by mouth every 6 (six) hours as needed.    Dispense:  30 tablet    Refill:  0      Procedures: No procedures performed   Clinical Data: No additional findings.   Subjective: Chief Complaint  Patient presents with   Left Wrist - Pain  The patient is a 35 year old female who comes in for worsening left wrist pain and swelling for a month and a half now that is worsened over the last 3 weeks.  There has been no known injury.  She does report some numbness and tingling in her fingers but most of her pain is in the dorsum of the left wrist and hand and it does radiate up the arm.  She says when she flexes or extends her wrist or stretch her fingers out she does get pain.  She cannot take  anti-inflammatories for other medical reasons.  She has tried Tylenol but that has not helped.  She has not had any type of injections or other treatment or any therapy for this situation.  This is not her dominant side.  The pain is waking her up at night as well.  HPI  Review of Systems He is currently listed no fever, chills, nausea, vomiting  Objective: Vital Signs: There were no vitals taken for this visit.  Physical Exam She is alert and oriented and in no acute distress Ortho Exam She is having obvious left wrist pain with a lot of guarding with palpation along the dorsum of the wrist as well as flexion extension of the left wrist.  Her hand is well-perfused.  She is in obvious discomfort.  There is no irregularities of the skin and not a significant mount of swelling that I can see when I compared to the other side but there is some swelling. Specialty Comments:  No specialty comments available.  Imaging: XR Wrist Complete Left  Result Date: 06/27/2020 3 views of the left wrist show no acute findings.  There is slight ulnar negative variance.  The carpal bones and alignment appear normal.  There is no evidence of fracture.    PMFS History: Patient Active Problem List   Diagnosis Date Noted   Left wrist pain 06/18/2020   Skin tag 05/11/2020   Dizziness 04/24/2020   Essential hypertension 04/24/2020   Depression, recurrent (HCC) 12/20/2019   Vitamin D deficiency 12/20/2019   Insomnia 12/20/2019   Tension headache 06/15/2019   Hot flashes 04/06/2019   Current moderate episode of major depressive disorder without prior episode (HCC) 04/06/2019   Psychophysiological insomnia 04/06/2019   Chronic bilateral low back pain without sciatica 11/26/2017   Right wrist pain 11/26/2017   Finger pain, left 11/26/2017   Contracture of finger joint, 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   Finger deformity, acquired, right 02/27/2017   Fat pad 02/27/2017   Benign paroxysmal positional vertigo 02/14/2017    Asthma 07/24/2016   Obesity, Class II, BMI 35-39.9 06/27/2016   Migraine 06/12/2016   Sickle cell trait (HCC) 05/15/2016   History of gestational hypertension 04/24/2016   Past Medical History:  Diagnosis Date   Anemia    Asthma    Depression    Gestational diabetes mellitus, antepartum 08/12/2016   Moved pt to [facility] GDM program Growth [place] at 38 week EFW 7#13oz, 82nd%   Headache    migraines   History of cesarean delivery 04/24/2016   Twins, breech, 39m and pt not told she needs rpt. Pt told she's eligible for TOLAC; she desires RCS.     Family History  Problem Relation Age of Onset   Hypertension Mother    Heart disease Mother    Diabetes Father    Breast cancer Maternal Aunt 2    Past Surgical History:  Procedure Laterality Date   CESAREAN SECTION     CESAREAN SECTION N/A 10/17/2016   Procedure: REPEAT CESAREAN SECTION;  Surgeon: Adam Phenix, MD;  Location: [facility];  Service: Obstetrics;  Laterality: N/A;   DILATION AND CURETTAGE OF UTERUS     Social History   Occupational History   Not on file  Tobacco Use   Smoking status: Never   Smokeless tobacco: Never  Vaping Use   Vaping Use: Never used  Substance and Sexual Activity   Alcohol use: Yes    Comment: once every few months   Drug use: No   Sexual activity: Yes    Partners: Male    Birth control/protection: Implant

## 2020-06-28 ENCOUNTER — Ambulatory Visit: Payer: No Typology Code available for payment source

## 2020-09-04 NOTE — Progress Notes (Signed)
SUBJECTIVE:   CHIEF COMPLAINT / HPI:   Ms. Janet [name] is a 35 yo F who presents for the below.   Knee pain Right knee pain ongoing for 2 weeks denies injury with right has a history of left knee injury and osteoarthritis.  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  She has tried ice with some pain relief and Tylenol but has not helped.    Left middle finger swelling Intermittently over 1 year of pain but swelling is new.  Endorses fevers as above.  Has tried icy hot and cold spray without relief.  Unable to bend as to why she sought care today.  Denies any known injury or trauma to this finger. Endorses occasional fevers temps of 101 that resolves with Tylenol.  Denies other B symptoms.  Depression Continues to endorse depression without SI/HI.  Was put on Celexa prior and had worsening depressive symptoms on Celexa.  Her weight is the main source of her depression.  States she would feel comfortable talking to someone prior to her starting on the medication.  PERTINENT  PMH / PSH: HTN, asthma, sickle cell trait, contracture of finger joint on the right hand, history of migraine  OBJECTIVE:   BP (!) 132/99   Pulse 77   Wt 258 lb (117 kg)   SpO2 100%   BMI 38.10 kg/m   PHQ9 SCORE ONLY 09/05/2020 06/18/2020 05/17/2020  PHQ-9 Total Score 18 18 1     General: Appears well, no acute distress. Age appropriate. Cardiac: RRR, normal heart sounds, no murmurs Respiratory: CTAB, normal effort MSK: Right knee exam No gross deformity, ecchymoses, effusion.  TTP over right vastus lateralis muscle at distal end. FROM with normal strength. Negative ant/post drawers. Negative valgus/varus testing. Negative lachman.  Negative mcmurrays, apleys.  NV intact distally.  Left 3rd digit exam No gross derformity, ecchymoses, edema present at PIP only PIP TTP.  Decreased ROM with finger flexion.  No  joint laxity or instability.    ASSESSMENT/PLAN:    Acute pain of right knee 2 weeks. No known injury. Hx of OA in left knee. Works as a and does patient transfers. Tenderness located over vastus lateralis muscle as it inserts around the knee. Otherwise no joint tenderness. Bedside U/S performed with Dr. Lawyer without joint effusion and menisci unremarkable. Evidence of fluid in vastus lateralis muscle. Suspect OA and/or MSK overuse injury. Patient has normal gait suspicion for fracture low.   - DG Knee Complete 4 Views Right; Future - Supportive care: knee brace and ice - diclofenac Sodium (VOLTAREN) 1 % GEL; Apply 2 g topically 4 (four) times daily.  Dispense: 50 g; Refill: 0 - Consider PT  Finger pain, left Intermittent for 1 year now with edema and fever. Afebrile today. Edema present at left PIP 3rd digit. Tender to palpation but without warmth or erythema. Given hx consider RA or other possible autoimmune disorders. Although no known injury will obtain plain film.  - DG Finger Middle Left; Future - C-reactive protein - Sedimentation Rate - Rheumatoid factor - HIV antibody (with reflex) - HCV Ab w Reflex to Quant PCR - CYCLIC CITRUL PEPTIDE ANTIBODY, IGG/IGA - ANA - diclofenac Sodium (VOLTAREN) 1 % GEL; Apply 2 g topically 4 (four) times daily.  Dispense: 50 g; Refill: 0  Depression, recurrent Elevated PHQ9, prior medication of celexa. Negative SI/HI. Endorsing  depression around weight gain/body image. Discussed psychology prior to medication management. Would like to talk about this more in depth with her at follow up.  -psych resource given (see AVS) -f/u 2 weeks    Lavonda Jumbo, DO [facility]

## 2020-09-05 ENCOUNTER — Ambulatory Visit (INDEPENDENT_AMBULATORY_CARE_PROVIDER_SITE_OTHER): Payer: 59 | Admitting: Family Medicine

## 2020-09-05 ENCOUNTER — Other Ambulatory Visit: Payer: Self-pay

## 2020-09-05 ENCOUNTER — Ambulatory Visit
Admission: RE | Admit: 2020-09-05 | Discharge: 2020-09-05 | Disposition: A | Discharge status: Home / Self Care | Payer: 59 | Source: Ambulatory Visit | Attending: Family Medicine | Admitting: Family Medicine

## 2020-09-05 VITALS — BP 132/99 | HR 77 | Wt 258.0 lb

## 2020-09-05 DIAGNOSIS — M2540 Effusion, unspecified joint: Secondary | ICD-10-CM

## 2020-09-05 DIAGNOSIS — I10 Essential (primary) hypertension: Secondary | ICD-10-CM

## 2020-09-05 DIAGNOSIS — R509 Fever, unspecified: Secondary | ICD-10-CM

## 2020-09-05 DIAGNOSIS — M79645 Pain in left finger(s): Secondary | ICD-10-CM | POA: Diagnosis not present

## 2020-09-05 DIAGNOSIS — F339 Major depressive disorder, recurrent, unspecified: Secondary | ICD-10-CM

## 2020-09-05 DIAGNOSIS — G43109 Migraine with aura, not intractable, without status migrainosus: Secondary | ICD-10-CM

## 2020-09-05 DIAGNOSIS — M25561 Pain in right knee: Secondary | ICD-10-CM | POA: Diagnosis not present

## 2020-09-05 MED ORDER — DICLOFENAC SODIUM 1 % EX GEL: 2.0000 g | Freq: Four times a day (QID) | CUTANEOUS | 0 refills | Status: DC

## 2020-09-05 MED ORDER — AMLODIPINE BESYLATE 10 MG PO TABS: 10.0000 mg | ORAL_TABLET | Freq: Every day | ORAL | 3 refills | Status: DC

## 2020-09-05 MED ORDER — SUMATRIPTAN SUCCINATE 50 MG PO TABS: 50.0000 mg | ORAL_TABLET | Freq: Every day | ORAL | 3 refills | Status: DC | PRN

## 2020-09-05 NOTE — Patient Instructions (Signed)
Thank you for coming in today.  We discussed right knee pain.  For this we are going to get imaging as we think there may be an injury to the muscle surrounding the knee.  In the meantime you can continue to use your knee brace and ice as well as Voltaren gel.  We also discussed left finger swelling we are concerned that you may have a form of arthritis and we discussed getting labs today.  I will follow-up with you when labs are available.  In the meantime he can continue to use ice as well as Voltaren gel.  We also discussed depression in weight gain surrounding this.  Please go to [email or web address] to find a therapist and we will follow-up in 2 weeks.  Dr. Salvadore Dom

## 2020-09-06 ENCOUNTER — Telehealth: Payer: Self-pay | Admitting: Family Medicine

## 2020-09-06 NOTE — Telephone Encounter (Signed)
Discussed labs and knee imaging with patient. Also discussed finger imaging suggested of possible injury. Patient states she has not injured her finger and has not had a fever since our visit. She still has pain and swelling in that finger. Since labs are normal and not likely RA and no injury etiology remains unknown. Asked patient to schedule follow up for this issue at her earliest convenience she voiced understanding.   Lavonda Jumbo, DO 09/06/2020, 4:07 PM PGY-3, [facility]

## 2020-09-07 LAB — SEDIMENTATION RATE: Sed Rate: 2 mm/hr (ref 0–32)

## 2020-09-07 LAB — HIV ANTIBODY (ROUTINE TESTING W REFLEX): HIV Screen 4th Generation wRfx: NONREACTIVE

## 2020-09-07 LAB — C-REACTIVE PROTEIN: CRP: 2 mg/L (ref 0–10)

## 2020-09-07 LAB — CYCLIC CITRUL PEPTIDE ANTIBODY, IGG/IGA: Cyclic Citrullin Peptide Ab: 2 units (ref 0–19)

## 2020-09-07 LAB — HCV AB W REFLEX TO QUANT PCR: HCV Ab: <0.1 s/co ratio (ref 0.0–0.9)

## 2020-09-07 LAB — RHEUMATOID FACTOR: Rheumatoid fact SerPl-aCnc: <10 IU/mL (ref <14.0)

## 2020-09-07 LAB — ANA: Anti Nuclear Antibody (ANA): NEGATIVE

## 2020-09-07 LAB — HCV INTERPRETATION

## 2020-09-13 ENCOUNTER — Other Ambulatory Visit: Payer: Self-pay

## 2020-09-13 ENCOUNTER — Ambulatory Visit (INDEPENDENT_AMBULATORY_CARE_PROVIDER_SITE_OTHER): Payer: 59 | Admitting: Family Medicine

## 2020-09-13 ENCOUNTER — Encounter: Payer: Self-pay | Admitting: Family Medicine

## 2020-09-13 VITALS — BP 144/98 | HR 74 | Wt 258.4 lb

## 2020-09-13 DIAGNOSIS — M1230 Palindromic rheumatism, unspecified site: Secondary | ICD-10-CM | POA: Diagnosis not present

## 2020-09-13 MED ORDER — PREDNISONE 20 MG PO TABS: 20.0000 mg | ORAL_TABLET | Freq: Every day | ORAL | 0 refills | Status: DC

## 2020-09-13 NOTE — Assessment & Plan Note (Signed)
Referral to rheumatology Will prescribe 5-day course of 20 mg prednisone Patient to follow-up in 1 week

## 2020-09-13 NOTE — Progress Notes (Signed)
    SUBJECTIVE:   CHIEF COMPLAINT / HPI: Follow-up for knee pain and finger swelling  Knee pain Patient reports that she has been having alternating right and left knee pain.  She reports her current pain is in her right knee.  She reports that this is associated with swelling and having any pain with increased pressure on her knees.  Patient reports that she is unable to use any kind of squatting.  Patient reports that she has been prescribed steroids before they did not help with the swelling.  She has never had steroid injections into her knee joint.  She states that pain is worse with walking and is usually alleviated with sitting.  She reports that she is unable to take any NSAIDs and Tylenol has not helped.  She describes her pain as sharp.  She denies any recent trauma to her knees.  Finger swelling Patient reports that her left middle finger has been swollen and is progressively swelling.  She states that she is having difficulty with holding objects in her left hand.  She reports that she has had pain for close to 1 year but the swelling is more recent and a new problem.  She states that she has no fevers at this time but has had intermittent fevers and she feels exceptionally tired.  She denies any chills or recent weight loss.  She states that the swelling is only present in her middle finger.  She denies any pain in her wrist or other fingers on the left hand.  Patient had HIV, CCP, rheumatoid factor, ANA completed at previous visit on 09/05/2020 with negative results.  She also had x-ray that showed soft tissue swelling but no acute fractures.  PERTINENT  PMH / PSH:  Knee pain Finger swelling  OBJECTIVE:   BP (!) 144/98   Pulse 74   Wt 258 lb 6.4 oz (117.2 kg)   SpO2 100%   BMI 38.16 kg/m   Knee: - Inspection:  swelling/effusion, no erythema nor bruising. 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

## 2020-09-13 NOTE — Patient Instructions (Addendum)
I will prescribe a course of steroids for the next 5 days.  I recommend attempting to ice your knee and finger as tolerated.  Please follow-up with [place] in 1-2 weeks.  I will send a referral to rheumatology as I think you are having inflammatory rheumatoid-like symptoms that may need a special medication that only they can prescribe.  Please be on the look out for a call from rheumatology in order to schedule an appointment in the next few weeks.

## 2020-10-21 NOTE — Progress Notes (Signed)
Office Visit Note  Patient: Janet [name]             Date of Birth: 01/15/1985           MRN: [id number]             PCP: Donney Dice, DO Referring: Kinnie Feil, MD Visit Date: 10/22/2020 Occupation: CNA  Subjective:  New Patient (Initial Visit) (Joint swelling and pain bil knees and left middle finger)   History of Present Illness: Janet [name] is a 35 y.o. female for evaluation for palindromic rheumatism. She has recurrent right and left knee pain and swelling ongoing for a few years  with more recent development of left 3rd finger swelling for about 2.5 years total at this site. Xrays showed soft tissue swelling in the finger and mild tricompartmental knee OA in both knees. Pain is worst at the end of the day. She cannot recall provocative infections or injuries at any of these areas. She has had a large weight gain of about 60 lbs since her last pregnancy in 2018 and she wonders if nexplanon hormonal contraception is contributory. She does not tolerate taking NSAIDs well past allergic type reaction. Tylenol does not help her symptoms very well. She was prescribed a week of 20 mg prednisone for symptoms on multiple occasions this helps her swelling and pain a lot but symptoms just return after completing each time.  Labs reviewed 08/2020 ANA neg RF neg CCP neg ESR 2 CRP 2 HIV neg HCV neg   Activities of Daily Living:  Patient reports morning stiffness for 45 minutes.   Patient Reports nocturnal pain.  Difficulty dressing/grooming: Reports Difficulty climbing stairs: Reports Difficulty getting out of chair: Reports Difficulty using hands for taps, buttons, cutlery, and/or writing: Reports  Review of Systems  Constitutional:  Positive for fatigue.  HENT:  Negative for mouth dryness.   Eyes:  Negative for dryness.  Respiratory:  Positive for shortness of breath.   Cardiovascular:  Negative for swelling in legs/feet.  Gastrointestinal:  Positive for constipation.   Endocrine: Positive for cold intolerance.  Genitourinary:  Negative for difficulty urinating.  Musculoskeletal:  Positive for joint pain, gait problem, joint pain, joint swelling, muscle weakness, morning stiffness and muscle tenderness.  Skin:  Negative for rash.  Allergic/Immunologic: Negative for susceptible to infections.  Neurological:  Positive for numbness and weakness.  Hematological:  Negative for bruising/bleeding tendency.  Psychiatric/Behavioral:  Positive for sleep disturbance.    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   Left wrist pain 06/18/2020   Skin tag 05/11/2020   Dizziness 04/24/2020   Essential hypertension 04/24/2020   Depression, recurrent ([place]) 12/20/2019   Vitamin D deficiency 12/20/2019   Insomnia 12/20/2019   Tension headache 06/15/2019   Hot flashes 04/06/2019   Current moderate episode of major depressive disorder without prior episode ([place]) 04/06/2019   Psychophysiological insomnia 04/06/2019   Chronic bilateral low back pain without sciatica 11/26/2017   Right wrist pain 11/26/2017   Finger pain, left 11/26/2017   Contracture of finger joint, 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   Finger deformity, acquired, right 02/27/2017   Fat pad 02/27/2017   Benign paroxysmal positional vertigo 02/14/2017   Asthma 07/24/2016   Obesity, Class II, BMI 35-39.9 06/27/2016   Migraine 06/12/2016   Sickle cell trait ([place]) 05/15/2016   History of gestational hypertension 04/24/2016    Past Medical History:  Diagnosis Date  Anemia    Asthma    Depression    Gestational diabetes mellitus, antepartum 08/12/2016   Moved pt to [place] GDM program Growth [place] at 45 week EFW 7#13oz, 82nd%   Headache    migraines   History of cesarean delivery 04/24/2016   Twins, breech, 47mand pt not told she needs rpt. Pt told she's eligible for TOLAC; she desires RCS.     Family History  Problem Relation  Age of Onset   Hypertension Mother    Heart disease Mother    Diabetes Father    Breast cancer Maternal Aunt [phone]  Past Surgical History:  Procedure Laterality Date   CESAREAN SECTION     CESAREAN SECTION N/A 10/17/2016   Procedure: REPEAT CESAREAN SECTION;  Surgeon: AWoodroe Mode MD;  Location: [facility]  Service: Obstetrics;  Laterality: N/A;   DILATION AND CURETTAGE OF UTERUS     Social History   Social History Narrative   04/06/19   [place]   Living: with [name] (husband since 2017)   Work: stay at home mom      Family: DElenore Rotaand DOptician, dispensing(twins 2010) and BOris Drone(2019), and 3 step children - Amblessed, holy, LCicero Duck     Enjoys: sing professionally, reading      Exercise: goes to the gym when she can - 1-2 times a week   Diet: keto diet      Safety   Seat belts: Yes    Guns: No   Safe in relationships: Yes    Immunization History  Administered Date(s) Administered   Influenza,inj,Quad PF,6+ Mos 10/08/2016, 11/19/2017, 12/20/2019   PFIZER(Purple Top)SARS-COV-2 Vaccination 04/14/2019, 05/14/2019, 12/20/2019   Tdap 07/24/2016     Objective: Vital Signs: BP 122/79 (BP Location: Right Arm, Patient Position: Sitting, Cuff Size: Normal)   Pulse 68   Resp 14   Ht _0  (1.753 m)   Wt 262 lb (118.8 kg)   BMI 38.69 kg/m    Physical Exam Constitutional:      Appearance: She is obese.  HENT:     Right Ear: External ear normal.     Left Ear: External ear normal.  Cardiovascular:     Rate and Rhythm: Normal rate and regular rhythm.  Pulmonary:     Effort: Pulmonary effort is normal.     Breath sounds: Normal breath sounds.  Musculoskeletal:     Right lower leg: No edema.     Left lower leg: No edema.  Skin:    General: Skin is warm and dry.     Findings: No rash.  Neurological:     General: No focal deficit present.     Mental Status: She is alert.     Deep Tendon Reflexes: Reflexes normal.  Psychiatric:         Mood and Affect: Mood normal.     Musculoskeletal Exam:  Shoulders full ROM no tenderness or swelling Elbows full ROM no tenderness or swelling Wrists full ROM no tenderness or swelling Left 3rd finger tenderness to pressure at PIP and DIP joints, decreased flexion ROM with guarding, no palpable synovitis, limited ultrasound inspection negative for effusion or color doppler enhancement, other digits normal Knees full ROM, tenderness to pressure over medial and lateral joint line, patellofemoral crepitus present, no palpable effusions Ankles full ROM no tenderness or swelling    Investigation: No additional findings.  Imaging: No results found.  Recent Labs: Lab Results  Component  Value Date   WBC 5.4 04/24/2020   HGB 13.6 04/24/2020   PLT 245 04/24/2020   NA 138 12/20/2019   K 4.3 12/20/2019   CL 105 12/20/2019   CO2 21 12/20/2019   GLUCOSE 103 (H) 12/20/2019   BUN 9 12/20/2019   CREATININE 0.79 12/20/2019   BILITOT 0.5 12/20/2019   ALKPHOS 58 12/20/2019   AST 18 12/20/2019   ALT 16 12/20/2019   PROT 6.8 12/20/2019   ALBUMIN 4.5 12/20/2019   CALCIUM 9.3 12/20/2019   GFRAA 112 12/20/2019    Speciality Comments: No specialty comments available.  Procedures:  No procedures performed Allergies: Flagyl [metronidazole], Ibuprofen, Nsaids, Ondansetron hcl, Other, and Levofloxacin in d5w   Assessment / Plan:     Visit Diagnoses: Palindromic rheumatism - Plan: Sedimentation rate, C-reactive protein Finger pain, left  Intermittent joint swelling in multiple sites but oligoarticular. Negative serology, xrays knee OA finger soft tissue swelling. None present on exam today but has tenderness. Will repeat inflammatory markers for any evidence of active systemic inflammation today. If normal may benefit to f/u again PRN with return of swelling to examine again, nothing to currently indicate DMARD treatment needed.  Chronic pain of both knees  Discussed primary knee OA  management recommendations including weight loss and low impact exercises with proximal leg strengthening. No current inflammatory changes.  Orders: Orders Placed This Encounter  Procedures   Sedimentation rate   C-reactive protein    No orders of the defined types were placed in this encounter.    Follow-Up Instructions: Return if symptoms worsen or fail to improve.   Collier Salina, MD  Note - This record has been created using [employer].  Chart creation errors have been sought, but may not always  have been located. Such creation errors do not reflect on  the standard of medical care.

## 2020-10-22 ENCOUNTER — Ambulatory Visit: Payer: 59 | Admitting: Internal Medicine

## 2020-10-22 ENCOUNTER — Encounter: Payer: Self-pay | Admitting: Internal Medicine

## 2020-10-22 ENCOUNTER — Other Ambulatory Visit: Payer: Self-pay

## 2020-10-22 VITALS — BP 122/79 | HR 68 | Resp 14 | Ht 69.0 in | Wt 262.0 lb

## 2020-10-22 DIAGNOSIS — G8929 Other chronic pain: Secondary | ICD-10-CM

## 2020-10-22 DIAGNOSIS — M1230 Palindromic rheumatism, unspecified site: Secondary | ICD-10-CM

## 2020-10-22 DIAGNOSIS — M25561 Pain in right knee: Secondary | ICD-10-CM | POA: Diagnosis not present

## 2020-10-22 DIAGNOSIS — M25562 Pain in left knee: Secondary | ICD-10-CM

## 2020-10-22 DIAGNOSIS — M79645 Pain in left finger(s): Secondary | ICD-10-CM

## 2020-10-22 HISTORY — DX: Pain in right knee: M25.562

## 2020-10-22 HISTORY — DX: Pain in left knee: M25.561

## 2020-10-22 NOTE — Patient Instructions (Signed)
[place] Erythrocyte Sedimentation Rate Test Why am I having this test? The erythrocyte sedimentation rate (ESR) test is used to help find illnesses related to: Sudden (acute) or long-term (chronic) infections. Inflammation. The body's disease-fighting system attacking healthy cells (autoimmune diseases). Cancer. Tissue death. If you have symptoms that may be related to any of these illnesses, your health care provider may do an ESR test before doing more specific tests. If you have an inflammatory immune disease, such as rheumatoid arthritis, you may have this test to help monitor your therapy. What is being tested? This test measures how long it takes for your red blood cells (erythrocytes) to settle in a solution over a certain amount of time (sedimentation rate). When you have an infection or inflammation, your red blood cells clump together and settle faster. The sedimentation rate provides information about how much inflammation is present in the body. What kind of sample is taken? A blood sample is required for this test. It is usually collected by inserting a needle into a blood vessel. How do I prepare for this test? Follow any instructions from your health care provider about changing or stopping your regular medicines. Tell a health care provider about: Any allergies you have. All medicines you are taking, including vitamins, herbs, eye drops, creams, and over-the-counter medicines. Any blood disorders you have. Any surgeries you have had. Any medical conditions you have, such as thyroid or kidney disease. Whether you are pregnant or may be pregnant. How are the results reported? Your results will be reported as a value that measures sedimentation rate in millimeters per hour (mm/hr). Your health care provider will compare your results to normal ranges that were established after testing a large group of people (reference values). Reference values may vary among labs and hospitals. For  this test, common reference values, which vary by age and gender, are: Newborn: 0-2 mm/hr. Child, up to puberty: 0-10 mm/hr. Female: Under 50 years: 0-20 mm/hr. 50-85 years: 0-30 mm/hr. Over 85 years: 0-42 mm/hr. Female: Under 50 years: 0-15 mm/hr. 50-85 years: 0-20 mm/hr. Over 85 years: 0-30 mm/hr. Certain conditions or medicines may cause ESR levels to be falsely lower or higher, such as: Pregnancy. Obesity. Steroids, birth control pills, and blood thinners. Thyroid or kidney disease. What do the results mean? Results that are within reference values are considered normal, meaning that the level of inflammation in your body is healthy. High ESR levels mean that there is inflammation in your body. You will have more tests to help make a diagnosis. Inflammation may result from many different conditions or injuries. Talk with your health care provider about what your results mean. Questions to ask your health care provider Ask your health care provider, or the department that is doing the test: When will my results be ready? How will I get my results? What are my treatment options? What other tests do I need? What are my next steps? Summary The erythrocyte sedimentation rate (ESR) test is used to help find illnesses associated with sudden (acute) or long-term (chronic) infections, inflammation, autoimmune diseases, cancer, or tissue death. If you have symptoms that may be related to any of these illnesses, your health care provider may do an ESR test before doing more specific tests. If you have an inflammatory immune disease, such as rheumatoid arthritis, you may have this test to help monitor your therapy. This test measures how long it takes for your red blood cells (erythrocytes) to settle in a solution over a certain amount  of time (sedimentation rate). This provides information about how much inflammation is present in the body.  Osteoarthritis Osteoarthritis is a type of  arthritis. It refers to joint pain or joint disease. Osteoarthritis affects tissue that covers the ends of bones in joints (cartilage). Cartilage acts as a cushion between the bones and helps them move smoothly. Osteoarthritis occurs when cartilage in the joints gets worn down. Osteoarthritis is sometimes called "wear and tear" arthritis. Osteoarthritis is the most common form of arthritis. It often occurs in older people. It is a condition that gets worse over time. The joints most often affected by this condition are in the fingers, toes, hips, knees, and spine, including the neck and lower back. What are the causes? This condition is caused by the wearing down of cartilage that covers the ends of bones. What increases the risk? The following factors may make you more likely to develop this condition: Being age 74 or older. Obesity. Overuse of joints. Past injury of a joint. Past surgery on a joint. Family history of osteoarthritis. What are the signs or symptoms? The main symptoms of this condition are pain, swelling, and stiffness in the joint. Other symptoms may include: An enlarged joint. More pain and further damage caused by small pieces of bone or cartilage that break off and float inside of the joint. Small deposits of bone (osteophytes) that grow on the edges of the joint. A grating or scraping feeling inside the joint when you move it. Popping or creaking sounds when you move. Difficulty walking or exercising. An inability to grip items, twist your hand(s), or control the movements of your hands and fingers. How is this diagnosed? This condition may be diagnosed based on: Your medical history. A physical exam. Your symptoms. X-rays of the affected joint(s). Blood tests to rule out other types of arthritis. How is this treated? There is no cure for this condition, but treatment can help control pain and improve joint function. Treatment may include a combination of therapies,  such as: Pain relief techniques, such as: Applying heat and cold to the joint. Massage. A form of talk therapy called cognitive behavioral therapy (CBT). This therapy helps you set goals and follow up on the changes that you make. Medicines for pain and inflammation. The medicines can be taken by mouth or applied to the skin. They include: NSAIDs, such as ibuprofen. Prescription medicines. Strong anti-inflammatory medicines (corticosteroids). Certain nutritional supplements. A prescribed exercise program. You may work with a physical therapist. Assistive devices, such as a brace, wrap, splint, specialized glove, or cane. A weight control plan. Surgery, such as: An osteotomy. This is done to reposition the bones and relieve pain or to remove loose pieces of bone and cartilage. Joint replacement surgery. You may need this surgery if you have advanced osteoarthritis. Follow these instructions at home: Activity Rest your affected joints as told by your health care provider. Exercise as told by your health care provider. He or she may recommend specific types of exercise, such as: Strengthening exercises. These are done to strengthen the muscles that support joints affected by arthritis. Aerobic activities. These are exercises, such as brisk walking or water aerobics, that increase your heart rate. Range-of-motion activities. These help your joints move more easily. Balance and agility exercises. Managing pain, stiffness, and swelling   If directed, apply heat to the affected area as often as told by your health care provider. Use the heat source that your health care provider recommends, such as a moist  heat pack or a heating pad. If you have a removable assistive device, remove it as told by your health care provider. Place a towel between your skin and the heat source. If your health care provider tells you to keep the assistive device on while you apply heat, place a towel between the  assistive device and the heat source. Leave the heat on for 20-30 minutes. Remove the heat if your skin turns bright red. This is especially important if you are unable to feel pain, heat, or cold. You may have a greater risk of getting burned. If directed, put ice on the affected area. To do this: If you have a removable assistive device, remove it as told by your health care provider. Put ice in a plastic bag. Place a towel between your skin and the bag. If your health care provider tells you to keep the assistive device on during icing, place a towel between the assistive device and the bag. Leave the ice on for 20 minutes, 2-3 times a day. Move your fingers or toes often to reduce stiffness and swelling. Raise (elevate) the injured area above the level of your heart while you are sitting or lying down. General instructions Take over-the-counter and prescription medicines only as told by your health care provider. Maintain a healthy weight. Follow instructions from your health care provider for weight control. Do not use any products that contain nicotine or tobacco, such as cigarettes, e-cigarettes, and chewing tobacco. If you need help quitting, ask your health care provider. Use assistive devices as told by your health care provider. Keep all follow-up visits as told by your health care provider. This is important. Where to find more information [employer] of Arthritis and Musculoskeletal and Skin Diseases: www.niams.SouthExposed.es [employer] on Aging: http://kim-miller.com/ American College of Rheumatology: www.rheumatology.org Contact a health care provider if: You have redness, swelling, or a feeling of warmth in a joint that gets worse. You have a fever along with joint or muscle aches. You develop a rash. You have trouble doing your normal activities. Get help right away if: You have pain that gets worse and is not relieved by pain medicine. Summary Osteoarthritis is a type  of arthritis that affects tissue covering the ends of bones in joints (cartilage). This condition is caused by the wearing down of cartilage that covers the ends of bones. The main symptom of this condition is pain, swelling, and stiffness in the joint. There is no cure for this condition, but treatment can help control pain and improve joint function.

## 2020-10-23 LAB — SEDIMENTATION RATE: Sed Rate: 2 mm/h (ref 0–20)

## 2020-10-23 LAB — C-REACTIVE PROTEIN: CRP: 1.3 mg/L (ref <8.0)

## 2020-10-24 ENCOUNTER — Encounter (INDEPENDENT_AMBULATORY_CARE_PROVIDER_SITE_OTHER): Payer: Self-pay

## 2020-11-05 ENCOUNTER — Ambulatory Visit (INDEPENDENT_AMBULATORY_CARE_PROVIDER_SITE_OTHER): Payer: 59 | Admitting: Family Medicine

## 2020-11-05 ENCOUNTER — Other Ambulatory Visit: Payer: Self-pay

## 2020-11-05 ENCOUNTER — Encounter: Payer: Self-pay | Admitting: Family Medicine

## 2020-11-05 VITALS — BP 124/78 | HR 67 | Wt 261.0 lb

## 2020-11-05 DIAGNOSIS — I10 Essential (primary) hypertension: Secondary | ICD-10-CM | POA: Diagnosis not present

## 2020-11-05 DIAGNOSIS — Z23 Encounter for immunization: Secondary | ICD-10-CM | POA: Diagnosis not present

## 2020-11-05 DIAGNOSIS — Z6838 Body mass index (BMI) 38.0-38.9, adult: Secondary | ICD-10-CM | POA: Diagnosis not present

## 2020-11-05 NOTE — Patient Instructions (Signed)
It was great seeing you today!  Today we discussed weight gain. Some of it may be due to nexplanon although everyone reacts to it differently. But as we discussed, nutrition is a key aspect to helping you reach your goal. While you are waiting to be seen by [facility], I have placed a referral to be seen by our clinic nutritionist at [facility], Dr. Gerilyn Pilgrim.   Please follow up at your next scheduled appointment in 3 months, if anything arises between now and then, please don't hesitate to contact our office.   Thank you for allowing [place] to be a part of your medical care!  Thank you, Dr. Robyne Peers

## 2020-11-05 NOTE — Assessment & Plan Note (Signed)
-  BP 124/78, at goal -continue amlodipine  -diet and exercise counseling provided

## 2020-11-05 NOTE — Assessment & Plan Note (Signed)
-  contraception counseling provided -explained to patient that she would most benefit from further lifestyle modifications and nutrition discussion, as this would need to be adjusted regardless of switching forms of birth control -through shared decision making, determined to stay on nexplanon and further discuss nutrition, handout provided -awaiting [facility], referral previously placed -in the meantime, referred to nutritionist Dr. Gerilyn Pilgrim  -reassurance provided -follow up in 3 months to determine progress

## 2020-11-05 NOTE — Progress Notes (Signed)
    SUBJECTIVE:   CHIEF COMPLAINT / HPI:   Patient presents for concern of recent nexplanon. She recently had it placed in May 2022, prior to this had used nexplanon as a form of contraception for 3 years prior to May. But she states that she is thinking about removing it and is unsure of what to do at this time as she is unhappy with her current weight. Endorsing 40 pounds of weight gain over the past few months. She has been on a keto diet for 3 years and exercises regularly by going to the gym multiple times a week but still is disappointed that she is gaining weight despite all her efforts and lifestyle modifications. Has had the IUD placed years ago and does not want to try that as she experienced excruciating pain.   Pertinent Past Medical History   Hypertension Compliant on amlodipine daily, tolerating medication well without concerns. Denies chest pain and dyspnea. Eating a balanced diet and exercising regularly.   OBJECTIVE:   BP 124/78   Pulse 67   Wt 261 lb (118.4 kg)   SpO2 99%   BMI 38.54 kg/m   General: Patient well-appearing, in no acute distress. HEENT: non-tender thyroid CV: RRR, no murmurs or gallops auscultated  Resp: CTAB Ext: radial pulses strong and equal bilaterally, no LE edema noted bilaterally Psych: mood appropriate   ASSESSMENT/PLAN:   Essential hypertension -BP 124/78, at goal -continue amlodipine  -diet and exercise counseling provided   BMI 38.0-38.9,adult -contraception counseling provided -explained to patient that she would most benefit from further lifestyle modifications and nutrition discussion, as this would need to be adjusted regardless of switching forms of birth control -through shared decision making, determined to stay on nexplanon and further discuss nutrition, handout provided -awaiting [facility], referral previously placed -in the meantime, referred to nutritionist Dr. Gerilyn Pilgrim  -reassurance  provided -follow up in 3 months to determine progress      Janet Sapien Robyne Peers, DO [facility]

## 2020-12-20 ENCOUNTER — Other Ambulatory Visit: Payer: Self-pay

## 2020-12-20 ENCOUNTER — Ambulatory Visit (INDEPENDENT_AMBULATORY_CARE_PROVIDER_SITE_OTHER): Payer: No Typology Code available for payment source | Admitting: Family Medicine

## 2020-12-20 VITALS — Wt 259.6 lb

## 2020-12-20 DIAGNOSIS — E669 Obesity, unspecified: Secondary | ICD-10-CM

## 2020-12-20 NOTE — Progress Notes (Signed)
  What do you want to get out of meeting with me today? Develop plan to lose weight to improve her hypertension.   Relevant history/background: Past medical history of hypertension and elevated BMI. Height: 5'9" Current weight: 259.6 lbs  Assessment:  On a normal day, how many meals do you have? 1 large meal.  How many snacks do you eat per day? 1. What beverages do you drink most days of the week? Hibiscus tea and water.   What foods do you eat most days of the week? Quinoa, brown rice, salmon, [place], salad, vegetables.   Usual physical activity: gym (treadmill, elliptical and resistance training) and walks . Sleep: Patient estimates average of 4 hours of sleep/night.  24-hr recall:  (Up at 6 AM) B ( AM)-  None Snk ( AM)-  None L (1 PM)-  1 cup pecans, 2 cups hibiscus tea (uses about 2 tablespoons in 84 ounces which she prepares at once)  Snk ( PM)-  None D (3 PM)-  Mixed spring salad, sauteed onions, 6 ounce salmon, skinny girl dressing (2 tablespoons) Snk (7 PM)-  Cucumber  Typical day? Yes.     Intervention: Completed diet and exercise history, and established: - SMART behavioral goals (Specific, Measurable, Action-oriented, Realistic, Time-based.)    1. Eat at least 3 times a day, including protein when you can.    2. Document number of hours of sleep nightly.   - How will you document progress on goals?  Recording in a notebook or phone.   For recommendations and goals, see Patient Instructions.    Follow-up: 4 weeks.    [name]  PGY-2  FM

## 2020-12-20 NOTE — Patient Instructions (Addendum)
It was great seeing you today! Below are the goals that we discussed:    1. Eat at least 3 times a day, including protein when you can.    2. Document number of hours of sleep nightly.   Please make sure to record blood pressure daily and bring this log into your next visit.   Please follow up in 4 weeks with Dr. Robyne Peers

## 2021-01-01 ENCOUNTER — Ambulatory Visit: Payer: No Typology Code available for payment source | Admitting: Family Medicine

## 2021-01-09 ENCOUNTER — Other Ambulatory Visit: Payer: Self-pay

## 2021-01-09 ENCOUNTER — Ambulatory Visit (INDEPENDENT_AMBULATORY_CARE_PROVIDER_SITE_OTHER): Payer: No Typology Code available for payment source | Admitting: Family Medicine

## 2021-01-09 ENCOUNTER — Other Ambulatory Visit (HOSPITAL_COMMUNITY)
Admission: RE | Admit: 2021-01-09 | Discharge: 2021-01-09 | Disposition: A | Discharge status: Home / Self Care | Payer: 59 | Source: Ambulatory Visit | Attending: Family Medicine | Admitting: Family Medicine

## 2021-01-09 ENCOUNTER — Encounter: Payer: Self-pay | Admitting: Family Medicine

## 2021-01-09 ENCOUNTER — Ambulatory Visit (INDEPENDENT_AMBULATORY_CARE_PROVIDER_SITE_OTHER): Payer: No Typology Code available for payment source

## 2021-01-09 VITALS — BP 130/94 | HR 75 | Ht 69.0 in | Wt 260.5 lb

## 2021-01-09 DIAGNOSIS — N898 Other specified noninflammatory disorders of vagina: Secondary | ICD-10-CM | POA: Insufficient documentation

## 2021-01-09 DIAGNOSIS — I10 Essential (primary) hypertension: Secondary | ICD-10-CM | POA: Diagnosis not present

## 2021-01-09 DIAGNOSIS — Z23 Encounter for immunization: Secondary | ICD-10-CM | POA: Diagnosis not present

## 2021-01-09 HISTORY — DX: Other specified noninflammatory disorders of vagina: N89.8

## 2021-01-09 NOTE — Assessment & Plan Note (Addendum)
-  BP 130/94, close to goal  -continue amlodipine daily, no addition to current regimen -extensively discussed proper guidelines for checking BP at home, instructed to maintain a more accurate log and bring this to the next visit -follow up with PCP in 1 month

## 2021-01-09 NOTE — Progress Notes (Signed)
° ° °  SUBJECTIVE:   CHIEF COMPLAINT / HPI:   Patient reports 1 month duration of white vaginal discharge with associated odor and mild itching. Denies other associated symptoms. She is sexually active with 1 female partner. For contraception, she is on nexplanon since 2019 and then had it replaced again this year.   PERTINENT  PMH / PSH:   Hypertension Presents for BP check, has been checking her pressures at home which typically range around 140/80. Endorsing compliance on amlodipine 10 mg daily. Denies chest pain, dyspnea and leg swelling.   OBJECTIVE:   BP (!) 130/94    Pulse 75    Ht 5\' 9"  (1.753 m)    Wt 260 lb 8 oz (118.2 kg)    SpO2 100%    BMI 38.47 kg/m   General: Patient well-appearing, in no acute distress. CV: RRR, no murmurs or gallops auscultated Resp: CTAB Abdomen: soft, nontender, nondistended, presence of bowel sounds GU: thin white cervical discharge noted, normal labia, no bleeding noted, no rashes or external lesions noted  Psych: mood appropriate   Wet prep performed in the presence of chaperone  ASSESSMENT/PLAN:   Vaginal discharge -per patient request, wet prep performed including GC/Chlamydia testing. Will notify patient of any abnormal results    Essential hypertension -BP 130/94, close to goal  -continue amlodipine daily, no addition to current regimen -extensively discussed proper guidelines for checking BP at home, instructed to maintain a more accurate log and bring this to the next visit -follow up with PCP in 1 month      Mikesha Migliaccio , DO [facility]

## 2021-01-09 NOTE — Patient Instructions (Signed)
It was great seeing you today!  Your blood pressure was not too high, we will continue amlodipine. Please continue to record your blood pressures and bring them into your next visit. Make sure to be seated for 5-10 minutes prior to checking and keep your feet flat on the floor while resting your arm. We want to make sure you are resting and relaxed.  Regarding your vaginal discharge, I will let you know of any abnormal results.   Please follow up at your next scheduled appointment in 1 month, if anything arises between now and then, please don't hesitate to contact our office.   Thank you for allowing [place] to be a part of your medical care!  Thank you, Dr. Robyne Peers

## 2021-01-09 NOTE — Assessment & Plan Note (Signed)
-  per patient request, wet prep performed including GC/Chlamydia testing. Will notify patient of any abnormal results

## 2021-01-11 ENCOUNTER — Other Ambulatory Visit: Payer: Self-pay | Admitting: Family Medicine

## 2021-01-11 DIAGNOSIS — B9689 Other specified bacterial agents as the cause of diseases classified elsewhere: Secondary | ICD-10-CM

## 2021-01-11 LAB — CERVICOVAGINAL ANCILLARY ONLY
Bacterial Vaginitis (gardnerella): POSITIVE — AB
Candida Glabrata: NEGATIVE
Candida Vaginitis: NEGATIVE
Chlamydia: NEGATIVE
Comment: NEGATIVE
Comment: NEGATIVE
Comment: NEGATIVE
Comment: NEGATIVE
Comment: NEGATIVE
Comment: NORMAL
Neisseria Gonorrhea: NEGATIVE
Trichomonas: NEGATIVE

## 2021-01-11 MED ORDER — CLINDAMYCIN HCL 300 MG PO CAPS: 300.0000 mg | ORAL_CAPSULE | Freq: Two times a day (BID) | ORAL | 0 refills | Status: AC

## 2021-02-10 ENCOUNTER — Other Ambulatory Visit: Payer: Self-pay | Admitting: Family Medicine

## 2021-02-10 DIAGNOSIS — J45909 Unspecified asthma, uncomplicated: Secondary | ICD-10-CM

## 2021-02-10 DIAGNOSIS — O99519 Diseases of the respiratory system complicating pregnancy, unspecified trimester: Secondary | ICD-10-CM

## 2021-05-03 ENCOUNTER — Encounter: Payer: No Typology Code available for payment source | Admitting: Family Medicine

## 2021-05-06 ENCOUNTER — Ambulatory Visit (INDEPENDENT_AMBULATORY_CARE_PROVIDER_SITE_OTHER): Payer: 59 | Admitting: Family Medicine

## 2021-05-06 ENCOUNTER — Encounter: Payer: Self-pay | Admitting: Family Medicine

## 2021-05-06 VITALS — BP 128/84 | HR 68 | Ht 69.0 in | Wt 254.0 lb

## 2021-05-06 DIAGNOSIS — Z0000 Encounter for general adult medical examination without abnormal findings: Secondary | ICD-10-CM | POA: Diagnosis not present

## 2021-05-06 DIAGNOSIS — R109 Unspecified abdominal pain: Secondary | ICD-10-CM | POA: Insufficient documentation

## 2021-05-06 DIAGNOSIS — I10 Essential (primary) hypertension: Secondary | ICD-10-CM

## 2021-05-06 DIAGNOSIS — K5900 Constipation, unspecified: Secondary | ICD-10-CM

## 2021-05-06 DIAGNOSIS — R1030 Lower abdominal pain, unspecified: Secondary | ICD-10-CM

## 2021-05-06 DIAGNOSIS — R300 Dysuria: Secondary | ICD-10-CM | POA: Diagnosis not present

## 2021-05-06 HISTORY — DX: Unspecified abdominal pain: R10.9

## 2021-05-06 LAB — POCT URINALYSIS DIP (MANUAL ENTRY)
Bilirubin, UA: NEGATIVE
Blood, UA: NEGATIVE
Glucose, UA: NEGATIVE mg/dL
Ketones, POC UA: NEGATIVE mg/dL
Leukocytes, UA: NEGATIVE
Nitrite, UA: NEGATIVE
Protein Ur, POC: NEGATIVE mg/dL
Spec Grav, UA: 1.02 (ref 1.010–1.025)
Urobilinogen, UA: 0.2 E.U./dL
pH, UA: 7 (ref 5.0–8.0)

## 2021-05-06 MED ORDER — POLYETHYLENE GLYCOL 3350 17 GM/SCOOP PO POWD: 17.0000 g | Freq: Every day | ORAL | 0 refills | Status: DC

## 2021-05-06 NOTE — Assessment & Plan Note (Addendum)
-  lower abdominal pain from exam seems most consistent with constipation, encouraged to increase fiber in diet and discussed options for changing diet for improvement of symptoms and overall healthy lifestyle. Prescribed miralax and extensively discussed bristol stool chart to share appropriate BM pattern. Possibly cramping also mimicking menstruation. UA negative for UTI.  ?

## 2021-05-06 NOTE — Assessment & Plan Note (Signed)
-  BP 128/84, at goal ?-continue amlodipine ?-diet and exercise counseling provided  ?

## 2021-05-06 NOTE — Progress Notes (Signed)
? ? ?  SUBJECTIVE:  ? ?CHIEF COMPLAINT / HPI:  ? ?Patient presents for physical. Reports abdominal pain for the past 4-5 months, intermittently. Comes on for a week or a few days and then goes away for about 3 weeks and then starts again. LMP more than 4 years ago, has not had her menstrual cycle since having the nexplanon in place. Reports lower abdominal pain. Endorsing dysuria but denies hematuria, hematochezia or issues with urinating. Reports that bowel movements are normal but is on the keto diet and sometimes some foods make her constipated. Most recently has been having loose stools. Prior to 4-5 months, she had constipation but since she started drinking senna tea she goes more often now.  ? ? ?PERTINENT  PMH / PSH:  ? ?Hypertension ?Denies chest pain, dyspnea and leg swelling. Reports daily compliance on amlodipine.  ? ?OBJECTIVE:  ? ?BP 128/84   Pulse 68   Ht 5\' 9"  (1.753 m)   Wt 254 lb (115.2 kg)   SpO2 97%   BMI 37.51 kg/m?   ?General: Patient well-appearing, in no acute distress. ?HEENT: PERRLA, moist mucous membranes, normal buccal mucosa, non-tender thyroid without evidence of cervical LAD, non-bulging TM bilaterally without erythema or drainage  ?CV: RRR, no murmurs or gallops auscultated ?Resp: CTAB, no wheezing, rales or rhonchi noted ?GI: soft, tender along lower quadrants bilaterally with deep palpation, nondistended, presence of bowel sounds ?Neuro: normal gait ?Psych: mood appropriate  ? ?ASSESSMENT/PLAN:  ? ?Essential hypertension ?-BP 128/84, at goal ?-continue amlodipine ?-diet and exercise counseling provided  ? ?Abdominal pain ?-lower abdominal pain from exam seems most consistent with constipation, encouraged to increase fiber in diet and discussed options for changing diet for improvement of symptoms and overall healthy lifestyle. Prescribed miralax and extensively discussed bristol stool chart to share appropriate BM pattern. Possibly cramping also mimicking menstruation. UA  negative for UTI.  ?  ?-Blood work obtained per patient preference, discussed with patient that these labs are not indicated and are not my recommendation but would be due to her preference which she agreed.  ?-PAP due in 2024 ?-Med rec reviewed and updated appropriately  ? ?2025, DO ?[facility]  ?

## 2021-05-06 NOTE — Patient Instructions (Signed)
It was great seeing you today! ? ?Today we discussed many things. Your blood pressure looks great, keep taking amlodipine daily. ? ?It seems that your abdominal pain is due to irregular bowel movements. Please take miralax 1 capful and mix in water daily. The goal is to get 1-2 soft, formed stools daily. You may adjust the amount as appropriate. You do not seem to have a urinary tract infection.  ? ?I will let you know of any abnormal results regarding your blood work today.  ? ?Please follow up at your next scheduled appointment, if anything arises between now and then, please don't hesitate to contact our office. ? ? ?Thank you for allowing [place] to be a part of your medical care! ? ?Thank you, ?Dr. Robyne Peers  ? ? ?

## 2021-05-07 LAB — COMPREHENSIVE METABOLIC PANEL
ALT: 25 IU/L (ref 0–32)
AST: 27 IU/L (ref 0–40)
Albumin/Globulin Ratio: 2.1 (ref 1.2–2.2)
Albumin: 4.6 g/dL (ref 3.8–4.8)
Alkaline Phosphatase: 62 IU/L (ref 44–121)
BUN/Creatinine Ratio: 20 (ref 9–23)
BUN: 14 mg/dL (ref 6–20)
Bilirubin Total: 0.5 mg/dL (ref 0.0–1.2)
CO2: 22 mmol/L (ref 20–29)
Calcium: 9.7 mg/dL (ref 8.7–10.2)
Chloride: 105 mmol/L (ref 96–106)
Creatinine, Ser: 0.71 mg/dL (ref 0.57–1.00)
Globulin, Total: 2.2 g/dL (ref 1.5–4.5)
Glucose: 82 mg/dL (ref 70–99)
Potassium: 4.2 mmol/L (ref 3.5–5.2)
Sodium: 142 mmol/L (ref 134–144)
Total Protein: 6.8 g/dL (ref 6.0–8.5)
eGFR: 113 mL/min/{1.73_m2} (ref >59)

## 2021-05-07 LAB — LIPID PANEL
Chol/HDL Ratio: 3.1 ratio (ref 0.0–4.4)
Cholesterol, Total: 154 mg/dL (ref 100–199)
HDL: 50 mg/dL (ref >39)
LDL Chol Calc (NIH): 88 mg/dL (ref 0–99)
Triglycerides: 86 mg/dL (ref 0–149)
VLDL Cholesterol Cal: 16 mg/dL (ref 5–40)

## 2021-05-07 LAB — CBC
Hematocrit: 41.3 % (ref 34.0–46.6)
Hemoglobin: 13.2 g/dL (ref 11.1–15.9)
MCH: 26.9 pg (ref 26.6–33.0)
MCHC: 32 g/dL (ref 31.5–35.7)
MCV: 84 fL (ref 79–97)
Platelets: 217 10*3/uL (ref 150–450)
RBC: 4.9 x10E6/uL (ref 3.77–5.28)
RDW: 13.2 % (ref 11.7–15.4)
WBC: 5.3 10*3/uL (ref 3.4–10.8)

## 2021-05-07 LAB — TSH: TSH: 1.34 u[IU]/mL (ref 0.450–4.500)

## 2021-05-07 LAB — HEMOGLOBIN A1C
Est. average glucose Bld gHb Est-mCnc: 105 mg/dL
Hgb A1c MFr Bld: 5.3 % (ref 4.8–5.6)

## 2021-05-23 ENCOUNTER — Other Ambulatory Visit (HOSPITAL_COMMUNITY)
Admission: RE | Admit: 2021-05-23 | Discharge: 2021-05-23 | Disposition: A | Discharge status: Home / Self Care | Payer: 59 | Source: Ambulatory Visit | Attending: Family Medicine | Admitting: Family Medicine

## 2021-05-23 ENCOUNTER — Ambulatory Visit (INDEPENDENT_AMBULATORY_CARE_PROVIDER_SITE_OTHER): Payer: 59 | Admitting: Student

## 2021-05-23 ENCOUNTER — Encounter: Payer: Self-pay | Admitting: Student

## 2021-05-23 DIAGNOSIS — N898 Other specified noninflammatory disorders of vagina: Secondary | ICD-10-CM | POA: Insufficient documentation

## 2021-05-23 DIAGNOSIS — Z23 Encounter for immunization: Secondary | ICD-10-CM

## 2021-05-23 LAB — POCT WET PREP (WET MOUNT)
Clue Cells Wet Prep Whiff POC: NEGATIVE
Trichomonas Wet Prep HPF POC: ABSENT
WBC, Wet Prep HPF POC: NONE SEEN

## 2021-05-23 NOTE — Patient Instructions (Addendum)
It was great to see you! Thank you for allowing me to participate in your care! ? ?Today we saw you for vaginal discharge and performed a pelvic exam. I will contact you with the results of the test. ? ?Our plans for today:  ?- Testing for: Bacterial Vaginosis (BV), Trichomonas, Gonorrhea, Chlamydia, HIV, Syphilis ? ? ?We are checking some labs today, I will call you if they are abnormal will send you a MyChart message or a letter if they are normal.  If you do not hear about your labs in the next 2 weeks please let us know. ? ?Take care and seek immediate care sooner if you develop any concerns.  ? ?Dr. Bess Kinds, MD ?[facility] ? ?

## 2021-05-23 NOTE — Progress Notes (Signed)
?  SUBJECTIVE:  ? ?CHIEF COMPLAINT / HPI:  ? ?Vaginal discharge/ odor ?Vaginal order for less than week, and discharge for 2 week, thick and whitish. No vaginal or abdominal pain. No burning sensation, no pain with intercourse. No fevers, chills, nausea, or vomiting. No new medicines, but does rinse vagina with water after urination. No new partners, is in monogamous relationship with husband. ? ?Will test for BV, Trich, GC/Chlamydia  ? ?Patient also requesting titers for MMR, Varicella, Hep B ? ? ?PERTINENT  PMH / PSH: HTN, Depression  ? ? ?OBJECTIVE:  ?BP 124/75   Pulse 78   Ht $R'5\' 9"'cE$  (1.753 m)   Wt 253 lb 9.6 oz (115 kg)   SpO2 99%   BMI 37.45 kg/m?  ? ?General: NAD, pleasant, able to participate in exam ?Respiratory: normal effort, ?Physical Exam ?Genitourinary: ?   Labia:     ?   Right: No rash, tenderness or lesion.     ?   Left: No rash, tenderness or lesion.   ?   Comments: Vaginal vault and cervix visualized, no erythema, swelling, or lesions. White vaginal fluid present. Cervix normal in appearance, pink mucosa appreciated.  ? ? ?ASSESSMENT/PLAN:  ?No problem-specific Assessment & Plan notes found for this encounter. ?  ?Vaginal Discharge ?Patient complaining of vaginal discharge for 2 weeks with vaginal odor for 1 week.  Denies any symptoms of dyspareunia, abdominal pain, fever, constipation, or diarrhea.  Symptoms concerning for BV although could possibly be sexually transmitted infection, despite patient denying new partner.  Pelvic exam grossly normal with significant amount of vaginal discharge in vaginal vault. ?-Wet prep, GC, chlamydia, trichomoniasis, BV ? ?Vaccine titer  ?Patient requesting titers on MMR, varicella, hep B viruses, to assess need for vaccination.  ?-MMR titer, varicella titer, hep B surface antibody titer ? ?Orders Placed This Encounter  ?Procedures  ? Measles/Mumps/Rubella Immunity  ? Varicella Zoster Abs, IgG/IgM  ? Hepatitis B surface antibody,qualitative  ? POCT Wet Prep  [facility])  ? ?No orders of the defined types were placed in this encounter. ? ?No follow-ups on file. ?$RemoveB'@SIGNNOTE'cnQfOtmY$ @ ? ?

## 2021-05-27 ENCOUNTER — Telehealth: Payer: Self-pay | Admitting: Student

## 2021-05-27 LAB — MEASLES/MUMPS/RUBELLA IMMUNITY
MUMPS ABS, IGG: <9 AU/mL — ABNORMAL LOW (ref >10.9)
RUBEOLA AB, IGG: >300 AU/mL (ref >16.4)
Rubella Antibodies, IGG: 20 index (ref >0.99)

## 2021-05-27 LAB — VARICELLA ZOSTER ABS, IGG/IGM
Varicella IgM: <0.91 index (ref 0.00–0.90)
Varicella zoster IgG: 2192 index (ref >165)

## 2021-05-27 LAB — CERVICOVAGINAL ANCILLARY ONLY
Chlamydia: NEGATIVE
Comment: NEGATIVE
Comment: NEGATIVE
Comment: NORMAL
Neisseria Gonorrhea: NEGATIVE
Trichomonas: NEGATIVE

## 2021-05-27 MED ORDER — FLUCONAZOLE 150 MG PO TABS: 150.0000 mg | ORAL_TABLET | Freq: Once | ORAL | 0 refills | Status: AC

## 2021-05-27 NOTE — Telephone Encounter (Signed)
Called patient and left HIPAA compliant voicemail from [facility], requesting call back.  Patient with positive yeast infection on wet prep, will send in prescription of Diflucan 150 mg x1 for treatment. Patient to follow-up if symptoms persist. ?

## 2021-05-28 ENCOUNTER — Telehealth: Payer: Self-pay

## 2021-05-28 NOTE — Telephone Encounter (Signed)
Patient calls nurse line requesting results from recent titer labs.  ? ?Will forward to provider who saw patient.  ? ?Please advise. ?

## 2021-05-29 ENCOUNTER — Ambulatory Visit (INDEPENDENT_AMBULATORY_CARE_PROVIDER_SITE_OTHER): Payer: 59

## 2021-05-29 ENCOUNTER — Ambulatory Visit: Payer: 59 | Admitting: Family Medicine

## 2021-05-29 DIAGNOSIS — Z23 Encounter for immunization: Secondary | ICD-10-CM | POA: Diagnosis not present

## 2021-05-29 NOTE — Progress Notes (Signed)
Patient presents in nurse clinic for MMR vaccine.  ? ?Vaccine given without complication. ? ?[facility] updated and copy given to patient for her employer. ? ?Patient dropped off at the lab for Hepatitis B Titer draw. ? ? ?

## 2021-05-30 LAB — SPECIMEN STATUS REPORT

## 2021-05-30 LAB — HEPATITIS B SURFACE ANTIBODY,QUALITATIVE: Hep B Surface Ab, Qual: NONREACTIVE

## 2021-05-30 LAB — HEPATITIS B SURFACE ANTIBODY, QUANTITATIVE: Hepatitis B Surf Ab Quant: <3.1 m[IU]/mL — ABNORMAL LOW (ref >9.9)

## 2021-05-31 NOTE — Progress Notes (Signed)
Called patient! Thank you for letting me know!

## 2021-06-03 ENCOUNTER — Ambulatory Visit (INDEPENDENT_AMBULATORY_CARE_PROVIDER_SITE_OTHER): Payer: 59

## 2021-06-03 DIAGNOSIS — Z23 Encounter for immunization: Secondary | ICD-10-CM

## 2021-06-18 ENCOUNTER — Encounter: Payer: Self-pay | Admitting: *Deleted

## 2021-07-04 ENCOUNTER — Ambulatory Visit (INDEPENDENT_AMBULATORY_CARE_PROVIDER_SITE_OTHER): Payer: 59 | Admitting: Family Medicine

## 2021-07-04 VITALS — BP 110/70 | HR 73 | Ht 69.0 in | Wt 261.0 lb

## 2021-07-04 DIAGNOSIS — I10 Essential (primary) hypertension: Secondary | ICD-10-CM

## 2021-07-04 DIAGNOSIS — H9193 Unspecified hearing loss, bilateral: Secondary | ICD-10-CM | POA: Diagnosis not present

## 2021-07-04 NOTE — Progress Notes (Unsigned)
    SUBJECTIVE:   CHIEF COMPLAINT / HPI:   ***  PERTINENT  PMH / PSH: ***  OBJECTIVE:   BP 110/70   Pulse 73   Ht 5\' 9"  (1.753 m)   Wt 261 lb (118.4 kg)   SpO2 97%   BMI 38.54 kg/m   ***  ASSESSMENT/PLAN:   No problem-specific Assessment & Plan notes found for this encounter.     , DO [facility]

## 2021-07-04 NOTE — Patient Instructions (Signed)
-  Consider limiting sodium, caffeine, and alcohol for 2 weeks to see if symptoms improve -I am referring you to ENT and audiology  Dr. Salvadore Dom

## 2021-07-06 IMAGING — CR LEFT KNEE - COMPLETE 4+ VIEW
4 series · 4 of 4 positions shown · non-contrast
Comparison: None.

CLINICAL DATA: Left knee pain and swelling after starting [employer]
boxing class 1 week ago.

EXAM:
LEFT KNEE - COMPLETE 4+ VIEW

[knee ap]
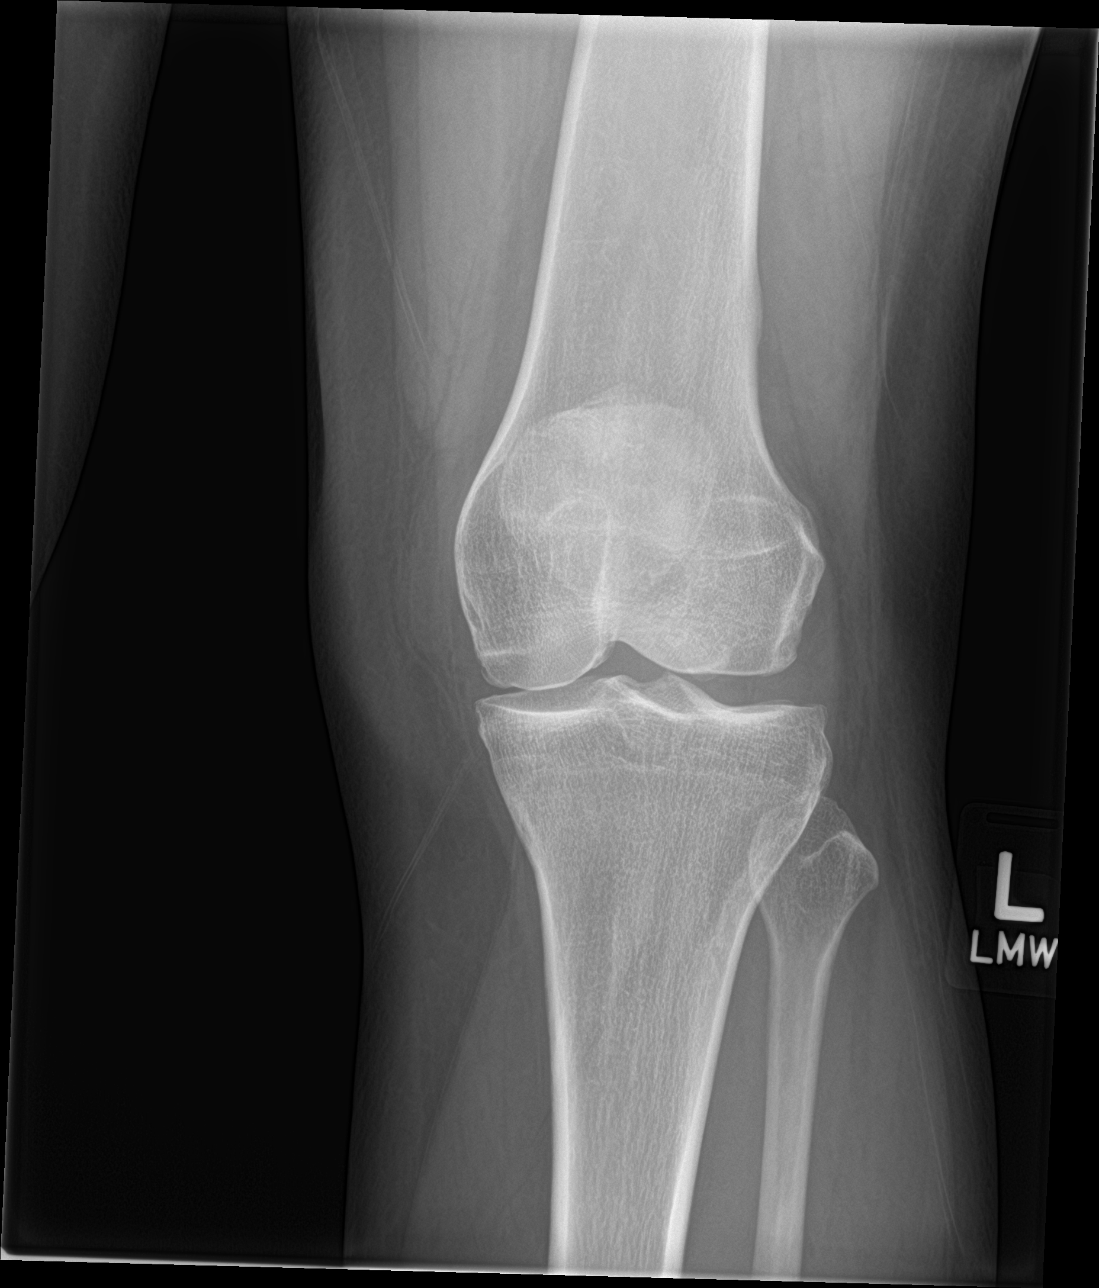

[knee obl (1 of 2)]
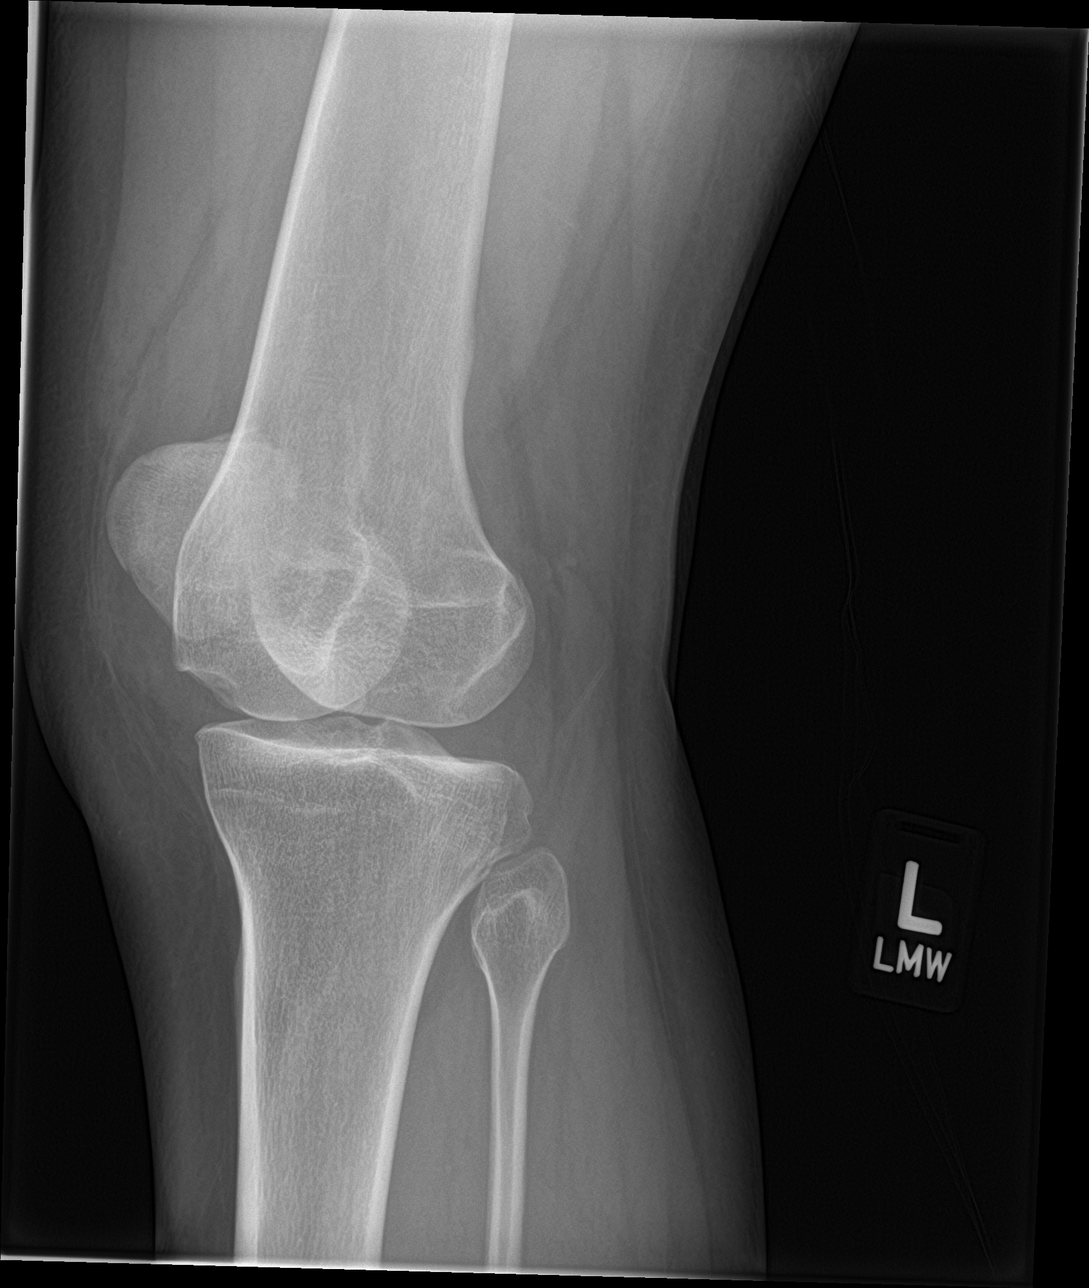

[knee obl (2 of 2)]
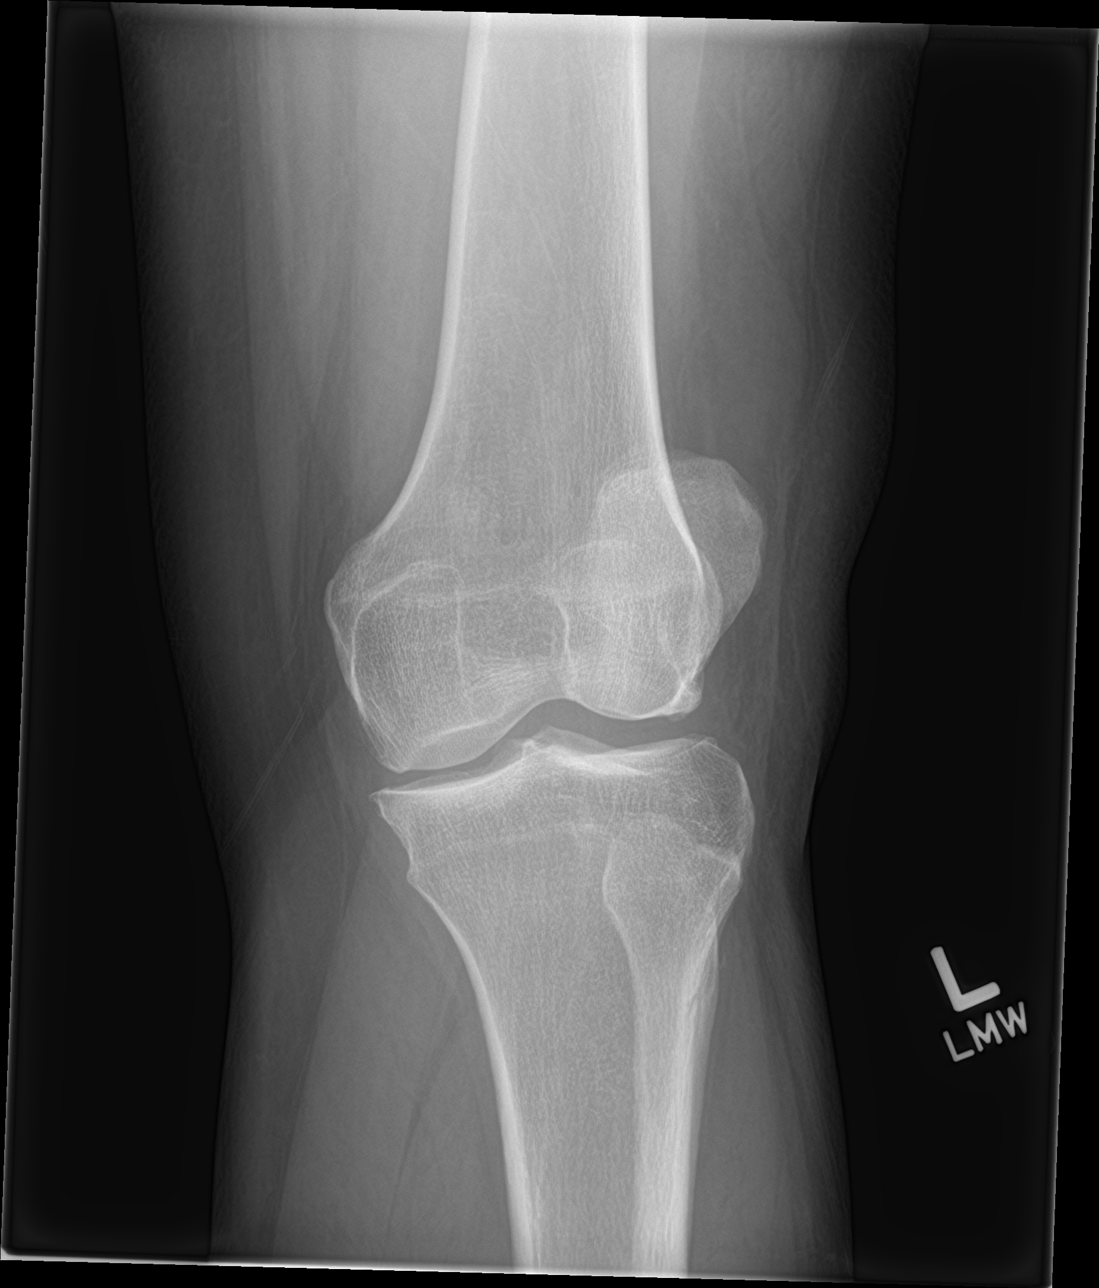

[knee lat]
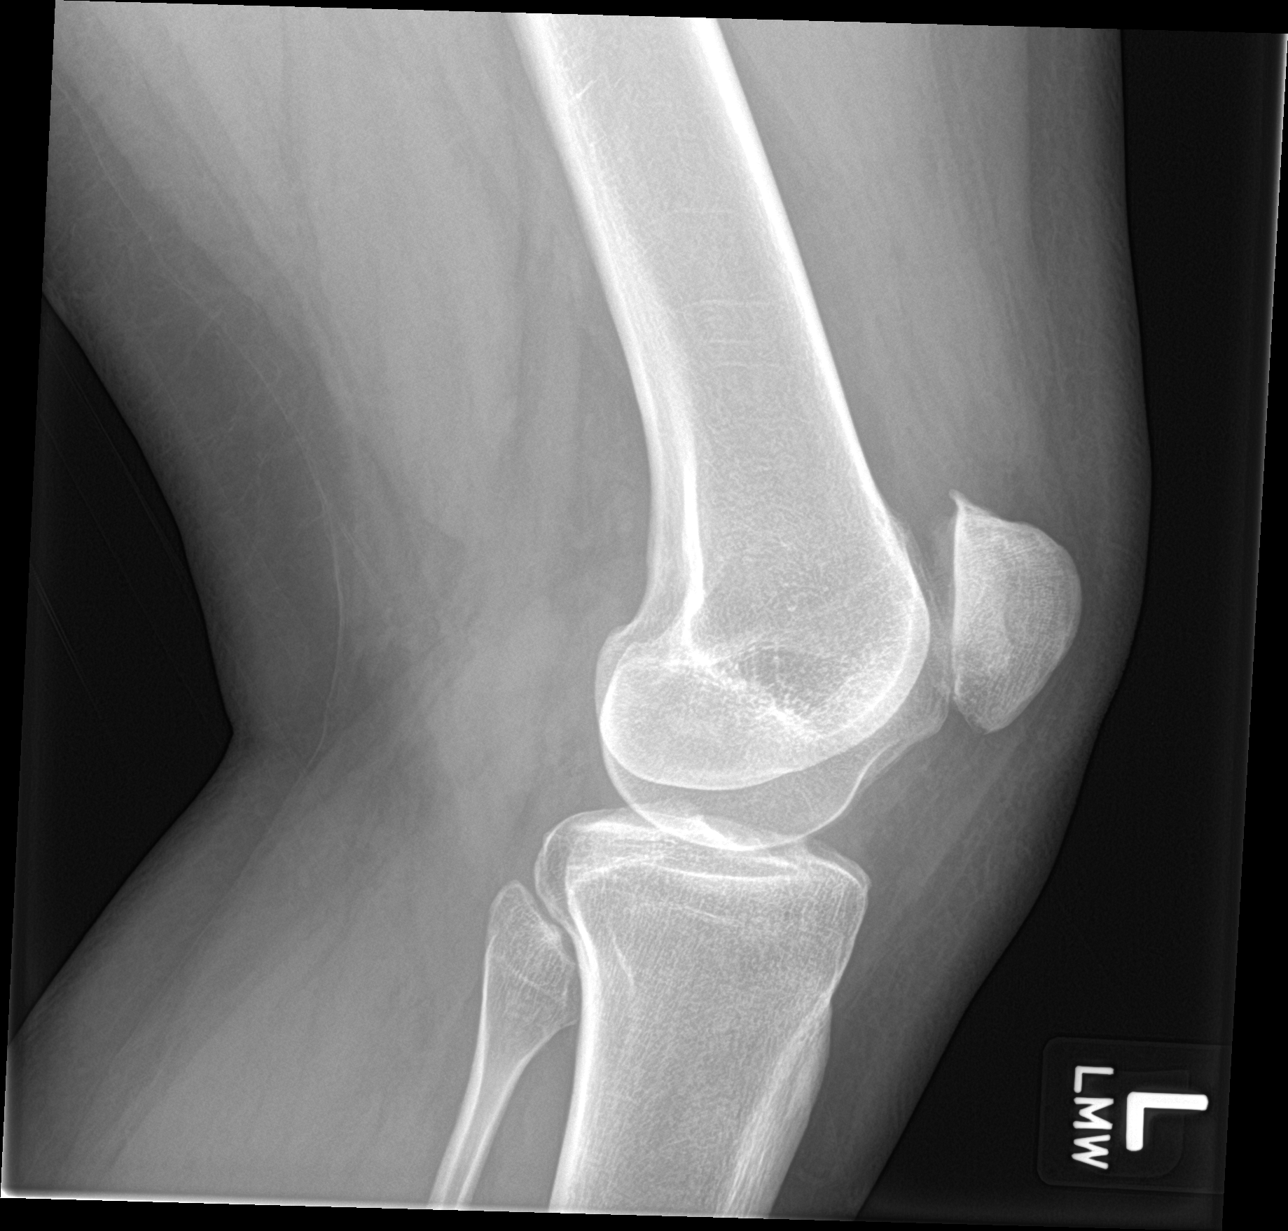

[4 of 4 positions shown; findings below may reference images not displayed]

FINDINGS: Mild tricompartmental spur formation. Mild medial joint space
narrowing. Corticated osteochondral defect in the medial femoral
condyle anteriorly. No fracture or effusion.
IMPRESSION: 1. No acute abnormality.
2. Mild tricompartmental degenerative changes.

## 2021-07-08 ENCOUNTER — Telehealth: Payer: Self-pay

## 2021-07-08 DIAGNOSIS — H9193 Unspecified hearing loss, bilateral: Secondary | ICD-10-CM | POA: Insufficient documentation

## 2021-07-11 ENCOUNTER — Ambulatory Visit: Payer: 59 | Attending: Audiology | Admitting: Audiology

## 2021-07-11 DIAGNOSIS — H9312 Tinnitus, left ear: Secondary | ICD-10-CM | POA: Diagnosis present

## 2021-07-11 DIAGNOSIS — H903 Sensorineural hearing loss, bilateral: Secondary | ICD-10-CM | POA: Diagnosis present

## 2021-07-12 NOTE — Procedures (Signed)
Outpatient Audiology and [facility] [address] [place], [place]  [address] [phone]  AUDIOLOGICAL  EVALUATION  NAME: Janet [name]     DOB:   [birth year]      MRN: [id number]                                                                                     DATE: 07/12/2021     REFERENT: [name], DO STATUS: Outpatient DIAGNOSIS: SNHL, bilateral    History: Janet [name] was seen for an audiological evaluation due to decreased hearing in the left ear occurring 1.5 months ago. She reports left, bothersome, tinnitus. Janet [name] describes her ear as feeling "uncomfortable". Janet [name] denies concerns regarding her right ear. Janet [name] denies otalgia, aural fullness, and dizziness. Janet [name] reports difficulty hearing in many listening situations and difficulty hearing her children.   Evaluation:  Otoscopy showed a clear view of the tympanic membranes, bilaterally Tympanometry results were consistent with normal middle ear pressure and hyper-complaint tympanic membrane mobility (Ad), bilaterally.  Audiometric testing was completed using Conventional Audiometry techniques with insert earphones, HF headphones and TDH headphones. Overall test reliability is considered fair to poor. Janet [name]'s initial thresholds in the left ear were in the profound hearing loss range and in the right ear in the moderate to moderately-severe hearing loss range. Janet [name] could converse easily during face to face communication. Janet [name] was given instructions multiple times and continued to have inconsistent responses. After 30 minutes of testing responses were obtained in the mild hearing loss range in the right ear and in the moderate to moderately-severe hearing loss range in the left ear. Janet [name] was inconsistent in her responses. Speech Recognition Thresholds (SRT) were obtained at 55 dB HL in the right ear and at 80  dB HL in the left ear. Pure Tone Average (PTA) and SRT were not in agreement suggesting in-consistencies  in Janet [name]'s hearing sensitivity. Bone Conduction Thresholds were obtained in the mild hearing loss range. Bone Conduction showed a mixed component in the left ear with poor reliability.  Word Recognition Testing was completed Janet [name] scored 92% in the right ear and at 100% in the left ear. Due to time constraints further testing was not attempted to obtain more reliable thresholds.   Distortion Product Otoacoustic Emissions (DPOAEs) were attempted but a hermetic seal could not be maintained.   Results:  Audiometric responses were obtained in the mild hearing loss range in the right ear and a moderate to moderately severe hearing loss range in the left ear. Test reliability is considered fair to poor. PTA and SRT were not in agreement suggesting test inconsistencies. Janet [name] may have hearing loss however further testing is recommended to further assess hearing sensitivity and to obtain more accurate results. The test results and recommendations were reviewed with Janet [name]. Follow up with ENT was reviewed and to obtain a repeat audiological evaluation at the ENT evaluation was recommended.   Recommendations: 1.   Continue with Referral to ENT for left hearing loss and tinnitus.  2.   Repeat audiological evaluation at ENT evaluation to confirm thresholds due to fair-poor reliability at today's hearing evaluation     40 minutes spent testing and counseling on  results.   If you have any questions please feel free to contact me at [phone].  Marton Redwood Audiologist, Au.D., CCC-A 07/12/2021  9:22 AM  Cc: [name], DO

## 2021-08-14 ENCOUNTER — Inpatient Hospital Stay
Admission: EM | Admit: 2021-08-14 | Discharge: 2021-08-17 | DRG: 371 | Disposition: A | Discharge status: Home / Self Care | Payer: 59 | Attending: Internal Medicine | Admitting: Internal Medicine

## 2021-08-14 ENCOUNTER — Emergency Department: Payer: 59

## 2021-08-14 ENCOUNTER — Other Ambulatory Visit: Payer: Self-pay

## 2021-08-14 DIAGNOSIS — J45909 Unspecified asthma, uncomplicated: Secondary | ICD-10-CM | POA: Diagnosis present

## 2021-08-14 DIAGNOSIS — R197 Diarrhea, unspecified: Secondary | ICD-10-CM | POA: Diagnosis not present

## 2021-08-14 DIAGNOSIS — Z833 Family history of diabetes mellitus: Secondary | ICD-10-CM

## 2021-08-14 DIAGNOSIS — R1110 Vomiting, unspecified: Secondary | ICD-10-CM

## 2021-08-14 DIAGNOSIS — Z803 Family history of malignant neoplasm of breast: Secondary | ICD-10-CM | POA: Diagnosis not present

## 2021-08-14 DIAGNOSIS — Z6838 Body mass index (BMI) 38.0-38.9, adult: Secondary | ICD-10-CM | POA: Diagnosis not present

## 2021-08-14 DIAGNOSIS — E669 Obesity, unspecified: Secondary | ICD-10-CM | POA: Diagnosis present

## 2021-08-14 DIAGNOSIS — E872 Acidosis, unspecified: Secondary | ICD-10-CM | POA: Diagnosis present

## 2021-08-14 DIAGNOSIS — Z8249 Family history of ischemic heart disease and other diseases of the circulatory system: Secondary | ICD-10-CM | POA: Diagnosis not present

## 2021-08-14 DIAGNOSIS — I10 Essential (primary) hypertension: Secondary | ICD-10-CM | POA: Diagnosis present

## 2021-08-14 DIAGNOSIS — R519 Headache, unspecified: Secondary | ICD-10-CM

## 2021-08-14 DIAGNOSIS — A039 Shigellosis, unspecified: Principal | ICD-10-CM | POA: Diagnosis present

## 2021-08-14 DIAGNOSIS — R6520 Severe sepsis without septic shock: Secondary | ICD-10-CM | POA: Diagnosis present

## 2021-08-14 DIAGNOSIS — N730 Acute parametritis and pelvic cellulitis: Secondary | ICD-10-CM | POA: Diagnosis present

## 2021-08-14 DIAGNOSIS — A419 Sepsis, unspecified organism: Secondary | ICD-10-CM | POA: Diagnosis present

## 2021-08-14 DIAGNOSIS — E876 Hypokalemia: Secondary | ICD-10-CM | POA: Diagnosis not present

## 2021-08-14 DIAGNOSIS — F32A Depression, unspecified: Secondary | ICD-10-CM | POA: Diagnosis present

## 2021-08-14 HISTORY — DX: Vomiting, unspecified: R11.10

## 2021-08-14 LAB — URINALYSIS, ROUTINE W REFLEX MICROSCOPIC
Bilirubin Urine: NEGATIVE
Glucose, UA: NEGATIVE mg/dL
Hgb urine dipstick: NEGATIVE
Ketones, ur: NEGATIVE mg/dL
Leukocytes,Ua: NEGATIVE
Nitrite: NEGATIVE
Protein, ur: NEGATIVE mg/dL
Specific Gravity, Urine: 1.013 (ref 1.005–1.030)
pH: 5 (ref 5.0–8.0)

## 2021-08-14 LAB — CHLAMYDIA/NGC RT PCR (ARMC ONLY)
Chlamydia Tr: NOT DETECTED
N gonorrhoeae: NOT DETECTED

## 2021-08-14 LAB — COMPREHENSIVE METABOLIC PANEL
ALT: 23 U/L (ref 0–44)
AST: 29 U/L (ref 15–41)
Albumin: 4.1 g/dL (ref 3.5–5.0)
Alkaline Phosphatase: 45 U/L (ref 38–126)
Anion gap: 8 (ref 5–15)
BUN: 15 mg/dL (ref 6–20)
CO2: 20 mmol/L — ABNORMAL LOW (ref 22–32)
Calcium: 9.2 mg/dL (ref 8.9–10.3)
Chloride: 109 mmol/L (ref 98–111)
Creatinine, Ser: 0.78 mg/dL (ref 0.44–1.00)
GFR, Estimated: >60 mL/min (ref >60)
Glucose, Bld: 99 mg/dL (ref 70–99)
Potassium: 4.1 mmol/L (ref 3.5–5.1)
Sodium: 137 mmol/L (ref 135–145)
Total Bilirubin: 1.1 mg/dL (ref 0.3–1.2)
Total Protein: 7.1 g/dL (ref 6.5–8.1)

## 2021-08-14 LAB — CBC
HCT: 41.6 % (ref 36.0–46.0)
Hemoglobin: 13.9 g/dL (ref 12.0–15.0)
MCH: 27.9 pg (ref 26.0–34.0)
MCHC: 33.4 g/dL (ref 30.0–36.0)
MCV: 83.4 fL (ref 80.0–100.0)
Platelets: 178 10*3/uL (ref 150–400)
RBC: 4.99 MIL/uL (ref 3.87–5.11)
RDW: 12.9 % (ref 11.5–15.5)
WBC: 12 10*3/uL — ABNORMAL HIGH (ref 4.0–10.5)
nRBC: 0 % (ref 0.0–0.2)

## 2021-08-14 LAB — LACTIC ACID, PLASMA
Lactic Acid, Venous: 3.8 mmol/L (ref 0.5–1.9)
Lactic Acid, Venous: 3 mmol/L (ref 0.5–1.9)

## 2021-08-14 LAB — WET PREP, GENITAL
Clue Cells Wet Prep HPF POC: NONE SEEN
Sperm: NONE SEEN
Trich, Wet Prep: NONE SEEN
WBC, Wet Prep HPF POC: <10 (ref <10)
Yeast Wet Prep HPF POC: NONE SEEN

## 2021-08-14 LAB — LIPASE, BLOOD: Lipase: 23 U/L (ref 11–51)

## 2021-08-14 LAB — PREGNANCY, URINE: Preg Test, Ur: NEGATIVE

## 2021-08-14 MED ORDER — ENOXAPARIN SODIUM 60 MG/0.6ML IJ SOSY
0.5000 mg/kg | PREFILLED_SYRINGE | INTRAMUSCULAR | Status: DC
  Administered 2021-08-14 – 2021-08-16 (×3): 60 mg via SUBCUTANEOUS
  Filled 2021-08-14 (×3): qty 0.6

## 2021-08-14 MED ORDER — SODIUM CHLORIDE 0.9 % IV BOLUS
1000.0000 mL | Freq: Once | INTRAVENOUS | Status: AC
  Administered 2021-08-14: 1000 mL via INTRAVENOUS

## 2021-08-14 MED ORDER — METOCLOPRAMIDE HCL 5 MG/ML IJ SOLN
10.0000 mg | Freq: Once | INTRAMUSCULAR | Status: AC
  Administered 2021-08-14: 10 mg via INTRAVENOUS

## 2021-08-14 MED ORDER — METOCLOPRAMIDE HCL 5 MG/ML IJ SOLN
10.0000 mg | Freq: Once | INTRAMUSCULAR | Status: DC
  Filled 2021-08-14: qty 2

## 2021-08-14 MED ORDER — SODIUM CHLORIDE 0.9 % IV SOLN
2.0000 g | Freq: Two times a day (BID) | INTRAVENOUS | Status: DC
  Administered 2021-08-14 – 2021-08-17 (×6): 2 g via INTRAVENOUS
  Filled 2021-08-14 (×7): qty 2

## 2021-08-14 MED ORDER — SODIUM CHLORIDE 0.9 % IV SOLN: 1.0000 g | Freq: Three times a day (TID) | INTRAVENOUS | Status: DC

## 2021-08-14 MED ORDER — ACETAMINOPHEN 325 MG PO TABS
650.0000 mg | ORAL_TABLET | Freq: Four times a day (QID) | ORAL | Status: DC | PRN
  Administered 2021-08-14 – 2021-08-16 (×2): 650 mg via ORAL
  Filled 2021-08-14 (×2): qty 2

## 2021-08-14 MED ORDER — ACETAMINOPHEN 500 MG PO TABS
1000.0000 mg | ORAL_TABLET | Freq: Once | ORAL | Status: AC
  Administered 2021-08-14: 1000 mg via ORAL
  Filled 2021-08-14: qty 2

## 2021-08-14 MED ORDER — ONDANSETRON HCL 4 MG PO TABS: 4.0000 mg | ORAL_TABLET | Freq: Four times a day (QID) | ORAL | Status: DC | PRN

## 2021-08-14 MED ORDER — DIPHENHYDRAMINE HCL 50 MG/ML IJ SOLN
25.0000 mg | Freq: Once | INTRAMUSCULAR | Status: AC
  Administered 2021-08-14: 25 mg via INTRAVENOUS
  Filled 2021-08-14: qty 1

## 2021-08-14 MED ORDER — MORPHINE SULFATE (PF) 4 MG/ML IV SOLN
4.0000 mg | Freq: Once | INTRAVENOUS | Status: AC
  Administered 2021-08-14: 4 mg via INTRAVENOUS
  Filled 2021-08-14: qty 1

## 2021-08-14 MED ORDER — IOHEXOL 300 MG/ML  SOLN
100.0000 mL | Freq: Once | INTRAMUSCULAR | Status: AC | PRN
  Administered 2021-08-14: 100 mL via INTRAVENOUS

## 2021-08-14 MED ORDER — SODIUM CHLORIDE 0.9 % IV SOLN: 25.0000 mg | Freq: Once | INTRAVENOUS | Status: DC

## 2021-08-14 MED ORDER — ACETAMINOPHEN 650 MG RE SUPP: 650.0000 mg | Freq: Four times a day (QID) | RECTAL | Status: DC | PRN

## 2021-08-14 MED ORDER — SODIUM CHLORIDE 0.9 % IV SOLN
100.0000 mg | Freq: Two times a day (BID) | INTRAVENOUS | Status: DC
  Administered 2021-08-15 – 2021-08-17 (×5): 100 mg via INTRAVENOUS
  Filled 2021-08-14 (×6): qty 100

## 2021-08-14 MED ORDER — HYDROCODONE-ACETAMINOPHEN 5-325 MG PO TABS
1.0000 | ORAL_TABLET | ORAL | Status: DC | PRN
  Administered 2021-08-14: 2 via ORAL
  Administered 2021-08-15 (×2): 1 via ORAL
  Administered 2021-08-16: 2 via ORAL
  Filled 2021-08-14 (×2): qty 2
  Filled 2021-08-14: qty 1
  Filled 2021-08-14: qty 2

## 2021-08-14 MED ORDER — PIPERACILLIN-TAZOBACTAM 3.375 G IVPB: 3.3750 g | Freq: Three times a day (TID) | INTRAVENOUS | Status: DC

## 2021-08-14 MED ORDER — ONDANSETRON HCL 4 MG/2ML IJ SOLN
4.0000 mg | Freq: Four times a day (QID) | INTRAMUSCULAR | Status: DC | PRN
  Filled 2021-08-14: qty 2

## 2021-08-14 MED ORDER — MORPHINE SULFATE (PF) 4 MG/ML IV SOLN: 4.0000 mg | Freq: Once | INTRAVENOUS | Status: DC

## 2021-08-14 MED ORDER — LACTATED RINGERS IV SOLN: INTRAVENOUS | Status: AC

## 2021-08-14 MED ORDER — MORPHINE SULFATE (PF) 2 MG/ML IV SOLN
2.0000 mg | INTRAVENOUS | Status: DC | PRN
  Administered 2021-08-15: 2 mg via INTRAVENOUS
  Filled 2021-08-14: qty 1

## 2021-08-14 MED ORDER — SODIUM CHLORIDE 0.9 % IV SOLN
100.0000 mg | Freq: Once | INTRAVENOUS | Status: AC
  Administered 2021-08-14: 100 mg via INTRAVENOUS
  Filled 2021-08-14: qty 100

## 2021-08-14 NOTE — Consult Note (Signed)
CODE SEPSIS - PHARMACY COMMUNICATION  **Broad Spectrum Antibiotics should be administered within 1 hour of Sepsis diagnosis**  Time Code Sepsis Called/Page Received: 1916  Antibiotics Ordered: Doxy, Cefotetan  Time of 1st antibiotic administration: 1833  Additional action taken by pharmacy: none  If necessary, Name of Provider/Nurse Contacted: n/a    Bettey Costa ,PharmD Clinical Pharmacist  08/14/2021  7:10 PM

## 2021-08-14 NOTE — Assessment & Plan Note (Signed)
Could be related to PID, could be acute gastroenteritis GI panel and stool for C. difficile Continue IV hydration as above

## 2021-08-14 NOTE — ED Triage Notes (Signed)
Pt c/o R side abdominal pain and n/v/d starting today.  Pain score 10/10.

## 2021-08-14 NOTE — ED Provider Triage Note (Signed)
Emergency Medicine Provider Triage Evaluation Note  Janet [name] , a 36 y.o. female  was evaluated in triage.  Pt complains of N,V,D today. Fever  and chills.  C/0 abdominal RLQ pain.   No sick contacts.    Review of Systems  Positive: + fever, v x 4 and d x 3 Negative:   Physical Exam  BP (!) 145/80 (BP Location: Right Arm)   Pulse (!) 107   Temp (!) 100.4 F (38 C) (Oral)   Resp (!) 24   SpO2 100%  Gen:   Awake, no distress   Resp:  Normal effort  MSK:   Moves extremities without difficulty  Other:  ABd soft, tender RLQ and suprapubic tenderness.    Medical Decision Making  Medically screening exam initiated at 1:46 PM.  Appropriate orders placed.  Albena Comes was informed that the remainder of the evaluation will be completed by another provider, this initial triage assessment does not replace that evaluation, and the importance of remaining in the ED until their evaluation is complete.     Tommi Rumps, PA-C 08/14/21 1349

## 2021-08-14 NOTE — Assessment & Plan Note (Signed)
Tylenol as needed.

## 2021-08-14 NOTE — H&P (Signed)
History and Physical    Patient: Janet [name] ZOX:[id number] DOB: 19-Apr-1985 DOA: 08/14/2021 DOS: the patient was seen and examined on 08/14/2021 PCP: [name], DO  Patient coming from: Home  Chief Complaint:  Chief Complaint  Patient presents with   Abdominal Pain   Emesis   Diarrhea    HPI: Janet [name] is a 36 y.o. female with medical history significant for Hypertension, obesity, headaches who presents with a 1 day history of nausea, vomiting and diarrhea and lower abdominal pain.  Diarrhea is nonblack and nonbloody.  She also has a vaginal discharge.  She denies pain or burning with urination.  Denies fever or chills.  Denies affected contacts.  She also complains of a headache. ED course and data review: Tmax 102.7, pulse 117, BP low at 102/54, O2 sat 96% on room air Labs WBC 12,000 with lactic acid 3.8.  Lipase and LFTs WNL.  CBC and CMP otherwise unremarkable.  Urinalysis sterile.  Wet prep unremarkable.  GC and chlamydia pending. Patient had cervical motion tenderness on examination in the ED  CT abdomen and pelvis was concerning for hydrosalpinx which was ruled out on follow-up ultrasound as follows Pelvic ultrasound:IMPRESSION: 1. Normal appearance of the uterus and ovaries. 2. Possible fluid collection posterior to the uterus, which is difficult to evaluate transabdominally but may be physiologic. CT abdomen and pelvis IMPRESSION: 1. Tubular and cystic collections in the right adnexal region likely representing hydrosalpinx. Suggest pelvic ultrasound for further evaluation. 2. No evidence of bowel obstruction or inflammation. Appendix is normal.  Patient started on sepsis fluids, pancultured.  Due to Flagyl allergy following pharmacy consult, started on cefotetan and doxycycline.  Hospitalist consulted for admission.     Past Medical History:  Diagnosis Date   Anemia    Asthma    Depression    Gestational diabetes mellitus, antepartum 08/12/2016   Moved pt to  BRX GDM program Growth [place] at 38 week EFW 7#13oz, 82nd%   Headache    migraines   History of cesarean delivery 04/24/2016   Twins, breech, 42m and pt not told she needs rpt. Pt told she's eligible for TOLAC; she desires RCS.    Past Surgical History:  Procedure Laterality Date   CESAREAN SECTION     CESAREAN SECTION N/A 10/17/2016   Procedure: REPEAT CESAREAN SECTION;  Surgeon: Adam Phenix, MD;  Location: [facility] BIRTHING SUIT[facility];  Service: Obstetrics;  Laterality: N/A;   DILATION AND CURETTAGE OF UTERUS     Social History:  reports that she has never smoked. She has never used smokeless tobacco. She reports current alcohol use. She reports that she does not use drugs.  Allergies  Allergen Reactions   Flagyl [Metronidazole] Itching   Ibuprofen Swelling    Facial swelling   Nsaids Other (See Comments)    Stomach ulcers   Ondansetron Hcl Other (See Comments)    Pt states "it made me crazy" Pt states "it made me crazy" Pt states "it made me crazy"   Other     Other reaction(s): Other (See Comments) Stomach ulcers   Levofloxacin In D5w Rash and Itching    Family History  Problem Relation Age of Onset   Hypertension Mother    Heart disease Mother    Diabetes Father    Breast cancer Maternal Aunt 69    Prior to Admission medications   Medication Sig Start Date End Date Taking? Authorizing Provider  amLODipine (NORVASC) 10 MG tablet Take 1 tablet (10 mg total) by mouth  at bedtime. 09/05/20   Autry-Lott, Randa Evens, DO  Etonogestrel (NEXPLANON Friedens) Inject into the skin.    [provider]  polyethylene glycol powder (GLYCOLAX/MIRALAX) 17 GM/SCOOP powder Take 17 g by mouth daily. 05/06/21   [name], DO    Physical Exam: Vitals:   08/14/21 1556 08/14/21 1653 08/14/21 1909 08/14/21 2026  BP: (!) 140/76  (!) 102/54 111/63  Pulse: (!) 106  (!) 117 (!) 113  Resp: 20  18 16   Temp:  (!) 103.1 F (39.5 C) (!) 102.7 F (39.3 C) 99.3 F (37.4 C)  TempSrc:  Oral Oral Oral   SpO2: 100%  96% 100%  Weight:      Height:       Physical Exam Vitals and nursing note reviewed.  Constitutional:      General: She is not in acute distress. HENT:     Head: Normocephalic and atraumatic.  Cardiovascular:     Rate and Rhythm: Normal rate and regular rhythm.     Heart sounds: Normal heart sounds.  Pulmonary:     Effort: Pulmonary effort is normal.     Breath sounds: Normal breath sounds.  Abdominal:     Palpations: Abdomen is soft.     Tenderness: There is no abdominal tenderness.  Neurological:     Mental Status: Mental status is at baseline.     Labs on Admission: I have personally reviewed following labs and imaging studies  CBC: Recent Labs  Lab 08/14/21 1402  WBC 12.0*  HGB 13.9  HCT 41.6  MCV 83.4  PLT 178   Basic Metabolic Panel: Recent Labs  Lab 08/14/21 1402  NA 137  K 4.1  CL 109  CO2 20*  GLUCOSE 99  BUN 15  CREATININE 0.78  CALCIUM 9.2   GFR: Estimated Creatinine Clearance: 132.4 mL/min (by C-G formula based on SCr of 0.78 mg/dL). Liver Function Tests: Recent Labs  Lab 08/14/21 1402  AST 29  ALT 23  ALKPHOS 45  BILITOT 1.1  PROT 7.1  ALBUMIN 4.1   Recent Labs  Lab 08/14/21 1402  LIPASE 23   No results for input(s): "AMMONIA" in the last 168 hours. Coagulation Profile: No results for input(s): "INR", "PROTIME" in the last 168 hours. Cardiac Enzymes: No results for input(s): "CKTOTAL", "CKMB", "CKMBINDEX", "TROPONINI" in the last 168 hours. BNP (last 3 results) No results for input(s): "PROBNP" in the last 8760 hours. HbA1C: No results for input(s): "HGBA1C" in the last 72 hours. CBG: No results for input(s): "GLUCAP" in the last 168 hours. Lipid Profile: No results for input(s): "CHOL", "HDL", "LDLCALC", "TRIG", "CHOLHDL", "LDLDIRECT" in the last 72 hours. Thyroid Function Tests: No results for input(s): "TSH", "T4TOTAL", "FREET4", "T3FREE", "THYROIDAB" in the last 72 hours. Anemia Panel: No results for  input(s): "VITAMINB12", "FOLATE", "FERRITIN", "TIBC", "IRON", "RETICCTPCT" in the last 72 hours. Urine analysis:    Component Value Date/Time   COLORURINE YELLOW (A) 08/14/2021 1351   APPEARANCEUR HAZY (A) 08/14/2021 1351   LABSPEC 1.013 08/14/2021 1351   PHURINE 5.0 08/14/2021 1351   GLUCOSEU NEGATIVE 08/14/2021 1351   HGBUR NEGATIVE 08/14/2021 1351   BILIRUBINUR NEGATIVE 08/14/2021 1351   BILIRUBINUR negative 05/06/2021 1633   KETONESUR NEGATIVE 08/14/2021 1351   PROTEINUR NEGATIVE 08/14/2021 1351   UROBILINOGEN 0.2 05/06/2021 1633   NITRITE NEGATIVE 08/14/2021 1351   LEUKOCYTESUR NEGATIVE 08/14/2021 1351    Radiological Exams on Admission: 10/14/2021 PELVIS (TRANSABDOMINAL ONLY)  Result Date: 08/14/2021 CLINICAL DATA:  Pelvic pain EXAM: TRANSABDOMINAL ULTRASOUND OF  PELVIS DOPPLER ULTRASOUND OF OVARIES TECHNIQUE: Transabdominal ultrasound examination of the pelvis was performed including evaluation of the uterus, ovaries, adnexal regions, and pelvic cul-de-sac. Color and duplex Doppler ultrasound was utilized to evaluate blood flow to the ovaries. COMPARISON:  08/14/2021 CT abdomen pelvis FINDINGS: Uterus Measurements: 11.8 x 4.2 x 5.2 cm = volume: 136 mL. No fibroids or other mass visualized. Endometrium Thickness: 6 mm, within normal limits. No focal abnormality visualized. Right ovary Measurements: 2.9 x 2.1 x 2.1 cm = volume: 7 mL. Normal appearance/no adnexal mass. Left ovary Measurements: 4.6 x 2.4 x 3.3 cm = volume: 19 mL. Normal appearance/no adnexal mass. Pulsed Doppler evaluation demonstrates normal low-resistance arterial and venous waveforms in both ovaries. Other: Possible fluid collection posterior to the uterus, which is difficult to evaluate transabdominally. IMPRESSION: 1. Normal appearance of the uterus and ovaries. 2. Possible fluid collection posterior to the uterus, which is difficult to evaluate transabdominally but may be physiologic. Electronically Signed   By: Wiliam Ke  M.D.   On: 08/14/2021 19:26   US PELVIC DOPPLER (TORSION R/O OR MASS ARTERIAL FLOW)  Result Date: 08/14/2021 CLINICAL DATA:  Pelvic pain EXAM: TRANSABDOMINAL ULTRASOUND OF PELVIS DOPPLER ULTRASOUND OF OVARIES TECHNIQUE: Transabdominal ultrasound examination of the pelvis was performed including evaluation of the uterus, ovaries, adnexal regions, and pelvic cul-de-sac. Color and duplex Doppler ultrasound was utilized to evaluate blood flow to the ovaries. COMPARISON:  08/14/2021 CT abdomen pelvis FINDINGS: Uterus Measurements: 11.8 x 4.2 x 5.2 cm = volume: 136 mL. No fibroids or other mass visualized. Endometrium Thickness: 6 mm, within normal limits. No focal abnormality visualized. Right ovary Measurements: 2.9 x 2.1 x 2.1 cm = volume: 7 mL. Normal appearance/no adnexal mass. Left ovary Measurements: 4.6 x 2.4 x 3.3 cm = volume: 19 mL. Normal appearance/no adnexal mass. Pulsed Doppler evaluation demonstrates normal low-resistance arterial and venous waveforms in both ovaries. Other: Possible fluid collection posterior to the uterus, which is difficult to evaluate transabdominally. IMPRESSION: 1. Normal appearance of the uterus and ovaries. 2. Possible fluid collection posterior to the uterus, which is difficult to evaluate transabdominally but may be physiologic. Electronically Signed   By: Wiliam Ke M.D.   On: 08/14/2021 19:26   CT ABDOMEN PELVIS W CONTRAST  Result Date: 08/14/2021 CLINICAL DATA:  Acute nonlocalized abdominal pain. Right-sided abdominal pain with nausea, vomiting, and diarrhea today. EXAM: CT ABDOMEN AND PELVIS WITH CONTRAST TECHNIQUE: Multidetector CT imaging of the abdomen and pelvis was performed using the standard protocol following bolus administration of intravenous contrast. RADIATION DOSE REDUCTION: This exam was performed according to the departmental dose-optimization program which includes automated exposure control, adjustment of the mA and/or kV according to patient size  and/or use of iterative reconstruction technique. CONTRAST:  OMNIPAQUE IOHEXOL 300 MG/ML  SOLN COMPARISON:  None Available. FINDINGS: Lower chest: Motion artifact in the lung bases. No focal consolidation. Hepatobiliary: No focal liver abnormality is seen. No gallstones, gallbladder wall thickening, or biliary dilatation. Pancreas: Unremarkable. No pancreatic ductal dilatation or surrounding inflammatory changes. Spleen: Normal in size without focal abnormality. Adrenals/Urinary Tract: Adrenal glands are unremarkable. Kidneys are normal, without renal calculi, focal lesion, or hydronephrosis. Bladder is unremarkable. Stomach/Bowel: Stomach is within normal limits. Appendix appears normal. No evidence of bowel wall thickening, distention, or inflammatory changes. Vascular/Lymphatic: No significant vascular findings are present. No enlarged abdominal or pelvic lymph nodes. Reproductive: Tubular and cystic collections in the right adnexal region likely representing hydrosalpinx. Consider pelvic ultrasound for further evaluation. Uterus and left  ovary are not enlarged. Other: No free air or free fluid in the abdomen. Broad-based ventral periumbilical hernia containing small bowel without proximal obstruction. Musculoskeletal: No acute or significant osseous findings. IMPRESSION: 1. Tubular and cystic collections in the right adnexal region likely representing hydrosalpinx. Suggest pelvic ultrasound for further evaluation. 2. No evidence of bowel obstruction or inflammation. Appendix is normal. Electronically Signed   By: Burman Nieves M.D.   On: 08/14/2021 17:10     Data Reviewed: Relevant notes from primary care and specialist visits, past discharge summaries as available in EHR, including Care Everywhere. Prior diagnostic testing as pertinent to current admission diagnoses Updated medications and problem lists for reconciliation ED course, including vitals, labs, imaging, treatment and response to  treatment Triage notes, nursing and pharmacy notes and ED provider's notes Notable results as noted in HPI   Assessment and Plan: * Sepsis (HCC) Sepsis criteria includes fever, tachycardia, leukocytosis and lactic acidosis and infectious source Infectious source appears to be PID but patient has diarrhea which might make acute gastroenteritis a possibility Continue sepsis fluids Follow cultures Treat potential sources as outlined below  PID (acute pelvic inflammatory disease) Vaginal discharge with cervical motion tenderness.  Wet prep normal, GC and chlamydia pending Continue cefotetan and doxycycline.  Patient has Flagyl allergy Consider GYN consult  Vomiting and diarrhea Could be related to PID, could be acute gastroenteritis GI panel and stool for C. difficile Continue IV hydration as above  Headache Tylenol as needed  Essential hypertension Will hold home antihypertensives tonight as patient had a borderline low BP in the ED of 102/54.  To resume as appropriate    DVT prophylaxis: Lovenox  Consults: none  Advance Care Planning:   Code Status: Prior   Family Communication: none  Disposition Plan: Back to previous home environment  Severity of Illness: The appropriate patient status for this patient is INPATIENT. Inpatient status is judged to be reasonable and necessary in order to provide the required intensity of service to ensure the patient's safety. The patient's presenting symptoms, physical exam findings, and initial radiographic and laboratory data in the context of their chronic comorbidities is felt to place them at high risk for further clinical deterioration. Furthermore, it is not anticipated that the patient will be medically stable for discharge from the hospital within 2 midnights of admission.   * I certify that at the point of admission it is my clinical judgment that the patient will require inpatient hospital care spanning beyond 2 midnights from the  point of admission due to high intensity of service, high risk for further deterioration and high frequency of surveillance required.*  Author: Andris Baumann, MD 08/14/2021 9:10 PM  For on call review [email or web address].

## 2021-08-14 NOTE — Assessment & Plan Note (Signed)
Vaginal discharge with cervical motion tenderness.  Wet prep normal, GC and chlamydia pending Continue cefotetan and doxycycline.  Patient has Flagyl allergy Consider GYN consult

## 2021-08-14 NOTE — Assessment & Plan Note (Signed)
Sepsis criteria includes fever, tachycardia, leukocytosis and lactic acidosis and infectious source Infectious source appears to be PID but patient has diarrhea which might make acute gastroenteritis a possibility Continue sepsis fluids Follow cultures Treat potential sources as outlined below

## 2021-08-14 NOTE — ED Provider Notes (Signed)
[facility] Provider Note    Event Date/Time   First MD Initiated Contact with Patient 08/14/21 1614     (approximate)   History   Abdominal Pain, Emesis, and Diarrhea   HPI  Janet [name] is a 36 y.o. female here with abdominal pain, nausea, vomiting, and diarrhea.  The patient states that she woke up this morning with severe, right lower quadrant abdominal pain.  She had associated nausea, vomiting, as well as watery diarrhea.  She has had essentially persistent diarrhea since then.  She has felt general fatigue.  She has had chills.  She has had fever, chills.  No history of similar symptoms in the past.  She has never had surgery on her abdomen.  No known sick contacts.  No suspicious food intake.     Physical Exam   Triage Vital Signs: ED Triage Vitals  Enc Vitals Group     BP 08/14/21 1345 (!) 145/80     Pulse Rate 08/14/21 1345 (!) 107     Resp 08/14/21 1345 (!) 24     Temp 08/14/21 1345 (!) 100.4 F (38 C)     Temp Source 08/14/21 1345 Oral     SpO2 08/14/21 1345 100 %     Weight 08/14/21 1347 261 lb (118.4 kg)     Height 08/14/21 1347 5\' 9"  (1.753 m)     Head Circumference --      Peak Flow --      Pain Score 08/14/21 1347 10     Pain Loc --      Pain Edu? --      Excl. in GC? --     Most recent vital signs: Vitals:   08/14/21 1653 08/14/21 1909  BP:  (!) 102/54  Pulse:  (!) 117  Resp:  18  Temp: (!) 103.1 F (39.5 C) (!) 102.7 F (39.3 C)  SpO2:  96%     General: Awake, no distress.  CV:  Good peripheral perfusion.  Tachycardic. Resp:  Normal effort.  Abd:  No distention.  Marked tenderness throughout the right lower quadrant and right upper quadrant.  No overt Murphy's. Other:  Dry mucous membranes.   ED Results / Procedures / Treatments   Labs (all labs ordered are listed, but only abnormal results are displayed) Labs Reviewed  COMPREHENSIVE METABOLIC PANEL - Abnormal; Notable for the following components:       Result Value   CO2 20 (*)    All other components within normal limits  CBC - Abnormal; Notable for the following components:   WBC 12.0 (*)    All other components within normal limits  URINALYSIS, ROUTINE W REFLEX MICROSCOPIC - Abnormal; Notable for the following components:   Color, Urine YELLOW (*)    APPearance HAZY (*)    All other components within normal limits  LACTIC ACID, PLASMA - Abnormal; Notable for the following components:   Lactic Acid, Venous 3.8 (*)    All other components within normal limits  CULTURE, BLOOD (SINGLE)  WET PREP, GENITAL  CHLAMYDIA/NGC RT PCR ([facility] ONLY)            CULTURE, BLOOD (SINGLE)  LIPASE, BLOOD  PREGNANCY, URINE  LACTIC ACID, PLASMA     EKG    RADIOLOGY CT abdomen/pelvis: CT abdomen/pelvis: Tubular cystic structures in the right adnexa possible hydrosalpinx, no bowel obstruction, appendix is normal CT pelvis: Normal appearance of uterus and ovaries, fluid collection posterior to the uterus   I  also independently reviewed and agree with radiologist interpretations.   PROCEDURES:  Critical Care performed: Yes, see critical care procedure note(s)  .Critical Care  Performed by: Shaune Pollack, MD Authorized by: Shaune Pollack, MD   Critical care provider statement:    Critical care time (minutes):  30   Critical care time was exclusive of:  Separately billable procedures and treating other patients   Critical care was necessary to treat or prevent imminent or life-threatening deterioration of the following conditions:  Circulatory failure, cardiac failure, respiratory failure and sepsis   Critical care was time spent personally by me on the following activities:  Blood draw for specimens, discussions with primary provider, evaluation of patient's response to treatment, examination of patient, ordering and performing treatments and interventions, ordering and review of laboratory studies, ordering and review of radiographic  studies and re-evaluation of patient's condition   I assumed direction of critical care for this patient from another provider in my specialty: no     Care discussed with: admitting provider       MEDICATIONS ORDERED IN ED: Medications  cefoTEtan (CEFOTAN) 2 g in sodium chloride 0.9 % 100 mL IVPB (0 g Intravenous Stopped 08/14/21 1903)  doxycycline (VIBRAMYCIN) 100 mg in sodium chloride 0.9 % 250 mL IVPB (100 mg Intravenous New Bag/Given 08/14/21 1908)  morphine (PF) 4 MG/ML injection 4 mg (has no administration in time range)  morphine (PF) 4 MG/ML injection 4 mg (4 mg Intravenous Given 08/14/21 1721)  sodium chloride 0.9 % bolus 1,000 mL (0 mLs Intravenous Stopped 08/14/21 1813)  metoCLOPramide (REGLAN) injection 10 mg (10 mg Intravenous Given 08/14/21 1722)  diphenhydrAMINE (BENADRYL) injection 25 mg (25 mg Intravenous Given 08/14/21 1721)  iohexol (OMNIPAQUE) 300 MG/ML solution 100 mL (100 mLs Intravenous Contrast Given 08/14/21 1659)  acetaminophen (TYLENOL) tablet 1,000 mg (1,000 mg Oral Given 08/14/21 1811)  sodium chloride 0.9 % bolus 1,000 mL (1,000 mLs Intravenous New Bag/Given 08/14/21 1923)  sodium chloride 0.9 % bolus 1,000 mL (1,000 mLs Intravenous New Bag/Given 08/14/21 1923)     IMPRESSION / MDM / ASSESSMENT AND PLAN / ED COURSE  I reviewed the triage vital signs and the nursing notes.                               The patient is on the cardiac monitor to evaluate for evidence of arrhythmia and/or significant heart rate changes.   Ddx:  Differential includes the following, with pertinent life- or limb-threatening emergencies considered:  Sepsis 2/2 appendicitis, colitis, enteritis, UTI, pyelo, PID, food-borne illness  Patient's presentation is most consistent with acute presentation with potential threat to life or bodily function.  MDM:  36 year old fairly healthy female here with fever, nausea, diarrhea, lower abdominal pain.  Patient febrile, tachycardic on arrival.  Given  otherwise healthy history, initial concern for possible viral illness.  Fluids started, symptomatic control, and patient sent for CT.  CT scan shows no normal appendix, but possible hydrosalpinx on the right.  She does have reported vaginal discharge.  She has cervical motion tenderness.  We will begin empiric treatment for PID with possible concomitant sepsis.  Ultrasound obtained, fortunately shows no pyosalpinx or tubo-ovarian abscess.  Small amount of free fluid is likely related to her PID.  Lab work shows leukocytosis, lactic acidosis consistent with sepsis.  IV fluids given.  Broad-spectrum antibiotics given.  Given persistent sepsis physiology with tachycardia, will admit.  She is mentating well.  Blood pressure remains with MAP greater than 65.  No evidence of shock.  Repeat lactic acid will be sent after IV fluids.   MEDICATIONS GIVEN IN ED: Medications  cefoTEtan (CEFOTAN) 2 g in sodium chloride 0.9 % 100 mL IVPB (0 g Intravenous Stopped 08/14/21 1903)  doxycycline (VIBRAMYCIN) 100 mg in sodium chloride 0.9 % 250 mL IVPB (100 mg Intravenous New Bag/Given 08/14/21 1908)  morphine (PF) 4 MG/ML injection 4 mg (has no administration in time range)  morphine (PF) 4 MG/ML injection 4 mg (4 mg Intravenous Given 08/14/21 1721)  sodium chloride 0.9 % bolus 1,000 mL (0 mLs Intravenous Stopped 08/14/21 1813)  metoCLOPramide (REGLAN) injection 10 mg (10 mg Intravenous Given 08/14/21 1722)  diphenhydrAMINE (BENADRYL) injection 25 mg (25 mg Intravenous Given 08/14/21 1721)  iohexol (OMNIPAQUE) 300 MG/ML solution 100 mL (100 mLs Intravenous Contrast Given 08/14/21 1659)  acetaminophen (TYLENOL) tablet 1,000 mg (1,000 mg Oral Given 08/14/21 1811)  sodium chloride 0.9 % bolus 1,000 mL (1,000 mLs Intravenous New Bag/Given 08/14/21 1923)  sodium chloride 0.9 % bolus 1,000 mL (1,000 mLs Intravenous New Bag/Given 08/14/21 1923)     Consults:     EMR reviewed       FINAL CLINICAL IMPRESSION(S) / ED DIAGNOSES    Final diagnoses:  Sepsis without acute organ dysfunction, due to unspecified organism [facility])     Rx / DC Orders   ED Discharge Orders     None        Note:  This document was prepared using Dragon voice recognition software and may include unintentional dictation errors.   Shaune Pollack, MD 08/14/21 2025

## 2021-08-14 NOTE — Assessment & Plan Note (Signed)
Will hold home antihypertensives tonight as patient had a borderline low BP in the ED of 102/54.  To resume as appropriate

## 2021-08-14 NOTE — Progress Notes (Signed)
Pharmacy Antibiotic Note  Janet [name] is a 36 y.o. female admitted on 08/14/2021 with  intraabdominal infection .  Pharmacy has been consulted for Zosyn dosing.  Renal function stable and consistent with baseline.  Plan: Zosyn 3.375g IV q8h (4 hour infusion).  Monitor renal function and clinical course.  Height: 5\' 9"  (175.3 cm) Weight: 118.4 kg (261 lb) IBW/kg (Calculated) : 66.2  Temp (24hrs), Avg:101.8 F (38.8 C), Min:100.4 F (38 C), Max:103.1 F (39.5 C)  Recent Labs  Lab 08/14/21 1402  WBC 12.0*  CREATININE 0.78    Estimated Creatinine Clearance: 132.4 mL/min (by C-G formula based on SCr of 0.78 mg/dL).    Allergies  Allergen Reactions   Flagyl [Metronidazole] Itching   Ibuprofen Swelling    Facial swelling   Nsaids Other (See Comments)    Stomach ulcers   Ondansetron Hcl Other (See Comments)    Pt states "it made me crazy" Pt states "it made me crazy" Pt states "it made me crazy"   Other     Other reaction(s): Other (See Comments) Stomach ulcers   Levofloxacin In D5w Rash and Itching    Antimicrobials this admission: 8/2 Zosyn >>   Dose adjustments this admission: N/a  Microbiology results: 8/2 BCx: to be collected  Thank you for allowing pharmacy to be a part of this patient's care.  10/14/21 08/14/2021 5:04 PM

## 2021-08-15 ENCOUNTER — Encounter: Payer: Self-pay | Admitting: Internal Medicine

## 2021-08-15 DIAGNOSIS — E876 Hypokalemia: Secondary | ICD-10-CM | POA: Diagnosis not present

## 2021-08-15 DIAGNOSIS — N730 Acute parametritis and pelvic cellulitis: Secondary | ICD-10-CM | POA: Diagnosis not present

## 2021-08-15 DIAGNOSIS — A419 Sepsis, unspecified organism: Secondary | ICD-10-CM

## 2021-08-15 DIAGNOSIS — A039 Shigellosis, unspecified: Secondary | ICD-10-CM | POA: Diagnosis present

## 2021-08-15 LAB — BASIC METABOLIC PANEL
Anion gap: 9 (ref 5–15)
BUN: 13 mg/dL (ref 6–20)
CO2: 17 mmol/L — ABNORMAL LOW (ref 22–32)
Calcium: 7.7 mg/dL — ABNORMAL LOW (ref 8.9–10.3)
Chloride: 112 mmol/L — ABNORMAL HIGH (ref 98–111)
Creatinine, Ser: 0.95 mg/dL (ref 0.44–1.00)
GFR, Estimated: >60 mL/min (ref >60)
Glucose, Bld: 136 mg/dL — ABNORMAL HIGH (ref 70–99)
Potassium: 3.4 mmol/L — ABNORMAL LOW (ref 3.5–5.1)
Sodium: 138 mmol/L (ref 135–145)

## 2021-08-15 LAB — GASTROINTESTINAL PANEL BY PCR, STOOL (REPLACES STOOL CULTURE)

## 2021-08-15 LAB — CBC
HCT: 34.9 % — ABNORMAL LOW (ref 36.0–46.0)
Hemoglobin: 12.1 g/dL (ref 12.0–15.0)
MCH: 28.3 pg (ref 26.0–34.0)
MCHC: 34.7 g/dL (ref 30.0–36.0)
MCV: 81.5 fL (ref 80.0–100.0)
Platelets: 169 10*3/uL (ref 150–400)
RBC: 4.28 MIL/uL (ref 3.87–5.11)
RDW: 13.1 % (ref 11.5–15.5)
WBC: 6.5 10*3/uL (ref 4.0–10.5)
nRBC: 0 % (ref 0.0–0.2)

## 2021-08-15 LAB — MAGNESIUM: Magnesium: 1.2 mg/dL — ABNORMAL LOW (ref 1.7–2.4)

## 2021-08-15 LAB — PHOSPHORUS: Phosphorus: 4.1 mg/dL (ref 2.5–4.6)

## 2021-08-15 MED ORDER — ENSURE ENLIVE PO LIQD
237.0000 mL | Freq: Two times a day (BID) | ORAL | Status: DC
  Administered 2021-08-15 – 2021-08-16 (×3): 237 mL via ORAL

## 2021-08-15 MED ORDER — POTASSIUM CHLORIDE CRYS ER 20 MEQ PO TBCR
40.0000 meq | EXTENDED_RELEASE_TABLET | Freq: Once | ORAL | Status: AC
  Administered 2021-08-15: 40 meq via ORAL
  Filled 2021-08-15: qty 2

## 2021-08-15 MED ORDER — METOCLOPRAMIDE HCL 5 MG/ML IJ SOLN
10.0000 mg | Freq: Once | INTRAMUSCULAR | Status: AC
  Administered 2021-08-15: 10 mg via INTRAVENOUS
  Filled 2021-08-15: qty 2

## 2021-08-15 MED ORDER — ORAL CARE MOUTH RINSE
15.0000 mL | OROMUCOSAL | Status: DC | PRN
Start: 2021-08-15 — End: 2021-08-17

## 2021-08-15 MED ORDER — SIMETHICONE 80 MG PO CHEW
80.0000 mg | CHEWABLE_TABLET | Freq: Four times a day (QID) | ORAL | Status: DC | PRN
  Administered 2021-08-15 (×2): 80 mg via ORAL
  Filled 2021-08-15 (×3): qty 1

## 2021-08-15 MED ORDER — MAGNESIUM SULFATE 4 GM/100ML IV SOLN
4.0000 g | Freq: Once | INTRAVENOUS | Status: AC
Start: 2021-08-15 — End: 2021-08-15
  Administered 2021-08-15: 4 g via INTRAVENOUS
  Filled 2021-08-15: qty 100

## 2021-08-15 MED ORDER — KCL-LACTATED RINGERS 20 MEQ/L IV SOLN
INTRAVENOUS | Status: DC
Start: 2021-08-15 — End: 2021-08-15

## 2021-08-15 MED ORDER — POTASSIUM CHLORIDE 2 MEQ/ML IV SOLN
INTRAVENOUS | Status: DC
  Filled 2021-08-15 (×5): qty 1000

## 2021-08-15 NOTE — Progress Notes (Signed)
Sepsis tracking by eLINK 

## 2021-08-15 NOTE — TOC Initial Note (Signed)
Transition of Care [facility]) - Initial/Assessment Note    Patient Details  Name: Janet [name] MRN: [id number] Date of Birth: 23-Dec-1985  [facility]) CM/SW Contact:    Marlowe Sax, RN Phone Number: 08/15/2021, 8:26 AM  Clinical Narrative:                  Transition of Care [facility]) Screening Note   Patient Details  Name: Janet [name] Date of Birth: 04/19/1985   Transition of Care [facility]) CM/SW Contact:    Marlowe Sax, RN Phone Number: 08/15/2021, 8:26 AM    Transition of Care Department [facility]) has reviewed patient and no TOC needs have been identified at this time. We will continue to monitor patient advancement through interdisciplinary progression rounds. If new patient transition needs arise, please place a TOC consult.          Patient Goals and CMS Choice        Expected Discharge Plan and Services                                                Prior Living Arrangements/Services                       Activities of Daily Living Home Assistive Devices/Equipment: None ADL Screening (condition at time of admission) Patient's cognitive ability adequate to safely complete daily activities?: Yes Is the patient deaf or have difficulty hearing?: No Does the patient have difficulty seeing, even when wearing glasses/contacts?: No Does the patient have difficulty concentrating, remembering, or making decisions?: Yes Patient able to express need for assistance with ADLs?: Yes Does the patient have difficulty dressing or bathing?: No Independently performs ADLs?: Yes (appropriate for developmental age) Does the patient have difficulty walking or climbing stairs?: No Weakness of Legs: None Weakness of Arms/Hands: None  Permission Sought/Granted                  Emotional Assessment              Admission diagnosis:  Sepsis (HCC) [A41.9] Sepsis without acute organ dysfunction, due to unspecified organism [facility])  [A41.9] Patient Active Problem List   Diagnosis Date Noted   Sepsis (HCC) 08/14/2021   PID (acute pelvic inflammatory disease) 08/14/2021   Vomiting and diarrhea 08/14/2021   Headache    Bilateral hearing loss 07/08/2021   Abdominal pain 05/06/2021   Vaginal discharge 01/09/2021   BMI 38.0-38.9,adult 11/05/2020   Bilateral knee pain 10/22/2020   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   Left wrist pain 06/18/2020   Skin tag 05/11/2020   Dizziness 04/24/2020   Essential hypertension 04/24/2020   Depression, recurrent (HCC) 12/20/2019   Vitamin D deficiency 12/20/2019   Insomnia 12/20/2019   Tension headache 06/15/2019   Hot flashes 04/06/2019   Current moderate episode of major depressive disorder without prior episode (HCC) 04/06/2019   Psychophysiological insomnia 04/06/2019   Chronic bilateral low back pain without sciatica 11/26/2017   Right wrist pain 11/26/2017   Finger pain, left 11/26/2017   Contracture of finger joint, 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

## 2021-08-15 NOTE — Progress Notes (Addendum)
Progress Note    Janet EdingerSylvia Grim  NWG:[id number] DOB: 12/20/1985  DOA: 08/14/2021 PCP: Reece LeaderGanta, Anupa, DO      Brief Narrative:    Medical records reviewed and are as summarized below:  Janet [name] is a 36 y.o. female with medical history significant for hypertension, obesity, headaches, who presented to the hospital with nausea, vomiting, diarrhea and lower abdominal pain.  She also reports a recent vaginal discharge.  She was febrile in the ED with temperature of 102.7 F.  She was tachycardic and lactic acid was 3.8.  CT abdomen was concerning for tubular cystic collection in the right adnexal region likely representing hydrosalpinx and pelvic ultrasound showed possible fluid collection posterior to the uterus.    She was admitted to the hospital for sepsis probably from acute gastroenteritis versus PID.  She was treated with empiric antibiotics and IV fluids.  She had hypomagnesemia that was repleted.       Assessment/Plan:   Principal Problem:   Sepsis (HCC) Active Problems:   PID (acute pelvic inflammatory disease)   Vomiting and diarrhea   Hypomagnesemia   Shigella gastroenteritis   Essential hypertension   Headache   Hypokalemia   Body mass index is 38.54 kg/m.  (Obesity)   Sepsis from Shigella gastroenteritis, probable PID: Continue empiric IV antibiotics for now.  Stool was positive for Shigella.  Chlamydia and Neisseria gonococcus PCR were not detected.  Hypotension: Continue IV fluids  Hypomagnesemia: Replete magnesium with IV magnesium sulfate 4 g.  Hypokalemia: Replete with oral potassium chloride    Diet Order             Diet full liquid Room service appropriate? Yes; Fluid consistency: Thin  Diet effective now                            Consultants: None  Procedures: None    Medications:    enoxaparin (LOVENOX) injection  0.5 mg/kg Subcutaneous Q24H    morphine injection  4 mg Intravenous Once   Continuous  Infusions:  cefoTEtan (CEFOTAN) IV 2 g (08/15/21 0802)   doxycycline (VIBRAMYCIN) IV 100 mg (08/15/21 0845)   lactated ringers 1,000 mL with potassium chloride 20 mEq infusion 75 mL/hr at 08/15/21 1206   magnesium sulfate bolus IVPB 4 g (08/15/21 1207)     Anti-infectives (From admission, onward)    Start     Dose/Rate Route Frequency Ordered Stop   08/15/21 0800  doxycycline (VIBRAMYCIN) 100 mg in sodium chloride 0.9 % 250 mL IVPB        100 mg 125 mL/hr over 120 Minutes Intravenous Every 12 hours 08/14/21 2117     08/14/21 2200  cefoTEtan (CEFOTAN) 1 g in sodium chloride 0.9 % 100 mL IVPB  Status:  Discontinued        1 g 200 mL/hr over 30 Minutes Intravenous Every 8 hours 08/14/21 2117 08/14/21 2132   08/14/21 1800  cefoTEtan (CEFOTAN) 2 g in sodium chloride 0.9 % 100 mL IVPB        2 g 200 mL/hr over 30 Minutes Intravenous Every 12 hours 08/14/21 1739     08/14/21 1800  doxycycline (VIBRAMYCIN) 100 mg in sodium chloride 0.9 % 250 mL IVPB        100 mg 125 mL/hr over 120 Minutes Intravenous  Once 08/14/21 1739 08/14/21 2124   08/14/21 1715  piperacillin-tazobactam (ZOSYN) IVPB 3.375 g  Status:  Discontinued  3.375 g 12.5 mL/hr over 240 Minutes Intravenous Every 8 hours 08/14/21 1709 08/14/21 1735              Family Communication/Anticipated D/C date and plan/Code Status   DVT prophylaxis:      Code Status: Full Code  Family Communication: None Disposition Plan: Plan to discharge home in 1 to 2 days   Status is: Inpatient Remains inpatient appropriate because: On IV fluids and IV antibiotics for sepsis       Subjective:   Interval events noted.  She complains of nausea, vomiting, diarrhea and abdominal pain.  She said vomiting was not much but she has lost count of her diarrhea.  She also has crampy abdominal pain.  No vaginal discharge.  Objective:    Vitals:   08/15/21 0126 08/15/21 0203 08/15/21 0600 08/15/21 0806  BP:  107/65 109/80 (!)  93/57  Pulse:  90 79 85  Resp:  18 (!) 8 17  Temp: 98.9 F (37.2 C) 98.3 F (36.8 C) 98.2 F (36.8 C) 98 F (36.7 C)  TempSrc: Oral Oral Oral Oral  SpO2:  100% 100% 100%  Weight:      Height:       No data found.   Intake/Output Summary (Last 24 hours) at 08/15/2021 1213 Last data filed at 08/15/2021 0748 Gross per 24 hour  Intake 4351.78 ml  Output 700 ml  Net 3651.78 ml   Filed Weights   08/14/21 1347  Weight: 118.4 kg    Exam:  GEN: NAD SKIN: Warm and dry EYES: No pallor or icterus ENT: MMM CV: RRR PULM: CTA B ABD: soft, ND, mid and lower abdominal tenderness, no rebound tenderness or guarding, +BS CNS: AAO x 3, non focal EXT: No edema or tenderness        Data Reviewed:   I have personally reviewed following labs and imaging studies:  Labs: Labs show the following:   Basic Metabolic Panel: Recent Labs  Lab 08/14/21 1402 08/15/21 0217  NA 137 138  K 4.1 3.4*  CL 109 112*  CO2 20* 17*  GLUCOSE 99 136*  BUN 15 13  CREATININE 0.78 0.95  CALCIUM 9.2 7.7*  MG  --  1.2*  PHOS  --  4.1   GFR Estimated Creatinine Clearance: 111.5 mL/min (by C-G formula based on SCr of 0.95 mg/dL). Liver Function Tests: Recent Labs  Lab 08/14/21 1402  AST 29  ALT 23  ALKPHOS 45  BILITOT 1.1  PROT 7.1  ALBUMIN 4.1   Recent Labs  Lab 08/14/21 1402  LIPASE 23   No results for input(s): "AMMONIA" in the last 168 hours. Coagulation profile No results for input(s): "INR", "PROTIME" in the last 168 hours.  CBC: Recent Labs  Lab 08/14/21 1402 08/15/21 0217  WBC 12.0* 6.5  HGB 13.9 12.1  HCT 41.6 34.9*  MCV 83.4 81.5  PLT 178 169   Cardiac Enzymes: No results for input(s): "CKTOTAL", "CKMB", "CKMBINDEX", "TROPONINI" in the last 168 hours. BNP (last 3 results) No results for input(s): "PROBNP" in the last 8760 hours. CBG: No results for input(s): "GLUCAP" in the last 168 hours. D-Dimer: No results for input(s): "DDIMER" in the last 72 hours. Hgb  A1c: No results for input(s): "HGBA1C" in the last 72 hours. Lipid Profile: No results for input(s): "CHOL", "HDL", "LDLCALC", "TRIG", "CHOLHDL", "LDLDIRECT" in the last 72 hours. Thyroid function studies: No results for input(s): "TSH", "T4TOTAL", "T3FREE", "THYROIDAB" in the last 72 hours.  Invalid input(s): "FREET3"  Anemia work up: No results for input(s): "VITAMINB12", "FOLATE", "FERRITIN", "TIBC", "IRON", "RETICCTPCT" in the last 72 hours. Sepsis Labs: Recent Labs  Lab 08/14/21 1402 08/14/21 1835 08/14/21 2217 08/15/21 0217  WBC 12.0*  --   --  6.5  LATICACIDVEN  --  3.8* 3.0*  --     Microbiology Recent Results (from the past 240 hour(s))  Culture, blood (single)     Status: None (Preliminary result)   Collection Time: 08/14/21  7:06 PM   Specimen: BLOOD  Result Value Ref Range Status   Specimen Description BLOOD BLOOD LEFT FOREARM  Final   Special Requests   Final    BOTTLES DRAWN AEROBIC AND ANAEROBIC Blood Culture results may not be optimal due to an inadequate volume of blood received in culture bottles   Culture   Final    NO GROWTH < 12 HOURS Performed at [facility], [address] [place], [place] [address]    Report Status PENDING  Incomplete  Wet prep, genital     Status: None   Collection Time: 08/14/21  8:20 PM  Result Value Ref Range Status   Yeast Wet Prep HPF POC NONE SEEN NONE SEEN Final   Trich, Wet Prep NONE SEEN NONE SEEN Final   Clue Cells Wet Prep HPF POC NONE SEEN NONE SEEN Final   WBC, Wet Prep HPF POC <10 <10 Final   Sperm NONE SEEN  Final    Comment: Performed at [facility], [address] [place], [place] [address]  Chlamydia/NGC rt PCR [facility] only)     Status: None   Collection Time: 08/14/21  8:20 PM   Specimen: Cervical/Vaginal swab; GU  Result Value Ref Range Status   Specimen source GC/Chlam ENDOCERVICAL  Final   Chlamydia Tr NOT DETECTED NOT DETECTED Final   N gonorrhoeae NOT DETECTED NOT DETECTED Final     Comment: (NOTE) This CT/NG assay has not been evaluated in patients with a history of  hysterectomy. Performed at [facility], [address] [place], [place] [address]   Blood culture (single)     Status: None (Preliminary result)   Collection Time: 08/14/21 10:17 PM   Specimen: BLOOD  Result Value Ref Range Status   Specimen Description BLOOD LEFT ASSIST CONTROL  Final   Special Requests IN PEDIATRIC BOTTLE Blood Culture adequate volume  Final   Culture   Final    NO GROWTH < 12 HOURS Performed at [facility], [address] [place], [place] [address]    Report Status PENDING  Incomplete  Gastrointestinal Panel by PCR , Stool     Status: Abnormal   Collection Time: 08/15/21 12:43 AM   Specimen: Stool  Result Value Ref Range Status   Campylobacter species NOT DETECTED NOT DETECTED Final   Plesimonas shigelloides NOT DETECTED NOT DETECTED Final   Salmonella species NOT DETECTED NOT DETECTED Final   Yersinia enterocolitica NOT DETECTED NOT DETECTED Final   Vibrio species NOT DETECTED NOT DETECTED Final   Vibrio cholerae NOT DETECTED NOT DETECTED Final   Enteroaggregative E coli (EAEC) NOT DETECTED NOT DETECTED Final   Enteropathogenic E coli (EPEC) NOT DETECTED NOT DETECTED Final   Enterotoxigenic E coli (ETEC) NOT DETECTED NOT DETECTED Final   Shiga like toxin producing E coli (STEC) NOT DETECTED NOT DETECTED Final   Shigella/Enteroinvasive E coli (EIEC) DETECTED (A) NOT DETECTED Final    Comment: RESULT CALLED TO, READ BACK BY AND VERIFIED WITH: Berneta Levins @ [phone]  ON 08/15/2021.Marland KitchenMarland KitchenTKR    Cryptosporidium NOT DETECTED NOT DETECTED Final   Cyclospora cayetanensis NOT DETECTED NOT DETECTED Final   Entamoeba histolytica NOT DETECTED NOT DETECTED Final   Giardia lamblia NOT DETECTED NOT DETECTED Final   Adenovirus F40/41 NOT DETECTED NOT DETECTED Final   Astrovirus NOT DETECTED NOT DETECTED Final   Norovirus GI/GII NOT DETECTED NOT DETECTED Final    Rotavirus A NOT DETECTED NOT DETECTED Final   Sapovirus (I, II, IV, and V) NOT DETECTED NOT DETECTED Final    Comment: Performed at [facility], [address] [place], [place] [address]    Procedures and diagnostic studies:  US PELVIS (TRANSABDOMINAL ONLY)  Result Date: 08/14/2021 CLINICAL DATA:  Pelvic pain EXAM: TRANSABDOMINAL ULTRASOUND OF PELVIS DOPPLER ULTRASOUND OF OVARIES TECHNIQUE: Transabdominal ultrasound examination of the pelvis was performed including evaluation of the uterus, ovaries, adnexal regions, and pelvic cul-de-sac. Color and duplex Doppler ultrasound was utilized to evaluate blood flow to the ovaries. COMPARISON:  08/14/2021 CT abdomen pelvis FINDINGS: Uterus Measurements: 11.8 x 4.2 x 5.2 cm = volume: 136 mL. No fibroids or other mass visualized. Endometrium Thickness: 6 mm, within normal limits. No focal abnormality visualized. Right ovary Measurements: 2.9 x 2.1 x 2.1 cm = volume: 7 mL. Normal appearance/no adnexal mass. Left ovary Measurements: 4.6 x 2.4 x 3.3 cm = volume: 19 mL. Normal appearance/no adnexal mass. Pulsed Doppler evaluation demonstrates normal low-resistance arterial and venous waveforms in both ovaries. Other: Possible fluid collection posterior to the uterus, which is difficult to evaluate transabdominally. IMPRESSION: 1. Normal appearance of the uterus and ovaries. 2. Possible fluid collection posterior to the uterus, which is difficult to evaluate transabdominally but may be physiologic. Electronically Signed   By: Wiliam Ke M.D.   On: 08/14/2021 19:26   US PELVIC DOPPLER (TORSION R/O OR MASS ARTERIAL FLOW)  Result Date: 08/14/2021 CLINICAL DATA:  Pelvic pain EXAM: TRANSABDOMINAL ULTRASOUND OF PELVIS DOPPLER ULTRASOUND OF OVARIES TECHNIQUE: Transabdominal ultrasound examination of the pelvis was performed including evaluation of the uterus, ovaries, adnexal regions, and pelvic cul-de-sac. Color and duplex Doppler ultrasound was utilized to  evaluate blood flow to the ovaries. COMPARISON:  08/14/2021 CT abdomen pelvis FINDINGS: Uterus Measurements: 11.8 x 4.2 x 5.2 cm = volume: 136 mL. No fibroids or other mass visualized. Endometrium Thickness: 6 mm, within normal limits. No focal abnormality visualized. Right ovary Measurements: 2.9 x 2.1 x 2.1 cm = volume: 7 mL. Normal appearance/no adnexal mass. Left ovary Measurements: 4.6 x 2.4 x 3.3 cm = volume: 19 mL. Normal appearance/no adnexal mass. Pulsed Doppler evaluation demonstrates normal low-resistance arterial and venous waveforms in both ovaries. Other: Possible fluid collection posterior to the uterus, which is difficult to evaluate transabdominally. IMPRESSION: 1. Normal appearance of the uterus and ovaries. 2. Possible fluid collection posterior to the uterus, which is difficult to evaluate transabdominally but may be physiologic. Electronically Signed   By: Wiliam Ke M.D.   On: 08/14/2021 19:26   CT ABDOMEN PELVIS W CONTRAST  Result Date: 08/14/2021 CLINICAL DATA:  Acute nonlocalized abdominal pain. Right-sided abdominal pain with nausea, vomiting, and diarrhea today. EXAM: CT ABDOMEN AND PELVIS WITH CONTRAST TECHNIQUE: Multidetector CT imaging of the abdomen and pelvis was performed using the standard protocol following bolus administration of intravenous contrast. RADIATION DOSE REDUCTION: This exam was performed according to the departmental dose-optimization program which includes automated exposure control, adjustment of the mA and/or kV according to patient size and/or use of iterative reconstruction technique. CONTRAST:  OMNIPAQUE  IOHEXOL 300 MG/ML  SOLN COMPARISON:  None Available. FINDINGS: Lower chest: Motion artifact in the lung bases. No focal consolidation. Hepatobiliary: No focal liver abnormality is seen. No gallstones, gallbladder wall thickening, or biliary dilatation. Pancreas: Unremarkable. No pancreatic ductal dilatation or surrounding inflammatory changes.  Spleen: Normal in size without focal abnormality. Adrenals/Urinary Tract: Adrenal glands are unremarkable. Kidneys are normal, without renal calculi, focal lesion, or hydronephrosis. Bladder is unremarkable. Stomach/Bowel: Stomach is within normal limits. Appendix appears normal. No evidence of bowel wall thickening, distention, or inflammatory changes. Vascular/Lymphatic: No significant vascular findings are present. No enlarged abdominal or pelvic lymph nodes. Reproductive: Tubular and cystic collections in the right adnexal region likely representing hydrosalpinx. Consider pelvic ultrasound for further evaluation. Uterus and left ovary are not enlarged. Other: No free air or free fluid in the abdomen. Broad-based ventral periumbilical hernia containing small bowel without proximal obstruction. Musculoskeletal: No acute or significant osseous findings. IMPRESSION: 1. Tubular and cystic collections in the right adnexal region likely representing hydrosalpinx. Suggest pelvic ultrasound for further evaluation. 2. No evidence of bowel obstruction or inflammation. Appendix is normal. Electronically Signed   By: Burman Nieves M.D.   On: 08/14/2021 17:10               LOS: 1 day   Haizley Cannella  [facility]   Pager on [email or web address]. If 7PM-7AM, please contact night-coverage at [email or web address]     08/15/2021, 12:13 PM

## 2021-08-15 NOTE — Plan of Care (Signed)

## 2021-08-16 ENCOUNTER — Ambulatory Visit: Payer: 59 | Admitting: Family Medicine

## 2021-08-16 DIAGNOSIS — N730 Acute parametritis and pelvic cellulitis: Secondary | ICD-10-CM | POA: Diagnosis not present

## 2021-08-16 DIAGNOSIS — R6520 Severe sepsis without septic shock: Secondary | ICD-10-CM

## 2021-08-16 DIAGNOSIS — E876 Hypokalemia: Secondary | ICD-10-CM | POA: Diagnosis not present

## 2021-08-16 DIAGNOSIS — A419 Sepsis, unspecified organism: Secondary | ICD-10-CM | POA: Diagnosis not present

## 2021-08-16 LAB — BASIC METABOLIC PANEL
Anion gap: 4 — ABNORMAL LOW (ref 5–15)
BUN: 8 mg/dL (ref 6–20)
CO2: 21 mmol/L — ABNORMAL LOW (ref 22–32)
Calcium: 7.8 mg/dL — ABNORMAL LOW (ref 8.9–10.3)
Chloride: 110 mmol/L (ref 98–111)
Creatinine, Ser: 0.95 mg/dL (ref 0.44–1.00)
GFR, Estimated: >60 mL/min (ref >60)
Glucose, Bld: 95 mg/dL (ref 70–99)
Potassium: 3.1 mmol/L — ABNORMAL LOW (ref 3.5–5.1)
Sodium: 135 mmol/L (ref 135–145)

## 2021-08-16 LAB — PHOSPHORUS: Phosphorus: 1.7 mg/dL — ABNORMAL LOW (ref 2.5–4.6)

## 2021-08-16 LAB — MAGNESIUM: Magnesium: 2.2 mg/dL (ref 1.7–2.4)

## 2021-08-16 MED ORDER — TRAZODONE HCL 50 MG PO TABS
50.0000 mg | ORAL_TABLET | Freq: Every evening | ORAL | Status: DC | PRN
  Administered 2021-08-16: 50 mg via ORAL
  Filled 2021-08-16: qty 1

## 2021-08-16 MED ORDER — POTASSIUM PHOSPHATES 15 MMOLE/5ML IV SOLN
30.0000 mmol | Freq: Once | INTRAVENOUS | Status: AC
  Administered 2021-08-16: 30 mmol via INTRAVENOUS
  Filled 2021-08-16: qty 10

## 2021-08-16 NOTE — Plan of Care (Signed)
  Problem: Education: Goal: Knowledge of General Education information will improve Description: Including pain rating scale, medication(s)/side effects and non-pharmacologic comfort measures 08/16/2021 0129 by Berneta Levins, RN Outcome: Progressing 08/16/2021 0125 by Berneta Levins, RN Outcome: Progressing   Problem: Health Behavior/Discharge Planning: Goal: Ability to manage health-related needs will improve 08/16/2021 0129 by Berneta Levins, RN Outcome: Progressing 08/16/2021 0125 by Berneta Levins, RN Outcome: Progressing   Problem: Clinical Measurements: Goal: Ability to maintain clinical measurements within normal limits will improve 08/16/2021 0129 by Berneta Levins, RN Outcome: Progressing 08/16/2021 0125 by Berneta Levins, RN Outcome: Progressing Goal: Will remain free from infection 08/16/2021 0129 by Berneta Levins, RN Outcome: Progressing 08/16/2021 0125 by Berneta Levins, RN Outcome: Progressing Goal: Diagnostic test results will improve 08/16/2021 0129 by Berneta Levins, RN Outcome: Progressing 08/16/2021 0125 by Berneta Levins, RN Outcome: Progressing Goal: Respiratory complications will improve 08/16/2021 0129 by Berneta Levins, RN Outcome: Progressing 08/16/2021 0125 by Berneta Levins, RN Outcome: Progressing Goal: Cardiovascular complication will be avoided 08/16/2021 0129 by Berneta Levins, RN Outcome: Progressing 08/16/2021 0125 by Berneta Levins, RN Outcome: Progressing   Problem: Activity: Goal: Risk for activity intolerance will decrease 08/16/2021 0129 by Berneta Levins, RN Outcome: Progressing 08/16/2021 0125 by Berneta Levins, RN Outcome: Progressing   Problem: Nutrition: Goal: Adequate nutrition will be maintained 08/16/2021 0129 by Berneta Levins, RN Outcome: Progressing 08/16/2021 0125 by Berneta Levins, RN Outcome: Progressing   Problem:  Coping: Goal: Level of anxiety will decrease 08/16/2021 0129 by Berneta Levins, RN Outcome: Progressing 08/16/2021 0125 by Berneta Levins, RN Outcome: Progressing   Problem: Elimination: Goal: Will not experience complications related to bowel motility 08/16/2021 0129 by Berneta Levins, RN Outcome: Progressing 08/16/2021 0125 by Berneta Levins, RN Outcome: Progressing Goal: Will not experience complications related to urinary retention 08/16/2021 0129 by Berneta Levins, RN Outcome: Progressing 08/16/2021 0125 by Berneta Levins, RN Outcome: Progressing   Problem: Pain Managment: Goal: General experience of comfort will improve 08/16/2021 0129 by Berneta Levins, RN Outcome: Progressing 08/16/2021 0125 by Berneta Levins, RN Outcome: Progressing   Problem: Safety: Goal: Ability to remain free from injury will improve 08/16/2021 0129 by Berneta Levins, RN Outcome: Progressing 08/16/2021 0125 by Berneta Levins, RN Outcome: Progressing   Problem: Skin Integrity: Goal: Risk for impaired skin integrity will decrease 08/16/2021 0129 by Berneta Levins, RN Outcome: Progressing 08/16/2021 0125 by Berneta Levins, RN Outcome: Progressing   Problem: Fluid Volume: Goal: Hemodynamic stability will improve 08/16/2021 0129 by Berneta Levins, RN Outcome: Progressing 08/16/2021 0125 by Berneta Levins, RN Outcome: Progressing   Problem: Clinical Measurements: Goal: Diagnostic test results will improve 08/16/2021 0129 by Berneta Levins, RN Outcome: Progressing 08/16/2021 0125 by Berneta Levins, RN Outcome: Progressing Goal: Signs and symptoms of infection will decrease 08/16/2021 0129 by Berneta Levins, RN Outcome: Progressing 08/16/2021 0125 by Berneta Levins, RN Outcome: Progressing   Problem: Respiratory: Goal: Ability to maintain adequate ventilation will improve 08/16/2021 0129 by  Berneta Levins, RN Outcome: Progressing 08/16/2021 0125 by Berneta Levins, RN Outcome: Progressing

## 2021-08-16 NOTE — Plan of Care (Signed)

## 2021-08-16 NOTE — Progress Notes (Addendum)
Progress Note    Janet [name]  JSE:[id number] DOB: Dec 11, 1985  DOA: 08/14/2021 PCP: [name], DO      Brief Narrative:    Medical records reviewed and are as summarized below:  Janet [name] is a 36 y.o. female with medical history significant for hypertension, obesity, headaches, who presented to the hospital with nausea, vomiting, diarrhea and lower abdominal pain.  She also reports a recent vaginal discharge.  She was febrile in the ED with temperature of 102.7 F.  She was tachycardic and lactic acid was 3.8.  CT abdomen was concerning for tubular cystic collection in the right adnexal region likely representing hydrosalpinx and pelvic ultrasound showed possible fluid collection posterior to the uterus.    She was admitted to the hospital for sepsis probably from acute gastroenteritis versus PID.  She was treated with empiric antibiotics and IV fluids.  She had hypomagnesemia that was repleted.       Assessment/Plan:   Principal Problem:   Severe sepsis (HCC) Active Problems:   PID (acute pelvic inflammatory disease)   Vomiting and diarrhea   Hypomagnesemia   Shigella gastroenteritis   Essential hypertension   Headache   Hypokalemia   Hypophosphatemia   Body mass index is 38.54 kg/m.  (Obesity)   Severe sepsis from Shigella gastroenteritis, probable PID: Lactic acid was 3.8 on admission.  She still has significant diarrhea.  Continue IV cefotetan and doxycycline.  Continue IV fluids for hydration.  Analgesics as needed for pain.  Stool was positive for Shigella.  Chlamydia and Neisseria gonococcus PCR were not detected.  Hypokalemia and hypophosphatemia: Replete with IV potassium phosphate.  Monitor electrolytes  Hypotension: Improved  Hypomagnesemia: Improved      Diet Order             Diet full liquid Room service appropriate? Yes; Fluid consistency: Thin  Diet effective now                             Consultants: None  Procedures: None    Medications:    enoxaparin (LOVENOX) injection  0.5 mg/kg Subcutaneous Q24H   feeding supplement  237 mL Oral BID BM    morphine injection  4 mg Intravenous Once   Continuous Infusions:  cefoTEtan (CEFOTAN) IV 2 g (08/16/21 0800)   doxycycline (VIBRAMYCIN) IV 100 mg (08/16/21 0919)   lactated ringers 1,000 mL with potassium chloride 20 mEq infusion 75 mL/hr at 08/16/21 0547   potassium PHOSPHATE IVPB (in mmol)       Anti-infectives (From admission, onward)    Start     Dose/Rate Route Frequency Ordered Stop   08/15/21 0800  doxycycline (VIBRAMYCIN) 100 mg in sodium chloride 0.9 % 250 mL IVPB        100 mg 125 mL/hr over 120 Minutes Intravenous Every 12 hours 08/14/21 2117     08/14/21 2200  cefoTEtan (CEFOTAN) 1 g in sodium chloride 0.9 % 100 mL IVPB  Status:  Discontinued        1 g 200 mL/hr over 30 Minutes Intravenous Every 8 hours 08/14/21 2117 08/14/21 2132   08/14/21 1800  cefoTEtan (CEFOTAN) 2 g in sodium chloride 0.9 % 100 mL IVPB        2 g 200 mL/hr over 30 Minutes Intravenous Every 12 hours 08/14/21 1739     08/14/21 1800  doxycycline (VIBRAMYCIN) 100 mg in sodium chloride 0.9 % 250 mL IVPB  100 mg 125 mL/hr over 120 Minutes Intravenous  Once 08/14/21 1739 08/14/21 2124   08/14/21 1715  piperacillin-tazobactam (ZOSYN) IVPB 3.375 g  Status:  Discontinued        3.375 g 12.5 mL/hr over 240 Minutes Intravenous Every 8 hours 08/14/21 1709 08/14/21 1735              Family Communication/Anticipated D/C date and plan/Code Status   DVT prophylaxis:      Code Status: Full Code  Family Communication: None Disposition Plan: Plan to discharge home in 1 to 2 days   Status is: Inpatient Remains inpatient appropriate because: On IV fluids and IV antibiotics for sepsis, gastroenteritis       Subjective:   She complains of watery diarrhea and abdominal pain.  Stools are yellowish in  color.  No bloody stools.  No vomiting.   Objective:    Vitals:   08/15/21 2010 08/16/21 0015 08/16/21 0317 08/16/21 0727  BP: 125/81 121/83 117/80 117/76  Pulse: 94 90 87 81  Resp: 17 17 17 16   Temp: 98 F (36.7 C) 100.3 F (37.9 C) 98.9 F (37.2 C) 98.7 F (37.1 C)  TempSrc:      SpO2: 100% 100% 95% 97%  Weight:      Height:       No data found.   Intake/Output Summary (Last 24 hours) at 08/16/2021 1108 Last data filed at 08/15/2021 1800 Gross per 24 hour  Intake 638.8 ml  Output --  Net 638.8 ml   Filed Weights   08/14/21 1347  Weight: 118.4 kg    Exam:  GEN: NAD SKIN: No rash EYES: EOMI ENT: MMM CV: RRR PULM: CTA B ABD: soft, obese, mid abdominal and lower abdominal tenderness, no rebound tenderness or guarding, +BS CNS: AAO x 3, non focal EXT: No edema or tenderness    GEN: NAD SKIN: Warm and dry EYES: No pallor or icterus ENT: MMM CV: RRR PULM: CTA B ABD: soft, ND, mid and lower abdominal tenderness, no rebound tenderness or guarding, +BS CNS: AAO x 3, non focal EXT: No edema or tenderness        Data Reviewed:   I have personally reviewed following labs and imaging studies:  Labs: Labs show the following:   Basic Metabolic Panel: Recent Labs  Lab 08/14/21 1402 08/15/21 0217 08/16/21 0427  NA 137 138 135  K 4.1 3.4* 3.1*  CL 109 112* 110  CO2 20* 17* 21*  GLUCOSE 99 136* 95  BUN 15 13 8   CREATININE 0.78 0.95 0.95  CALCIUM 9.2 7.7* 7.8*  MG  --  1.2* 2.2  PHOS  --  4.1 1.7*   GFR Estimated Creatinine Clearance: 111.5 mL/min (by C-G formula based on SCr of 0.95 mg/dL). Liver Function Tests: Recent Labs  Lab 08/14/21 1402  AST 29  ALT 23  ALKPHOS 45  BILITOT 1.1  PROT 7.1  ALBUMIN 4.1   Recent Labs  Lab 08/14/21 1402  LIPASE 23   No results for input(s): "AMMONIA" in the last 168 hours. Coagulation profile No results for input(s): "INR", "PROTIME" in the last 168 hours.  CBC: Recent Labs  Lab  08/14/21 1402 08/15/21 0217  WBC 12.0* 6.5  HGB 13.9 12.1  HCT 41.6 34.9*  MCV 83.4 81.5  PLT 178 169   Cardiac Enzymes: No results for input(s): "CKTOTAL", "CKMB", "CKMBINDEX", "TROPONINI" in the last 168 hours. BNP (last 3 results) No results for input(s): "PROBNP" in the last 8760 hours.  CBG: No results for input(s): "GLUCAP" in the last 168 hours. D-Dimer: No results for input(s): "DDIMER" in the last 72 hours. Hgb A1c: No results for input(s): "HGBA1C" in the last 72 hours. Lipid Profile: No results for input(s): "CHOL", "HDL", "LDLCALC", "TRIG", "CHOLHDL", "LDLDIRECT" in the last 72 hours. Thyroid function studies: No results for input(s): "TSH", "T4TOTAL", "T3FREE", "THYROIDAB" in the last 72 hours.  Invalid input(s): "FREET3" Anemia work up: No results for input(s): "VITAMINB12", "FOLATE", "FERRITIN", "TIBC", "IRON", "RETICCTPCT" in the last 72 hours. Sepsis Labs: Recent Labs  Lab 08/14/21 1402 08/14/21 1835 08/14/21 2217 08/15/21 0217  WBC 12.0*  --   --  6.5  LATICACIDVEN  --  3.8* 3.0*  --     Microbiology Recent Results (from the past 240 hour(s))  Culture, blood (single)     Status: None (Preliminary result)   Collection Time: 08/14/21  7:06 PM   Specimen: BLOOD  Result Value Ref Range Status   Specimen Description BLOOD BLOOD LEFT FOREARM  Final   Special Requests   Final    BOTTLES DRAWN AEROBIC AND ANAEROBIC Blood Culture results may not be optimal due to an inadequate volume of blood received in culture bottles   Culture   Final    NO GROWTH 2 DAYS Performed at [facility], [address] [place], [place] [address]    Report Status PENDING  Incomplete  Wet prep, genital     Status: None   Collection Time: 08/14/21  8:20 PM  Result Value Ref Range Status   Yeast Wet Prep HPF POC NONE SEEN NONE SEEN Final   Trich, Wet Prep NONE SEEN NONE SEEN Final   Clue Cells Wet Prep HPF POC NONE SEEN NONE SEEN Final   WBC, Wet Prep HPF POC <10  <10 Final   Sperm NONE SEEN  Final    Comment: Performed at [facility], [address] [place], [place] [address]  Chlamydia/NGC rt PCR [facility] only)     Status: None   Collection Time: 08/14/21  8:20 PM   Specimen: Cervical/Vaginal swab; GU  Result Value Ref Range Status   Specimen source GC/Chlam ENDOCERVICAL  Final   Chlamydia Tr NOT DETECTED NOT DETECTED Final   N gonorrhoeae NOT DETECTED NOT DETECTED Final    Comment: (NOTE) This CT/NG assay has not been evaluated in patients with a history of  hysterectomy. Performed at [facility], [address] [place], [place] [address]   Blood culture (single)     Status: None (Preliminary result)   Collection Time: 08/14/21 10:17 PM   Specimen: BLOOD  Result Value Ref Range Status   Specimen Description BLOOD LEFT ASSIST CONTROL  Final   Special Requests IN PEDIATRIC BOTTLE Blood Culture adequate volume  Final   Culture   Final    NO GROWTH 2 DAYS Performed at [facility], [address] [place], [place] [address]    Report Status PENDING  Incomplete  Gastrointestinal Panel by PCR , Stool     Status: Abnormal   Collection Time: 08/15/21 12:43 AM   Specimen: Stool  Result Value Ref Range Status   Campylobacter species NOT DETECTED NOT DETECTED Final   Plesimonas shigelloides NOT DETECTED NOT DETECTED Final   Salmonella species NOT DETECTED NOT DETECTED Final   Yersinia enterocolitica NOT DETECTED NOT DETECTED Final   Vibrio species NOT DETECTED NOT DETECTED Final   Vibrio cholerae NOT DETECTED NOT DETECTED Final   Enteroaggregative E coli (EAEC) NOT  DETECTED NOT DETECTED Final   Enteropathogenic E coli (EPEC) NOT DETECTED NOT DETECTED Final   Enterotoxigenic E coli (ETEC) NOT DETECTED NOT DETECTED Final   Shiga like toxin producing E coli (STEC) NOT DETECTED NOT DETECTED Final   Shigella/Enteroinvasive E coli (EIEC) DETECTED (A) NOT DETECTED Final    Comment: RESULT CALLED TO, READ BACK BY AND  VERIFIED WITH: DORIS GARCIA LABRADA @ 0229 ON 08/15/2021.Marland KitchenMarland KitchenTKR    Cryptosporidium NOT DETECTED NOT DETECTED Final   Cyclospora cayetanensis NOT DETECTED NOT DETECTED Final   Entamoeba histolytica NOT DETECTED NOT DETECTED Final   Giardia lamblia NOT DETECTED NOT DETECTED Final   Adenovirus F40/41 NOT DETECTED NOT DETECTED Final   Astrovirus NOT DETECTED NOT DETECTED Final   Norovirus GI/GII NOT DETECTED NOT DETECTED Final   Rotavirus A NOT DETECTED NOT DETECTED Final   Sapovirus (I, II, IV, and V) NOT DETECTED NOT DETECTED Final    Comment: Performed at [facility], [address] [place], [place] [address]    Procedures and diagnostic studies:  US PELVIS (TRANSABDOMINAL ONLY)  Result Date: 08/14/2021 CLINICAL DATA:  Pelvic pain EXAM: TRANSABDOMINAL ULTRASOUND OF PELVIS DOPPLER ULTRASOUND OF OVARIES TECHNIQUE: Transabdominal ultrasound examination of the pelvis was performed including evaluation of the uterus, ovaries, adnexal regions, and pelvic cul-de-sac. Color and duplex Doppler ultrasound was utilized to evaluate blood flow to the ovaries. COMPARISON:  08/14/2021 CT abdomen pelvis FINDINGS: Uterus Measurements: 11.8 x 4.2 x 5.2 cm = volume: 136 mL. No fibroids or other mass visualized. Endometrium Thickness: 6 mm, within normal limits. No focal abnormality visualized. Right ovary Measurements: 2.9 x 2.1 x 2.1 cm = volume: 7 mL. Normal appearance/no adnexal mass. Left ovary Measurements: 4.6 x 2.4 x 3.3 cm = volume: 19 mL. Normal appearance/no adnexal mass. Pulsed Doppler evaluation demonstrates normal low-resistance arterial and venous waveforms in both ovaries. Other: Possible fluid collection posterior to the uterus, which is difficult to evaluate transabdominally. IMPRESSION: 1. Normal appearance of the uterus and ovaries. 2. Possible fluid collection posterior to the uterus, which is difficult to evaluate transabdominally but may be physiologic. Electronically Signed   By: Wiliam Ke M.D.   On: 08/14/2021 19:26   US PELVIC DOPPLER (TORSION R/O OR MASS ARTERIAL FLOW)  Result Date: 08/14/2021 CLINICAL DATA:  Pelvic pain EXAM: TRANSABDOMINAL ULTRASOUND OF PELVIS DOPPLER ULTRASOUND OF OVARIES TECHNIQUE: Transabdominal ultrasound examination of the pelvis was performed including evaluation of the uterus, ovaries, adnexal regions, and pelvic cul-de-sac. Color and duplex Doppler ultrasound was utilized to evaluate blood flow to the ovaries. COMPARISON:  08/14/2021 CT abdomen pelvis FINDINGS: Uterus Measurements: 11.8 x 4.2 x 5.2 cm = volume: 136 mL. No fibroids or other mass visualized. Endometrium Thickness: 6 mm, within normal limits. No focal abnormality visualized. Right ovary Measurements: 2.9 x 2.1 x 2.1 cm = volume: 7 mL. Normal appearance/no adnexal mass. Left ovary Measurements: 4.6 x 2.4 x 3.3 cm = volume: 19 mL. Normal appearance/no adnexal mass. Pulsed Doppler evaluation demonstrates normal low-resistance arterial and venous waveforms in both ovaries. Other: Possible fluid collection posterior to the uterus, which is difficult to evaluate transabdominally. IMPRESSION: 1. Normal appearance of the uterus and ovaries. 2. Possible fluid collection posterior to the uterus, which is difficult to evaluate transabdominally but may be physiologic. Electronically Signed   By: Wiliam Ke M.D.   On: 08/14/2021 19:26   CT ABDOMEN PELVIS W CONTRAST  Result Date: 08/14/2021 CLINICAL DATA:  Acute nonlocalized abdominal pain. Right-sided abdominal pain with nausea, vomiting,  and diarrhea today. EXAM: CT ABDOMEN AND PELVIS WITH CONTRAST TECHNIQUE: Multidetector CT imaging of the abdomen and pelvis was performed using the standard protocol following bolus administration of intravenous contrast. RADIATION DOSE REDUCTION: This exam was performed according to the departmental dose-optimization program which includes automated exposure control, adjustment of the mA and/or kV according to patient  size and/or use of iterative reconstruction technique. CONTRAST:  100mL OMNIPAQUE IOHEXOL 300 MG/ML  SOLN COMPARISON:  None Available. FINDINGS: Lower chest: Motion artifact in the lung bases. No focal consolidation. Hepatobiliary: No focal liver abnormality is seen. No gallstones, gallbladder wall thickening, or biliary dilatation. Pancreas: Unremarkable. No pancreatic ductal dilatation or surrounding inflammatory changes. Spleen: Normal in size without focal abnormality. Adrenals/Urinary Tract: Adrenal glands are unremarkable. Kidneys are normal, without renal calculi, focal lesion, or hydronephrosis. Bladder is unremarkable. Stomach/Bowel: Stomach is within normal limits. Appendix appears normal. No evidence of bowel wall thickening, distention, or inflammatory changes. Vascular/Lymphatic: No significant vascular findings are present. No enlarged abdominal or pelvic lymph nodes. Reproductive: Tubular and cystic collections in the right adnexal region likely representing hydrosalpinx. Consider pelvic ultrasound for further evaluation. Uterus and left ovary are not enlarged. Other: No free air or free fluid in the abdomen. Broad-based ventral periumbilical hernia containing small bowel without proximal obstruction. Musculoskeletal: No acute or significant osseous findings. IMPRESSION: 1. Tubular and cystic collections in the right adnexal region likely representing hydrosalpinx. Suggest pelvic ultrasound for further evaluation. 2. No evidence of bowel obstruction or inflammation. Appendix is normal. Electronically Signed   By: Burman NievesWilliam  Stevens M.D.   On: 08/14/2021 17:10               LOS: 2 days   Pawel Soules  [facility]   Pager on [email or web address]. If 7PM-7AM, please contact night-coverage at [email or web address]     08/16/2021, 11:08 AM

## 2021-08-17 DIAGNOSIS — I10 Essential (primary) hypertension: Secondary | ICD-10-CM

## 2021-08-17 DIAGNOSIS — A419 Sepsis, unspecified organism: Secondary | ICD-10-CM | POA: Diagnosis not present

## 2021-08-17 DIAGNOSIS — R197 Diarrhea, unspecified: Secondary | ICD-10-CM

## 2021-08-17 DIAGNOSIS — R1110 Vomiting, unspecified: Secondary | ICD-10-CM

## 2021-08-17 DIAGNOSIS — E876 Hypokalemia: Secondary | ICD-10-CM | POA: Diagnosis not present

## 2021-08-17 LAB — BASIC METABOLIC PANEL
Anion gap: 6 (ref 5–15)
BUN: 6 mg/dL (ref 6–20)
CO2: 22 mmol/L (ref 22–32)
Calcium: 8 mg/dL — ABNORMAL LOW (ref 8.9–10.3)
Chloride: 112 mmol/L — ABNORMAL HIGH (ref 98–111)
Creatinine, Ser: 0.91 mg/dL (ref 0.44–1.00)
GFR, Estimated: >60 mL/min (ref >60)
Glucose, Bld: 100 mg/dL — ABNORMAL HIGH (ref 70–99)
Potassium: 3.3 mmol/L — ABNORMAL LOW (ref 3.5–5.1)
Sodium: 140 mmol/L (ref 135–145)

## 2021-08-17 LAB — LACTIC ACID, PLASMA: Lactic Acid, Venous: 0.7 mmol/L (ref 0.5–1.9)

## 2021-08-17 LAB — PHOSPHORUS: Phosphorus: 2.3 mg/dL — ABNORMAL LOW (ref 2.5–4.6)

## 2021-08-17 LAB — ALBUMIN: Albumin: 3 g/dL — ABNORMAL LOW (ref 3.5–5.0)

## 2021-08-17 LAB — MAGNESIUM: Magnesium: 1.9 mg/dL (ref 1.7–2.4)

## 2021-08-17 MED ORDER — DOXYCYCLINE HYCLATE 100 MG PO CAPS: 100.0000 mg | ORAL_CAPSULE | Freq: Two times a day (BID) | ORAL | 0 refills | Status: AC

## 2021-08-17 MED ORDER — POTASSIUM CHLORIDE CRYS ER 20 MEQ PO TBCR
40.0000 meq | EXTENDED_RELEASE_TABLET | Freq: Once | ORAL | Status: AC
  Administered 2021-08-17: 40 meq via ORAL
  Filled 2021-08-17: qty 2

## 2021-08-17 NOTE — Plan of Care (Signed)

## 2021-08-17 NOTE — Discharge Summary (Signed)
Physician Discharge Summary   Patient: Janet [name] MRN: [id number] DOB: 1985/04/21  Admit date:     08/14/2021  Discharge date: 08/17/21  Discharge Physician: Lurene Shadow   PCP: [name], DO   Recommendations at discharge:   Follow-up with PCP in 1 to 2 weeks Follow-up with gynecologist with [facility] in [place] in 1 week.   Discharge Diagnoses: Principal Problem:   Severe sepsis (HCC) Active Problems:   PID (acute pelvic inflammatory disease)   Vomiting and diarrhea   Hypomagnesemia   Shigella gastroenteritis   Essential hypertension   Headache   Hypokalemia   Hypophosphatemia  Resolved Problems:   * No resolved hospital problems. *  Hospital Course:  Janet [name] is a 36 y.o. female with medical history significant for hypertension, obesity, headaches, who presented to the hospital with nausea, vomiting, diarrhea and lower abdominal pain.  She also reports a recent vaginal discharge.  She was febrile in the ED with temperature of 102.7 F.  She was tachycardic and lactic acid was 3.8.  CT abdomen was concerning for tubular cystic collection in the right adnexal region likely representing hydrosalpinx and pelvic ultrasound showed possible fluid collection posterior to the uterus.    She was admitted to the hospital for sepsis probably from acute gastroenteritis versus PID.  She was treated with empiric antibiotics and IV fluids.  Stool for GI pathogen by PCR showed Shigella.  Cervical/vaginal swab was negative for chlamydia and Neisseria gonorrhea.  She had hypomagnesemia, hypokalemia and hypophosphatemia that were repleted.  Her condition has improved significantly.  Diarrhea has resolved and stools are formed.  She is deemed stable for discharge to home today.  She requested a work note that will excuse her from work through 08/22/2021.        Consultants: None Procedures performed: None  Disposition: Home Diet recommendation:  Discharge Diet  Orders (From admission, onward)     Start     Ordered   08/17/21 0000  Diet - low sodium heart healthy        08/17/21 1232           Cardiac diet DISCHARGE MEDICATION: Allergies as of 08/17/2021       Reactions   Flagyl [metronidazole] Itching   Ibuprofen Swelling   Facial swelling   Nsaids Other (See Comments)   Stomach ulcers   Ondansetron Hcl Other (See Comments)   Pt states "it made me crazy" Pt states "it made me crazy" Pt states "it made me crazy"   Other    Other reaction(s): Other (See Comments) Stomach ulcers   Levofloxacin In D5w Rash, Itching        Medication List     TAKE these medications    amLODipine 10 MG tablet Commonly known as: NORVASC Take 1 tablet (10 mg total) by mouth at bedtime.   doxycycline 100 MG capsule Commonly known as: VIBRAMYCIN Take 1 capsule (100 mg total) by mouth 2 (two) times daily for 4 days.   NEXPLANON [place] Inject into the skin.        Discharge Exam: Filed Weights   08/14/21 1347  Weight: 118.4 kg   GEN: NAD SKIN: No rash EYES: EOMI ENT: MMM CV: RRR PULM: CTA B ABD: soft, obese, NT, +BS CNS: AAO x 3, non focal EXT: No edema or tenderness   Condition at discharge: good  The results of significant diagnostics from this hospitalization (including imaging, microbiology, ancillary and laboratory) are listed below for reference.  Imaging Studies: US PELVIS (TRANSABDOMINAL ONLY)  Result Date: 08/14/2021 CLINICAL DATA:  Pelvic pain EXAM: TRANSABDOMINAL ULTRASOUND OF PELVIS DOPPLER ULTRASOUND OF OVARIES TECHNIQUE: Transabdominal ultrasound examination of the pelvis was performed including evaluation of the uterus, ovaries, adnexal regions, and pelvic cul-de-sac. Color and duplex Doppler ultrasound was utilized to evaluate blood flow to the ovaries. COMPARISON:  08/14/2021 CT abdomen pelvis FINDINGS: Uterus Measurements: 11.8 x 4.2 x 5.2 cm = volume: 136 mL. No fibroids or other mass visualized. Endometrium  Thickness: 6 mm, within normal limits. No focal abnormality visualized. Right ovary Measurements: 2.9 x 2.1 x 2.1 cm = volume: 7 mL. Normal appearance/no adnexal mass. Left ovary Measurements: 4.6 x 2.4 x 3.3 cm = volume: 19 mL. Normal appearance/no adnexal mass. Pulsed Doppler evaluation demonstrates normal low-resistance arterial and venous waveforms in both ovaries. Other: Possible fluid collection posterior to the uterus, which is difficult to evaluate transabdominally. IMPRESSION: 1. Normal appearance of the uterus and ovaries. 2. Possible fluid collection posterior to the uterus, which is difficult to evaluate transabdominally but may be physiologic. Electronically Signed   By: Wiliam Ke M.D.   On: 08/14/2021 19:26   US PELVIC DOPPLER (TORSION R/O OR MASS ARTERIAL FLOW)  Result Date: 08/14/2021 CLINICAL DATA:  Pelvic pain EXAM: TRANSABDOMINAL ULTRASOUND OF PELVIS DOPPLER ULTRASOUND OF OVARIES TECHNIQUE: Transabdominal ultrasound examination of the pelvis was performed including evaluation of the uterus, ovaries, adnexal regions, and pelvic cul-de-sac. Color and duplex Doppler ultrasound was utilized to evaluate blood flow to the ovaries. COMPARISON:  08/14/2021 CT abdomen pelvis FINDINGS: Uterus Measurements: 11.8 x 4.2 x 5.2 cm = volume: 136 mL. No fibroids or other mass visualized. Endometrium Thickness: 6 mm, within normal limits. No focal abnormality visualized. Right ovary Measurements: 2.9 x 2.1 x 2.1 cm = volume: 7 mL. Normal appearance/no adnexal mass. Left ovary Measurements: 4.6 x 2.4 x 3.3 cm = volume: 19 mL. Normal appearance/no adnexal mass. Pulsed Doppler evaluation demonstrates normal low-resistance arterial and venous waveforms in both ovaries. Other: Possible fluid collection posterior to the uterus, which is difficult to evaluate transabdominally. IMPRESSION: 1. Normal appearance of the uterus and ovaries. 2. Possible fluid collection posterior to the uterus, which is difficult to  evaluate transabdominally but may be physiologic. Electronically Signed   By: Wiliam Ke M.D.   On: 08/14/2021 19:26   CT ABDOMEN PELVIS W CONTRAST  Result Date: 08/14/2021 CLINICAL DATA:  Acute nonlocalized abdominal pain. Right-sided abdominal pain with nausea, vomiting, and diarrhea today. EXAM: CT ABDOMEN AND PELVIS WITH CONTRAST TECHNIQUE: Multidetector CT imaging of the abdomen and pelvis was performed using the standard protocol following bolus administration of intravenous contrast. RADIATION DOSE REDUCTION: This exam was performed according to the departmental dose-optimization program which includes automated exposure control, adjustment of the mA and/or kV according to patient size and/or use of iterative reconstruction technique. CONTRAST:  OMNIPAQUE IOHEXOL 300 MG/ML  SOLN COMPARISON:  None Available. FINDINGS: Lower chest: Motion artifact in the lung bases. No focal consolidation. Hepatobiliary: No focal liver abnormality is seen. No gallstones, gallbladder wall thickening, or biliary dilatation. Pancreas: Unremarkable. No pancreatic ductal dilatation or surrounding inflammatory changes. Spleen: Normal in size without focal abnormality. Adrenals/Urinary Tract: Adrenal glands are unremarkable. Kidneys are normal, without renal calculi, focal lesion, or hydronephrosis. Bladder is unremarkable. Stomach/Bowel: Stomach is within normal limits. Appendix appears normal. No evidence of bowel wall thickening, distention, or inflammatory changes. Vascular/Lymphatic: No significant vascular findings are present. No enlarged abdominal or pelvic lymph nodes. Reproductive: Tubular and  cystic collections in the right adnexal region likely representing hydrosalpinx. Consider pelvic ultrasound for further evaluation. Uterus and left ovary are not enlarged. Other: No free air or free fluid in the abdomen. Broad-based ventral periumbilical hernia containing small bowel without proximal obstruction.  Musculoskeletal: No acute or significant osseous findings. IMPRESSION: 1. Tubular and cystic collections in the right adnexal region likely representing hydrosalpinx. Suggest pelvic ultrasound for further evaluation. 2. No evidence of bowel obstruction or inflammation. Appendix is normal. Electronically Signed   By: Burman Nieves M.D.   On: 08/14/2021 17:10    Microbiology: Results for orders placed or performed during the hospital encounter of 08/14/21  Culture, blood (single)     Status: None (Preliminary result)   Collection Time: 08/14/21  7:06 PM   Specimen: BLOOD  Result Value Ref Range Status   Specimen Description BLOOD BLOOD LEFT FOREARM  Final   Special Requests   Final    BOTTLES DRAWN AEROBIC AND ANAEROBIC Blood Culture results may not be optimal due to an inadequate volume of blood received in culture bottles   Culture   Final    NO GROWTH 3 DAYS Performed at [facility], [address] [place], [place] [address]    Report Status PENDING  Incomplete  Wet prep, genital     Status: None   Collection Time: 08/14/21  8:20 PM  Result Value Ref Range Status   Yeast Wet Prep HPF POC NONE SEEN NONE SEEN Final   Trich, Wet Prep NONE SEEN NONE SEEN Final   Clue Cells Wet Prep HPF POC NONE SEEN NONE SEEN Final   WBC, Wet Prep HPF POC <10 <10 Final   Sperm NONE SEEN  Final    Comment: Performed at [facility], [address] [place], [place] [address]  Chlamydia/NGC rt PCR [facility] only)     Status: None   Collection Time: 08/14/21  8:20 PM   Specimen: Cervical/Vaginal swab; GU  Result Value Ref Range Status   Specimen source GC/Chlam ENDOCERVICAL  Final   Chlamydia Tr NOT DETECTED NOT DETECTED Final   N gonorrhoeae NOT DETECTED NOT DETECTED Final    Comment: (NOTE) This CT/NG assay has not been evaluated in patients with a history of  hysterectomy. Performed at [facility], [address] [place], [place] [address]   Blood culture (single)      Status: None (Preliminary result)   Collection Time: 08/14/21 10:17 PM   Specimen: BLOOD  Result Value Ref Range Status   Specimen Description BLOOD LEFT ASSIST CONTROL  Final   Special Requests IN PEDIATRIC BOTTLE Blood Culture adequate volume  Final   Culture   Final    NO GROWTH 3 DAYS Performed at [facility], [address] [place], [place] [address]    Report Status PENDING  Incomplete  Gastrointestinal Panel by PCR , Stool     Status: Abnormal   Collection Time: 08/15/21 12:43 AM   Specimen: Stool  Result Value Ref Range Status   Campylobacter species NOT DETECTED NOT DETECTED Final   Plesimonas shigelloides NOT DETECTED NOT DETECTED Final   Salmonella species NOT DETECTED NOT DETECTED Final   Yersinia enterocolitica NOT DETECTED NOT DETECTED Final   Vibrio species NOT DETECTED NOT DETECTED Final   Vibrio cholerae NOT DETECTED NOT DETECTED Final   Enteroaggregative E coli (EAEC) NOT DETECTED NOT DETECTED Final   Enteropathogenic E coli (EPEC) NOT DETECTED NOT DETECTED Final   Enterotoxigenic E coli (ETEC) NOT  DETECTED NOT DETECTED Final   Shiga like toxin producing E coli (STEC) NOT DETECTED NOT DETECTED Final   Shigella/Enteroinvasive E coli (EIEC) DETECTED (A) NOT DETECTED Final    Comment: RESULT CALLED TO, READ BACK BY AND VERIFIED WITH: DORIS GARCIA LABRADA @ 0229 ON 08/15/2021.Marland KitchenMarland KitchenTKR    Cryptosporidium NOT DETECTED NOT DETECTED Final   Cyclospora cayetanensis NOT DETECTED NOT DETECTED Final   Entamoeba histolytica NOT DETECTED NOT DETECTED Final   Giardia lamblia NOT DETECTED NOT DETECTED Final   Adenovirus F40/41 NOT DETECTED NOT DETECTED Final   Astrovirus NOT DETECTED NOT DETECTED Final   Norovirus GI/GII NOT DETECTED NOT DETECTED Final   Rotavirus A NOT DETECTED NOT DETECTED Final   Sapovirus (I, II, IV, and V) NOT DETECTED NOT DETECTED Final    Comment: Performed at [facility], [address] [place], [place] [address]     Labs: CBC: Recent Labs  Lab 08/14/21 1402 08/15/21 0217  WBC 12.0* 6.5  HGB 13.9 12.1  HCT 41.6 34.9*  MCV 83.4 81.5  PLT 178 169   Basic Metabolic Panel: Recent Labs  Lab 08/14/21 1402 08/15/21 0217 08/16/21 0427 08/17/21 0447  NA 137 138 135 140  K 4.1 3.4* 3.1* 3.3*  CL 109 112* 110 112*  CO2 20* 17* 21* 22  GLUCOSE 99 136* 95 100*  BUN 15 13 8 6   CREATININE 0.78 0.95 0.95 0.91  CALCIUM 9.2 7.7* 7.8* 8.0*  MG  --  1.2* 2.2 1.9  PHOS  --  4.1 1.7* 2.3*   Liver Function Tests: Recent Labs  Lab 08/14/21 1402 08/17/21 0447  AST 29  --   ALT 23  --   ALKPHOS 45  --   BILITOT 1.1  --   PROT 7.1  --   ALBUMIN 4.1 3.0*   CBG: No results for input(s): "GLUCAP" in the last 168 hours.  Discharge time spent: greater than 30 minutes.  Signed: 10/17/21, MD [facility] 08/17/2021

## 2021-08-19 LAB — CULTURE, BLOOD (SINGLE)
Culture: NO GROWTH
Culture: NO GROWTH
Special Requests: ADEQUATE

## 2021-08-20 ENCOUNTER — Ambulatory Visit (INDEPENDENT_AMBULATORY_CARE_PROVIDER_SITE_OTHER): Payer: 59 | Admitting: Family Medicine

## 2021-08-20 ENCOUNTER — Encounter: Payer: Self-pay | Admitting: Family Medicine

## 2021-08-20 VITALS — BP 125/88 | HR 78 | Ht 69.0 in | Wt 253.0 lb

## 2021-08-20 DIAGNOSIS — E878 Other disorders of electrolyte and fluid balance, not elsewhere classified: Secondary | ICD-10-CM | POA: Diagnosis not present

## 2021-08-20 DIAGNOSIS — A039 Shigellosis, unspecified: Secondary | ICD-10-CM

## 2021-08-20 DIAGNOSIS — N898 Other specified noninflammatory disorders of vagina: Secondary | ICD-10-CM | POA: Diagnosis not present

## 2021-08-20 NOTE — Progress Notes (Unsigned)
    SUBJECTIVE:   CHIEF COMPLAINT / HPI:   Hospital Follow-Up Admitted to [facility] from 8/2 - 8/5 for sepsis secondary to gastroenteritis vs PID. GI pathogen panel positive for Shigella. Treated with IV fluids and antibiotics. Had electrolyte derangements (hypomag, hypokalemia, hypophosphatemia) which improved by the time of discharge.  Remains on doxycycline (to complete 7 day total course). Today reports she is significantly improved. Still with some generalized weakness and lower abdominal pain but these are minimal compared to prior. No further diarrhea. No fevers.   Wants to recheck electrolytes. Was also told she needs to see OBGYN-- requests referral to [facility]  PERTINENT  PMH / PSH: HTN, depression, headaches  OBJECTIVE:   BP 125/88   Pulse 78   Ht 5\' 9"  (1.753 m)   Wt 253 lb (114.8 kg)   SpO2 98%   BMI 37.36 kg/m   Gen: NAD, pleasant, able to participate in exam HEENT: [place]/AT, PERRLA, nares patent bilaterally, TM normal bilaterally, oropharynx unremarkable Neck: supple CV: RRR, normal S1/S2, no murmur Resp: Normal effort, lungs CTAB GI: Bowel sounds present, abdomen soft, non-tender, non-distended Extremities: no edema or cyanosis Skin: warm and dry, no rashes noted Neuro: alert, no obvious focal deficits Psych: Normal affect and mood   ASSESSMENT/PLAN:   Shigella Gastroenteritis with Electrolyte Derangements Clinically much improved. Will recheck CBC with diff, CMP, and mag today to ensure resolution of electrolyte abnormalities.   Referral placed to OBGYN [facility] per patient request) given concern for PID during hospitalization and recommendation for OBGYN follow-up.    [facility], MD [facility]

## 2021-08-20 NOTE — Patient Instructions (Signed)
It was great to see you!  Things we discussed at today's visit: - I have placed a referral to OBGYN [facility]). They will call you for an appointment. You can try to call them early next week if you haven't heard from anyone.  We are checking some labs. We will send you a MyChart message with the results or call if they are abnormal.   Take care and seek immediate care sooner if you develop any concerns.  Dr. [facility]

## 2021-08-21 ENCOUNTER — Encounter (INDEPENDENT_AMBULATORY_CARE_PROVIDER_SITE_OTHER): Payer: Self-pay

## 2021-08-21 LAB — COMPREHENSIVE METABOLIC PANEL
ALT: 149 IU/L — ABNORMAL HIGH (ref 0–32)
AST: 91 IU/L — ABNORMAL HIGH (ref 0–40)
Albumin/Globulin Ratio: 1.7 (ref 1.2–2.2)
Albumin: 4.3 g/dL (ref 3.9–4.9)
Alkaline Phosphatase: 58 IU/L (ref 44–121)
BUN/Creatinine Ratio: 13 (ref 9–23)
BUN: 11 mg/dL (ref 6–20)
Bilirubin Total: 0.7 mg/dL (ref 0.0–1.2)
CO2: 22 mmol/L (ref 20–29)
Calcium: 9.5 mg/dL (ref 8.7–10.2)
Chloride: 102 mmol/L (ref 96–106)
Creatinine, Ser: 0.82 mg/dL (ref 0.57–1.00)
Globulin, Total: 2.5 g/dL (ref 1.5–4.5)
Glucose: 115 mg/dL — ABNORMAL HIGH (ref 70–99)
Potassium: 3.9 mmol/L (ref 3.5–5.2)
Sodium: 139 mmol/L (ref 134–144)
Total Protein: 6.8 g/dL (ref 6.0–8.5)
eGFR: 94 mL/min/{1.73_m2} (ref >59)

## 2021-08-21 LAB — IMMATURE CELLS
MYELOCYTES: 1 % — ABNORMAL HIGH (ref 0–0)
Metamyelocytes: 1 % — ABNORMAL HIGH (ref 0–0)

## 2021-08-21 LAB — CBC WITH DIFFERENTIAL/PLATELET
Basophils Absolute: 0 10*3/uL (ref 0.0–0.2)
Basos: 0 %
EOS (ABSOLUTE): 0.1 10*3/uL (ref 0.0–0.4)
Eos: 1 %
Hematocrit: 38.5 % (ref 34.0–46.6)
Hemoglobin: 12.7 g/dL (ref 11.1–15.9)
Lymphocytes Absolute: 3.7 10*3/uL — ABNORMAL HIGH (ref 0.7–3.1)
Lymphs: 54 %
MCH: 27.5 pg (ref 26.6–33.0)
MCHC: 33 g/dL (ref 31.5–35.7)
MCV: 84 fL (ref 79–97)
Monocytes Absolute: 0.5 10*3/uL (ref 0.1–0.9)
Monocytes: 8 %
Neutrophils Absolute: 2.4 10*3/uL (ref 1.4–7.0)
Neutrophils: 35 %
Platelets: 243 10*3/uL (ref 150–450)
RBC: 4.61 x10E6/uL (ref 3.77–5.28)
RDW: 14.2 % (ref 11.7–15.4)
WBC: 6.8 10*3/uL (ref 3.4–10.8)

## 2021-08-21 LAB — MAGNESIUM: Magnesium: 1.8 mg/dL (ref 1.6–2.3)

## 2021-08-22 ENCOUNTER — Telehealth: Payer: Self-pay | Admitting: Family Medicine

## 2021-08-22 NOTE — Telephone Encounter (Signed)
Called patient to discuss lab results. Electrolyte abnormalities have resolved, but LFTs are elevated and myelocytes/metamyelocytes noted on CBC with diff.  Discussed that both these findings can be seen in acute infection, but should resolve. Will need repeat CMP and CBC with Diff as well as peripheral smear at follow up.  Already has appointment to see PCP, Dr. Robyne Peers, next week.  Maury Dus, MD PGY-3, [facility]

## 2021-08-30 ENCOUNTER — Ambulatory Visit: Payer: 59 | Admitting: Family Medicine

## 2021-08-30 ENCOUNTER — Ambulatory Visit (INDEPENDENT_AMBULATORY_CARE_PROVIDER_SITE_OTHER): Payer: 59 | Admitting: Family Medicine

## 2021-08-30 ENCOUNTER — Encounter: Payer: Self-pay | Admitting: Family Medicine

## 2021-08-30 VITALS — BP 116/80 | HR 76 | Ht 69.0 in | Wt 253.1 lb

## 2021-08-30 DIAGNOSIS — R1030 Lower abdominal pain, unspecified: Secondary | ICD-10-CM | POA: Diagnosis not present

## 2021-08-30 DIAGNOSIS — A039 Shigellosis, unspecified: Secondary | ICD-10-CM | POA: Diagnosis not present

## 2021-08-30 NOTE — Patient Instructions (Addendum)
It was great seeing you today!  Today we discussed your labs and your recent hospitalization. Your lab work last visit had some slight abnormalities which we want to make sure resolve, these can be common since you are recovering from an infection. I will let you know of any abnormal results.   I have placed another OBGYN referral so that you can be seen sooner. Please make sure to follow up with them.  Please follow up at your next scheduled appointment, if anything arises between now and then, please don't hesitate to contact our office.   Thank you for allowing [place] to be a part of your medical care!  Thank you, Dr. Robyne Peers

## 2021-08-30 NOTE — Assessment & Plan Note (Signed)
-  resolving, reassurance provided to patient as she is still in the recovery phase of her acute infection. Recommended to maintain adequate hydration.  -prior lab work reviewed with patient, electrolytes wnl but elevated LFTs. Repeating CMP, CBC w/diff and peripheral smear to ensure that this is all wnl. These labs will be performed next week as no lab available today. Patient agrees to come in early next week to get these done

## 2021-08-30 NOTE — Progress Notes (Signed)
    SUBJECTIVE:   CHIEF COMPLAINT / HPI:   Patient presents for a follow up, had a recent hospitalization for sepsis secondary to gastroenteritis. GI pathogen panel was notably positive for Shigella. Completed doxycycline course. Also concern for PID during admission, instructed to follow up with OBGYN which she is awaiting to be seen with. She spoke with the OBGYN office near her home and they are not able to see her until October, she would like another referral to be placed so that she can be seen sooner. She is fine with any location.Today she still feels tired and sick, still have not completely recovered. Says that she has not been completely back to herself since returning to the hospital. Denies vaginal discharge or spotting. Sometimes endorses some cramping. LMP January, have been irregular since nexplanon placement.   OBJECTIVE:   BP 116/80   Pulse 76   Ht 5\' 9"  (1.753 m)   Wt 253 lb 2 oz (114.8 kg)   SpO2 100%   BMI 37.38 kg/m   General: Patient well-appearing, in no acute distress. CV: RRR, no murmurs or gallops auscultated  Resp: CTAB, no wheezing, rales or rhonchi noted Ext: no LE edema noted bilaterally  ASSESSMENT/PLAN:   Shigella gastroenteritis -resolving, reassurance provided to patient as she is still in the recovery phase of her acute infection. Recommended to maintain adequate hydration.  -prior lab work reviewed with patient, electrolytes wnl but elevated LFTs. Repeating CMP, CBC w/diff and peripheral smear to ensure that this is all wnl. These labs will be performed next week as no lab available today. Patient agrees to come in early next week to get these done   Concern for PID -completed course of doxycycline, still having very mild abdominal pain. Unclear if this is from multiple episodes of vomiting and diarrhea or GU related -OB/GYN referral placed again, patient contacted the clinic closer to her home who is unable to see her until October so she is open to  being seen at any location therefore referral placed today as well    November, DO [facility]

## 2021-09-02 ENCOUNTER — Other Ambulatory Visit: Payer: 59

## 2021-09-02 DIAGNOSIS — A039 Shigellosis, unspecified: Secondary | ICD-10-CM

## 2021-09-03 LAB — CBC WITH DIFFERENTIAL/PLATELET
Basophils Absolute: 0.1 10*3/uL (ref 0.0–0.2)
Basos: 1 %
EOS (ABSOLUTE): 0.1 10*3/uL (ref 0.0–0.4)
Eos: 2 %
Hematocrit: 40.9 % (ref 34.0–46.6)
Hemoglobin: 13.4 g/dL (ref 11.1–15.9)
Immature Grans (Abs): 0 10*3/uL (ref 0.0–0.1)
Immature Granulocytes: 0 %
Lymphocytes Absolute: 2 10*3/uL (ref 0.7–3.1)
Lymphs: 42 %
MCH: 28.1 pg (ref 26.6–33.0)
MCHC: 32.8 g/dL (ref 31.5–35.7)
MCV: 86 fL (ref 79–97)
Monocytes Absolute: 0.4 10*3/uL (ref 0.1–0.9)
Monocytes: 8 %
Neutrophils Absolute: 2.2 10*3/uL (ref 1.4–7.0)
Neutrophils: 47 %
Platelets: 287 10*3/uL (ref 150–450)
RBC: 4.77 x10E6/uL (ref 3.77–5.28)
RDW: 14.5 % (ref 11.7–15.4)
WBC: 4.6 10*3/uL (ref 3.4–10.8)

## 2021-09-03 LAB — COMPREHENSIVE METABOLIC PANEL
ALT: 25 IU/L (ref 0–32)
AST: 24 IU/L (ref 0–40)
Albumin/Globulin Ratio: 1.8 (ref 1.2–2.2)
Albumin: 4.6 g/dL (ref 3.9–4.9)
Alkaline Phosphatase: 55 IU/L (ref 44–121)
BUN/Creatinine Ratio: 16 (ref 9–23)
BUN: 13 mg/dL (ref 6–20)
Bilirubin Total: 0.9 mg/dL (ref 0.0–1.2)
CO2: 21 mmol/L (ref 20–29)
Calcium: 10.3 mg/dL — ABNORMAL HIGH (ref 8.7–10.2)
Chloride: 105 mmol/L (ref 96–106)
Creatinine, Ser: 0.79 mg/dL (ref 0.57–1.00)
Globulin, Total: 2.6 g/dL (ref 1.5–4.5)
Glucose: 100 mg/dL — ABNORMAL HIGH (ref 70–99)
Potassium: 4.3 mmol/L (ref 3.5–5.2)
Sodium: 141 mmol/L (ref 134–144)
Total Protein: 7.2 g/dL (ref 6.0–8.5)
eGFR: 99 mL/min/{1.73_m2} (ref >59)

## 2021-09-04 LAB — PATHOLOGIST SMEAR REVIEW
Basophils Absolute: 0 10*3/uL (ref 0.0–0.2)
Basos: 1 %
EOS (ABSOLUTE): 0.1 10*3/uL (ref 0.0–0.4)
Eos: 2 %
Hematocrit: 39.8 % (ref 34.0–46.6)
Hemoglobin: 13.3 g/dL (ref 11.1–15.9)
Immature Grans (Abs): 0 10*3/uL (ref 0.0–0.1)
Immature Granulocytes: 0 %
Lymphocytes Absolute: 2.2 10*3/uL (ref 0.7–3.1)
Lymphs: 43 %
MCH: 28.3 pg (ref 26.6–33.0)
MCHC: 33.4 g/dL (ref 31.5–35.7)
MCV: 85 fL (ref 79–97)
Monocytes Absolute: 0.4 10*3/uL (ref 0.1–0.9)
Monocytes: 7 %
Neutrophils Absolute: 2.3 10*3/uL (ref 1.4–7.0)
Neutrophils: 47 %
Path Rev PLTs: NORMAL
Path Rev RBC: NORMAL
Path Rev WBC: NORMAL
Platelets: 280 10*3/uL (ref 150–450)
RBC: 4.7 x10E6/uL (ref 3.77–5.28)
RDW: 14.6 % (ref 11.7–15.4)
WBC: 5 10*3/uL (ref 3.4–10.8)

## 2021-10-25 ENCOUNTER — Ambulatory Visit (INDEPENDENT_AMBULATORY_CARE_PROVIDER_SITE_OTHER): Payer: PRIVATE HEALTH INSURANCE | Admitting: Family Medicine

## 2021-10-25 VITALS — BP 132/86 | HR 65 | Temp 98.8°F | Ht 69.0 in | Wt 257.4 lb

## 2021-10-25 DIAGNOSIS — J45909 Unspecified asthma, uncomplicated: Secondary | ICD-10-CM | POA: Diagnosis not present

## 2021-10-25 DIAGNOSIS — M7989 Other specified soft tissue disorders: Secondary | ICD-10-CM | POA: Diagnosis not present

## 2021-10-25 DIAGNOSIS — N644 Mastodynia: Secondary | ICD-10-CM

## 2021-10-25 MED ORDER — ALBUTEROL SULFATE HFA 108 (90 BASE) MCG/ACT IN AERS: 2.0000 | INHALATION_SPRAY | Freq: Four times a day (QID) | RESPIRATORY_TRACT | 2 refills | Status: DC | PRN

## 2021-10-25 NOTE — Patient Instructions (Addendum)
It was wonderful to see you today. Thank you for allowing me to be a part of your care. Below is a short summary of what we discussed at your visit today:  Breast pain I have ordered a mammogram of the left breast. This will be at the [facility]. You will call them directly to make an appointment at your convenience. Information below. Other things that can help breast pain (mastalgia) are increased water intake, reduced caffeine intake, and a proper fitting supportive bra. Please see handout for more.   Keep a record of when the pain happens and note if it is around a menstrual cycle or related to caffeine consumption.        Health Maintenance We like to think about ways to keep you healthy for years to come. Below are some interventions and screenings we can offer to keep you healthy: - flu vaccine (can be here at family med clinic) - COVID vaccine (at your pharmacy) - PAP smear (next due February 2024)   Please bring all of your medications to every appointment!  If you have any questions or concerns, please do not hesitate to contact us via phone or MyChart message.   Ezequiel Essex, MD

## 2021-10-25 NOTE — Assessment & Plan Note (Signed)
Given palpable nodule at 2:00 on right breast, will obtain diagnostic mammogram.  Counseled patient on well-fitting bra, decrease caffeine, increase water intake.  Encouraged her to keep diary of symptoms and associated factors.  Return precautions given.

## 2021-10-25 NOTE — Progress Notes (Cosign Needed Addendum)
    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  Breast pain Started in August after she came home from her admission at [facility]. Intermittent, lasting very short durations of 1 to 5 minutes.   Mostly left breast, sometimes right. No aggravating or relieving factors. Has not tried any OTC medications, does not last long enough.  Denies breast masses, lesions, or nipple discharge. Questionable right axillary lymph node.  Current contraception is Nexplanon - placed 2022 Has 3 children, breast fed all without issue, stopped most recently in 2020  Family history of breast cancer in maternal great aunt and second cousin.  No breast cancer in immediate family.  No known uterine or ovarian cancers in the family.  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  She reports these have grown in size gradually.  Now uncomfortable when sitting, and tender to touch.  No erythema or warmth over sites.  Did have abscess over left posterior hip that was drained, but the lipomas formed afterwards.  PERTINENT  PMH / PSH: HTN, migraine, bilateral hearing loss, BPPV, palindromic rheumatism, PID, sickle cell trait, asthma, recent Shigella gastroenteritis+PID+Sepsis (admission 8/2-08/17/21 [facility])  OBJECTIVE:   BP 132/86   Pulse 65   Temp 98.8 F (37.1 C) (Oral)   Ht 5\' 9"  (1.753 m)   Wt 257 lb 6.4 oz (116.8 kg)   SpO2 100%   BMI 38.01 kg/m    General: Awake, alert, oriented, no acute distress Bilateral breasts: Inspection reveals they appear equal bilaterally No gross swelling or deformity of either breast Bilateral nipples and areolas appear normal Skin overlying is unremarkable and without erythema, drainage, or swelling Right breast: Patient points with 1 finger to 2 o'clock position with complaint of tenderness, nodule  palpated at that location Right axilla without palpable lymph nodes Left breast: Normal ductal structures palpated No discrete lesions or nodules palpated Left axilla without palpable lymph nodes Skin / posterior torso: Soft smooth enlargements underlying skin bilaterally at level of iliac crests Does not cross midline No discrete palpable mass Bilateral masses are tender to palpation No warmth erythema overlying, no drainage Linear scar noted over left posterior mass  ASSESSMENT/PLAN:   Soft tissue mass Gradually worsening.  Will order soft tissue ultrasound.  Referral to general surgery, who may be able to perform resection if lipoma.  Mastalgia Given palpable nodule at 2:00 on right breast, will obtain diagnostic mammogram.  Counseled patient on well-fitting bra, decrease caffeine, increase water intake.  Encouraged her to keep diary of symptoms and associated factors.  Return precautions given.     Ezequiel Essex, MD [place]

## 2021-10-25 NOTE — Assessment & Plan Note (Signed)
Gradually worsening.  Will order soft tissue ultrasound.  Referral to general surgery, who may be able to perform resection if lipoma.

## 2021-10-28 ENCOUNTER — Encounter: Payer: Self-pay | Admitting: Family Medicine

## 2021-10-28 NOTE — Addendum Note (Signed)
Addended by: Renard Hamper on: 10/28/2021 05:41 PM   Modules accepted: Orders

## 2021-10-29 NOTE — Telephone Encounter (Signed)
-----   Message from Valerie Roys, [place] sent at 10/27/2021  6:31 PM EDT ----- Regarding: RE: Need to call and schedule [place]? Patient can call [facility] to make the appt at her convenience [phone].  Jazmin Hartsell,CMA  ----- Message ----- From: Ezequiel Essex, MD Sent: 10/25/2021   6:16 PM EDT To: Renne Crigler Pool Subject: Need to call and schedule [place]?                  Green pool,   Just ordered a soft tissue ultrasound for this patient.  She has complaint of soft tissue masses of her lower back.  I do not think I ordered the right thing, but could not find a better order.  Ordered it to [facility].  Do you have to schedule this or can she call directly because it is [facility] imaging?  Thanks,  [employer]

## 2021-10-30 ENCOUNTER — Other Ambulatory Visit: Payer: Self-pay | Admitting: Family Medicine

## 2021-10-30 DIAGNOSIS — N644 Mastodynia: Secondary | ICD-10-CM

## 2021-10-31 ENCOUNTER — Other Ambulatory Visit: Payer: PRIVATE HEALTH INSURANCE

## 2021-11-05 ENCOUNTER — Other Ambulatory Visit: Payer: PRIVATE HEALTH INSURANCE

## 2021-11-06 ENCOUNTER — Ambulatory Visit
Admission: RE | Admit: 2021-11-06 | Discharge: 2021-11-06 | Disposition: A | Discharge status: Home / Self Care | Payer: PRIVATE HEALTH INSURANCE | Source: Ambulatory Visit | Attending: Family Medicine | Admitting: Family Medicine

## 2021-11-06 ENCOUNTER — Ambulatory Visit (INDEPENDENT_AMBULATORY_CARE_PROVIDER_SITE_OTHER): Payer: PRIVATE HEALTH INSURANCE | Admitting: Family Medicine

## 2021-11-06 ENCOUNTER — Encounter: Payer: Self-pay | Admitting: Family Medicine

## 2021-11-06 VITALS — BP 119/85 | HR 74 | Ht 69.0 in | Wt 256.0 lb

## 2021-11-06 DIAGNOSIS — I10 Essential (primary) hypertension: Secondary | ICD-10-CM | POA: Diagnosis not present

## 2021-11-06 DIAGNOSIS — Z23 Encounter for immunization: Secondary | ICD-10-CM | POA: Diagnosis not present

## 2021-11-06 DIAGNOSIS — M7989 Other specified soft tissue disorders: Secondary | ICD-10-CM

## 2021-11-06 DIAGNOSIS — G44209 Tension-type headache, unspecified, not intractable: Secondary | ICD-10-CM

## 2021-11-06 DIAGNOSIS — R4586 Emotional lability: Secondary | ICD-10-CM | POA: Insufficient documentation

## 2021-11-06 MED ORDER — LOSARTAN POTASSIUM 25 MG PO TABS: 25.0000 mg | ORAL_TABLET | Freq: Every day | ORAL | 3 refills | Status: DC

## 2021-11-06 NOTE — Assessment & Plan Note (Signed)
-  PHQ-9 score of 19 with negative question 9 reviewed and discussed. -list of therapists provided, encouraged to establish care at her earliest convenience -follow up in 1-2 weeks, consider initiating SSRI at that time.

## 2021-11-06 NOTE — Assessment & Plan Note (Signed)
-  BP 119/85, at goal -discontinue amlodipine given edema and headaches -started losartan -instructed to maintain BP log and to bring to next visit  -follow up in 1-2 weeks for BMP to assess renal function and electrolytes

## 2021-11-06 NOTE — Progress Notes (Signed)
    SUBJECTIVE:   CHIEF COMPLAINT / HPI:   Patient presents with occasional hand and feet swelling sometimes when she gets up in the mornings. This has been ongoing for 3 months. Because of this, she has not been taking amlodipine for a few days. Checks BP occasionally at home, usually around 616-073X systolic and 10-62I diastolic. Since she is not taking her medication for a few days, she says that her systolic BP can go up to 948N sometimes even higher. Denies chest pain and dyspnea. Also getting headaches with occasional blurry vision like she feels like she needs a new prescription. Has not seen the ophthalmologist in 3 years, scheduled for Nov 7th.   Mood changes Feels like she is having trouble focusing and tired. She recently stopped school to take care of her kids and now she will plan to go back but feels her mood is down. She is not currently seeing a therapist, but she is open to doing so.   OBJECTIVE:   BP 119/85   Pulse 74   Ht 5\' 9"  (1.753 m)   Wt 256 lb (116.1 kg)   SpO2 100%   BMI 37.80 kg/m   General: Patient well-appearing, in no acute distress. CV: RRR, no murmurs or gallops auscultated Resp: CTAB, no wheezing, rales or rhonchi noted Abdomen: soft, nontender, presence of bowel sounds Neuro: 2-12 CN grossly intact, 5/5 UE and LE strength bilaterally, gross sensation intact Psych: appears tired and overwhelmed otherwise mood appropriate, pleasant   ASSESSMENT/PLAN:   Essential hypertension -BP 119/85, at goal -discontinue amlodipine given edema and headaches -started losartan -instructed to maintain BP log and to bring to next visit  -follow up in 1-2 weeks for BMP to assess renal function and electrolytes   Tension headache -symptoms seem most consistent with tension-type headache, no red flag symptoms -encouraged to see ophthalmologist as she may need a new prescription, scheduled for Nov 7 -limit screen time and other triggers of eye strain -switched  amlodipine to losartan   Mood changes -PHQ-9 score of 19 with negative question 9 reviewed and discussed. -list of therapists provided, encouraged to establish care at her earliest convenience -follow up in 1-2 weeks, consider initiating SSRI at that time.      Donney Dice, [place]

## 2021-11-06 NOTE — Patient Instructions (Addendum)
It was great seeing you today!  Today we discussed your blood pressure, I have changed your medication. Please stop taking amlodipine, instead I have prescribed losartan. You may pick this up at your pharmacy. Please record your blood pressures and bring this to your next visit.   Below are a list of therapists, please find one to establish care with.   Please follow up at your next scheduled appointment in 1-2 weeks, if anything arises between now and then, please don't hesitate to contact our office.   Thank you for allowing [place] to be a part of your medical care!  Thank you, Dr. Robyne Peers  Also a reminder of our clinic's no-show policy. Please make sure to arrive at least 15 minutes prior to your scheduled appointment time. Please try to cancel before 24 hours if you are not able to make it. If you no-show for 2 appointments then you will be receiving a warning letter. If you no-show after 3 visits, then you may be at risk of being dismissed from our clinic. This is to ensure that everyone is able to be seen in a timely manner. Thank you, we appreciate your assistance with this!    Therapy and Counseling Resources Most providers on this list will take Medicaid. Patients with commercial insurance or Medicare should contact their insurance company to get a list of in network providers.  [employer] (takes children) Location 1: [address] [place], [place] [address] Location 2: [address] [place], [place] [address] [phone]   [facility] (spanish speaking therapist available)(habla espanol)(take medicare and medicaid)  [address], [place], [place] [address], [place] [email or web address] [phone]  [facility]:Psychiatry and Counseling [address] [place], [place] [address] [phone]  [facility]   [address], Suite [place], [place] [address]      [phone]  [facility] (spanish available) [address]  [place], [place] [address] [phone]  [facility] (take [facility] and medicare) [address], [address]  [place], [place] [address]       [phone]     [facility] (virtual only) [phone]  Jovita Kussmaul [facility] [address] [place], [place] [phone]  [facility]:  [address] [place] [phone]  [facility]:  [address] Hessie Diener [phone]  [facility] (under & uninsured) [address]   [place] [phone]    [email or web address] [phone]  Ginette Otto    [phone]  [facility] -[address]     Phone:  [phone]     [facility]-  [address]  [phone]   [facility] [address]      [place], [place] [phone]  [facility]: [address] [place], [place], [place]  [phone]   [facility] (Spanish therapist) [email or web address]  [address]   [place] [address]    [phone]    [facility]   [address],  [place], [place]  [phone]   [facility]  [address] [place]  [phone]  [facility]  [address] [place], [place]        [phone]  Open Access/Walk In Clinic under & uninsured  [facility]  [address]  [place], [place] Crisis [phone]  Family Service of the [place],  ([place])   [place], [place]: [phone]) 8:30 - 12; 1 - 2:30  Family Service of the [place],  [place], [place]    [phone]):8:30 - 12; 2 - 3PM  [facility],  [address],  [place]; [phone]):   Mon - Fri 8 AM - 5 PM  [facility] [place]  MWF  12:30 to 3:00 or call to schedule an appointment  [phone]  Specific Provider options Psychology Today  [email or web address] click on find a therapist  enter your zip code left side and select or tailor a therapist for your specific need.   [facility] Provider Directory [email or web address]  (Medicaid)   Follow all drop down to find a provider  [place] or [email or web address] [address], [place], [place] Recovery support and educational   24- Hour Availability:   [facility]  [address] [place], [place] Crisis [phone]  Family Service of the [employer] [phone]  [place]  [phone]   [place]  [phone] (after hours)  Therapeutic Alternative/Mobile Crisis   [phone]  [place] National Suicide Hotline  [phone] Diamantina Monks)  Call 911 or go to emergency room  [facility]  [phone]);  [place] and [employer]  [phone]); [place], [place], [place], [place], [place], [place], [place]

## 2021-11-06 NOTE — Assessment & Plan Note (Signed)
-  symptoms seem most consistent with tension-type headache, no red flag symptoms -encouraged to see ophthalmologist as she may need a new prescription, scheduled for Nov 7 -limit screen time and other triggers of eye strain -switched amlodipine to losartan

## 2021-11-18 ENCOUNTER — Other Ambulatory Visit: Payer: Self-pay | Admitting: Surgery

## 2021-11-18 ENCOUNTER — Other Ambulatory Visit (HOSPITAL_COMMUNITY)
Admission: RE | Admit: 2021-11-18 | Discharge: 2021-11-18 | Disposition: A | Discharge status: Home / Self Care | Payer: 59 | Source: Ambulatory Visit | Attending: Family Medicine | Admitting: Family Medicine

## 2021-11-18 ENCOUNTER — Ambulatory Visit (INDEPENDENT_AMBULATORY_CARE_PROVIDER_SITE_OTHER): Payer: 59 | Admitting: Family Medicine

## 2021-11-18 ENCOUNTER — Encounter: Payer: Self-pay | Admitting: Family Medicine

## 2021-11-18 VITALS — BP 131/95 | HR 67 | Wt 255.0 lb

## 2021-11-18 DIAGNOSIS — N898 Other specified noninflammatory disorders of vagina: Secondary | ICD-10-CM | POA: Diagnosis not present

## 2021-11-18 DIAGNOSIS — R45851 Suicidal ideations: Secondary | ICD-10-CM | POA: Diagnosis not present

## 2021-11-18 DIAGNOSIS — I10 Essential (primary) hypertension: Secondary | ICD-10-CM

## 2021-11-18 LAB — POCT WET PREP (WET MOUNT)
Clue Cells Wet Prep Whiff POC: NEGATIVE
Trichomonas Wet Prep HPF POC: ABSENT

## 2021-11-18 NOTE — Progress Notes (Unsigned)
    SUBJECTIVE:   CHIEF COMPLAINT / HPI:   Patient presents with vaginal discharge for 2 years, intermittently. Denies fever, chills, pelvic pain, dysuria or other symptoms. Only sexually active with her husband. She has nexplanon for contraception.   Patient with history of hypertension, recently switched to losartan due to headaches. She is not getting any more headaches. Says that it makes her sleepy so she takes it at night. Denies chest pain, dyspnea and leg swelling.   OBJECTIVE:   BP (!) 131/95   Pulse 67   Wt 255 lb (115.7 kg)   SpO2 100%   BMI 37.66 kg/m   General: Patient well-appearing, in no acute distress. CV: RRR, no murmurs or gallops auscultated Resp: CTAB, no wheezing, rales or rhonchi noted GU: presence of cervical discharge otherwise normal cervix and vagina, no external lesions noted along labia, no associated odor or polyps noted Ext: no LE edema noted bilaterally  Psych: endorsing passive SI without plan or prior attempt, mood otherwise appropriate and smiling   GU exam performed in the presence of chaperone, [employer], CMA.   ASSESSMENT/PLAN:   Vaginal discharge -wet prep performed, positive for likely candida vaginitis and still awaiting GC/Chlamydia  -HIV and RPR negative -prescribed fluconazole 150 mg single dose, discussed result with patient   Essential hypertension -repeat BP 130/83, at goal -continue losartan as patient tolerating well -pending BMP to evaluate electrolytes and renal function   Suicidal ideation -PHQ-9 score of 18 with 1 for question 9 reviewed and discussed extensively. -Patient says that she is tired and just feels very drained out. She confesses that she has felt like this intermittently for years and just finally decided to actually tell me. She denies ever having a plan and denies prior attempt. Patient reports that she would never ever do anything to harm herself because of her children. She says that they are just  thoughts that come and go, has never acted on them and does not plan too. No active SI which is reassuring -discussed resources including suicide hotline provided -received therapy resources at the last visit but provided again at this visit, she was strongly encouraged to establish care at her earliest convenience -reassurance provided that I am also her support system should she need anything  -follow up in 1 month as patient states she does not want follow up sooner, she will let me know should she need anything sooner    Donney Dice, [place]

## 2021-11-18 NOTE — Patient Instructions (Signed)
It was great seeing you today!  Today we discussed your vaginal discharge, we have done testing. I will let you know of any abnormal results.   Please continue your losartan for your blood pressure, we will get blood work to also check electrolytes and your kidney function.   Please call [phone] if you have these thoughts. Below is a list of therapists, please establish with someone at your earliest convenience.   Please follow up at your next scheduled appointment in 1 month, if anything arises between now and then, please don't hesitate to contact our office.   Thank you for allowing [place] to be a part of your medical care!  Thank you, Dr. Larae Grooms  Also a reminder of our clinic's no-show policy. Please make sure to arrive at least 15 minutes prior to your scheduled appointment time. Please try to cancel before 24 hours if you are not able to make it. If you no-show for 2 appointments then you will be receiving a warning letter. If you no-show after 3 visits, then you may be at risk of being dismissed from our clinic. This is to ensure that everyone is able to be seen in a timely manner. Thank you, we appreciate your assistance with this!    Therapy and Counseling Resources Most providers on this list will take Medicaid. Patients with commercial insurance or Medicare should contact their insurance company to get a list of in network providers.  [employer] (takes children) Location 1: [address] [place], [place] [address] Location 2: [place], [place] [address] [place] ([place] speaking therapist available)(habla espanol)(take medicare and medicaid)  [place], [place], [place] [address], [place] [email or web address] [phone]  [facility]:Psychiatry and Counseling [address] [place], [place] [address] [place], [place], [place] [address]      [phone]  [place] (spanish available) [place], [place] [address] [facility] (take [facility] and medicare) [address], [place], [place] [address]       [phone]     [place] (virtual only) [phone]  Jinny Blossom [facility] [address], [place], [place]  [facility]:  [place]. [place] [phone]  [facility]:  [place] [address]ate [phone]  [facility] (under & uninsured) [address], [place] [phone]    [email or web address]    [place] [address]  [place]    [phone]  [facility] [place]     Phone:  [phone]     [facility] [place]  [place] #[address]      [place], [place] ext [place]: [place], [place], [place]   [place] ([place] therapist) [email or web address]  [address]   [place] [address]    [phone]    [facility]   [address],  [place], [place]   [place]  [place]  [facility]  [address] [place], [place]        [phone]  Open Access/Walk In  Clinic under & uninsured  [facility]  [place], [place]  Family Service of the [place],  ([place])   [place], [place]: [phone]) 8:30 - 12; 1 - 2:30  Family Service of the [place],  [place], [place]    ([phone]):8:30 - 12; 2 - 3PM  [facility],  [address]  [place]; [phone]):   Mon - Fri 8 AM - 5  PM  [facility] [place]  MWF 12:30 to 3:00 or call to schedule an appointment  [phone]  Specific Provider options Psychology Today  [email or web address]/us click on find a therapist  enter your zip code left side and select or tailor a therapist for your specific need.   [facility] Provider Directory [email or web address]  (Medicaid)   Follow all drop down to find a provider  [place] or [email or web address] [address], [place], [place] Recovery support and educational   24- Hour Availability:   [facility]  [address] [place], [place] Crisis [phone]  Family Service of the [employer] [phone]  [place]  [phone]   [place]  [phone] (after hours)  Therapeutic Alternative/Mobile Crisis   [phone]  [place] National Suicide Hotline  [phone] Diamantina Monks)  Call 911 or go to emergency room  [facility]  [phone]);  [place] and [employer]  [phone]); [place], [place], [place], [place], [place], [place], [place]       If you are feeling suicidal or depression symptoms worsen please immediately go to:   If you are thinking about harming yourself or having thoughts of suicide, or if you know someone who is, seek help right away. If you are in crisis, make sure you are not left alone.  If someone else is in crisis, make sure he/she/they is not left alone  Call 988 OR 1-800-273-TALK  24 Hour Availability for [place]  [address] [place], [place] Crisis [phone]    Other crisis resources:  Family Service of the [employer] (Domestic Violence, Rape & Victim  Assistance [phone]  [facility] [place]    (ONLY from 8am-4pm)    [phone]  Therapeutic Alternative Mobile Crisis Unit (24/7)   [phone]  [place] National Suicide Hotline   [phone] Diamantina Monks)

## 2021-11-19 ENCOUNTER — Other Ambulatory Visit: Payer: Self-pay | Admitting: Family Medicine

## 2021-11-19 DIAGNOSIS — N898 Other specified noninflammatory disorders of vagina: Secondary | ICD-10-CM | POA: Insufficient documentation

## 2021-11-19 DIAGNOSIS — R45851 Suicidal ideations: Secondary | ICD-10-CM | POA: Insufficient documentation

## 2021-11-19 MED ORDER — FLUCONAZOLE 150 MG PO TABS: 150.0000 mg | ORAL_TABLET | Freq: Once | ORAL | 0 refills | Status: AC

## 2021-11-19 NOTE — Assessment & Plan Note (Signed)
-  wet prep performed, positive for likely candida vaginitis and still awaiting GC/Chlamydia  -HIV and RPR negative -prescribed fluconazole 150 mg single dose, discussed result with patient

## 2021-11-19 NOTE — Assessment & Plan Note (Signed)
-  repeat BP 130/83, at goal -continue losartan as patient tolerating well -pending BMP to evaluate electrolytes and renal function

## 2021-11-19 NOTE — Assessment & Plan Note (Signed)
-  PHQ-9 score of 18 with 1 for question 9 reviewed and discussed extensively. -Patient says that she is tired and just feels very drained out. She confesses that she has felt like this intermittently for years and just finally decided to actually tell me. She denies ever having a plan and denies prior attempt. Patient reports that she would never ever do anything to harm herself because of her children. She says that they are just thoughts that come and go, has never acted on them and does not plan too. No active SI which is reassuring -discussed resources including suicide hotline provided -received therapy resources at the last visit but provided again at this visit, she was strongly encouraged to establish care at her earliest convenience -reassurance provided that I am also her support system should she need anything  -follow up in 1 month as patient states she does not want follow up sooner, she will let me know should she need anything sooner

## 2021-11-20 LAB — HIV ANTIBODY (ROUTINE TESTING W REFLEX): HIV Screen 4th Generation wRfx: NONREACTIVE

## 2021-11-20 LAB — BASIC METABOLIC PANEL
BUN/Creatinine Ratio: 12 (ref 9–23)
BUN: 10 mg/dL (ref 6–20)
CO2: 19 mmol/L — ABNORMAL LOW (ref 20–29)
Calcium: 9.4 mg/dL (ref 8.7–10.2)
Chloride: 104 mmol/L (ref 96–106)
Creatinine, Ser: 0.85 mg/dL (ref 0.57–1.00)
Glucose: 83 mg/dL (ref 70–99)
Potassium: 4 mmol/L (ref 3.5–5.2)
Sodium: 142 mmol/L (ref 134–144)
eGFR: 90 mL/min/{1.73_m2} (ref >59)

## 2021-11-20 LAB — CERVICOVAGINAL ANCILLARY ONLY
Chlamydia: NEGATIVE
Comment: NEGATIVE
Comment: NEGATIVE
Comment: NORMAL
Neisseria Gonorrhea: NEGATIVE
Trichomonas: NEGATIVE

## 2021-11-20 LAB — RPR: RPR Ser Ql: NONREACTIVE

## 2021-11-28 ENCOUNTER — Telehealth: Payer: Self-pay | Admitting: Family Medicine

## 2021-11-28 NOTE — Telephone Encounter (Signed)
patient dropped off form at front desk for Health Clearance Form.  Verified that patient section of form has been completed.  Last DOS/WCC with PCP was 11/18/21.  Placed form in blue team folder to be completed by clinical staff.  [name]

## 2021-11-28 NOTE — Telephone Encounter (Signed)
Clinical info completed on [facility] form.  Placed form in PCP's box for completion.    When form is completed, please route note to "RN Team" and place in wall pocket in front office.   Aquilla Solian, CMA

## 2021-11-29 NOTE — Telephone Encounter (Signed)
Patient calls nurse line checking on the status of form.   I advised patient of our policy, however she reports she needs this no later than Monday.   Will forward to PCP.

## 2021-12-03 NOTE — Telephone Encounter (Signed)
Form placed up front for pick up.   Copy made for batch scanning.   Patient aware.  

## 2021-12-11 ENCOUNTER — Ambulatory Visit
Admission: RE | Admit: 2021-12-11 | Discharge: 2021-12-11 | Disposition: A | Discharge status: Home / Self Care | Payer: 59 | Source: Ambulatory Visit | Attending: Family Medicine | Admitting: Family Medicine

## 2021-12-11 ENCOUNTER — Ambulatory Visit
Admission: RE | Admit: 2021-12-11 | Discharge: 2021-12-11 | Disposition: A | Discharge status: Home / Self Care | Payer: PRIVATE HEALTH INSURANCE | Source: Ambulatory Visit | Attending: Family Medicine | Admitting: Family Medicine

## 2021-12-11 DIAGNOSIS — N644 Mastodynia: Secondary | ICD-10-CM

## 2021-12-12 ENCOUNTER — Encounter: Payer: Self-pay | Admitting: Family Medicine

## 2021-12-24 ENCOUNTER — Ambulatory Visit (INDEPENDENT_AMBULATORY_CARE_PROVIDER_SITE_OTHER): Payer: 59 | Admitting: Obstetrics & Gynecology

## 2021-12-24 ENCOUNTER — Encounter: Payer: Self-pay | Admitting: Obstetrics & Gynecology

## 2021-12-24 VITALS — BP 152/91 | HR 71 | Ht 69.0 in | Wt 254.0 lb

## 2021-12-24 DIAGNOSIS — Z8742 Personal history of other diseases of the female genital tract: Secondary | ICD-10-CM

## 2021-12-24 DIAGNOSIS — Z975 Presence of (intrauterine) contraceptive device: Secondary | ICD-10-CM | POA: Insufficient documentation

## 2021-12-24 DIAGNOSIS — N760 Acute vaginitis: Secondary | ICD-10-CM | POA: Diagnosis not present

## 2021-12-24 MED ORDER — BORIC ACID CRYS: 600.0000 mg | CRYSTALS | Freq: Every day | 5 refills | Status: DC

## 2021-12-24 MED ORDER — FLUCONAZOLE 150 MG PO TABS: ORAL_TABLET | ORAL | 1 refills | Status: DC

## 2021-12-24 NOTE — Progress Notes (Signed)
GYNECOLOGY OFFICE VISIT NOTE  History:   Janet [name] is a 36 y.o. [phone] here today for follow up after admission for PID in 08/2021.  Ct scan showed possible hydrosalpinges, not seen on follow up scan. She was admitted and given antibiotics for a few days, then told to follow up. Since then, she reports intermittent pelvic pain, can go to 6/10 on a pain scale. Does not take anything for it. Wants ot make sure everything is okay. Also reports recurrent yeast vaginitis (many positive tests, last one in 11/2021), is practicing all tenets of proper vulvovaginal hygiene.  Currently asymptomatic, as she is on period.  Had Nexplanon placed in 05/17/2020.  She denies any abnormal vaginal discharge, bleeding, pelvic pain or other concerns.    Past Medical History:  Diagnosis Date   Abdominal pain 05/06/2021   Anemia    Asthma    Bilateral knee pain 10/22/2020   Depression    Finger pain, left 11/26/2017   Gestational diabetes mellitus, antepartum 08/12/2016   Moved pt to BRX GDM program Growth [place] at 38 week EFW 7#13oz, 82nd%   Left wrist pain 06/18/2020   Migraines    Right wrist pain 11/26/2017   Skin tag 05/11/2020    Past Surgical History:  Procedure Laterality Date   CESAREAN SECTION N/A 10/17/2016   Procedure: REPEAT CESAREAN SECTION;  Surgeon: Adam Phenix, MD;  [facility]: [facility];  Service: Obstetrics;  Laterality: N/A;   CESAREAN SECTION     DILATION AND CURETTAGE OF UTERUS      The following portions of the patient's history were reviewed and updated as appropriate: allergies, current medications, past family history, past medical history, past social history, past surgical history and problem list.   Health Maintenance:  Normal pap and negative HRHPV in 11/2021 at [facility] (report abstracted into chart)  Review of Systems:  Pertinent items noted in HPI and remainder of comprehensive ROS otherwise negative.  Physical Exam:  BP (!) 152/91   Pulse  71   Ht 5\' 9"  (1.753 m)   Wt 254 lb (115.2 kg)   BMI 37.51 kg/m  CONSTITUTIONAL: Well-developed, well-nourished female in no acute distress.  HEENT:  Normocephalic, atraumatic. External right and left ear normal. No scleral icterus.  NECK: Normal range of motion, supple, no masses noted on observation SKIN: No rash noted. Not diaphoretic. No erythema. No pallor. MUSCULOSKELETAL: Normal range of motion. No edema noted. NEUROLOGIC: Alert and oriented to person, place, and time. Normal muscle tone coordination. No cranial nerve deficit noted. PSYCHIATRIC: Normal mood and affect. Normal behavior. Normal judgment and thought content. CARDIOVASCULAR: Normal heart rate noted RESPIRATORY: Effort and breath sounds normal, no problems with respiration noted ABDOMEN: No masses noted. No other overt distention noted.  No current tenderness.  PELVIC: Deferred     Assessment and Plan:     1. Recurrent vaginitis Proper vulvar hygiene re-emphasized: discussed avoidance of perfumed soaps, detergents, lotions and any type of douches; in addition to wearing cotton underwear and no underwear at night.  Also recommended cleaning front to back, voiding and cleaning up after intercourse.  Will do treatment with prolonged fluconazole therapy and also boric acid vaginal therapy to help in re-establishing proper vaginal pH balance.  Will continue to monitor closely. - Boric Acid CRYS; Place 600 mg vaginally at bedtime. Use vaginally every night for two weeks then twice a week for six months  Dispense: 500 g; Refill: 5 - fluconazole (DIFLUCAN) 150 MG tablet;  Take one tablet by mouth every three days for three doses, then take one tablet once a week for six months  Dispense: 30 tablet; Refill: 1  2. History of pelvic inflammatory disease Will get another ultrasound.  Patient told that pain can linger in some patients with PID.  Also concerned about possible adhesive disease s/p her two cesarean sections. If ultrasound  is negative, may consider diagnostic laparoscopy. OTC pain medications recommended as needed. - US PELVIC COMPLETE WITH TRANSVAGINAL; Future  Routine preventative health maintenance measures emphasized. Please refer to After Visit Summary for other counseling recommendations.   Return for any gynecologic concerns.    I spent 30 minutes dedicated to the care of this patient including pre-visit review of records, face to face time with the patient discussing her conditions and treatments and post visit orders.    Verita Schneiders, MD, [place] for [employer], [place]

## 2021-12-30 ENCOUNTER — Ambulatory Visit
Admission: RE | Admit: 2021-12-30 | Discharge: 2021-12-30 | Disposition: A | Discharge status: Home / Self Care | Payer: Commercial Managed Care - HMO | Source: Ambulatory Visit | Attending: Obstetrics & Gynecology | Admitting: Obstetrics & Gynecology

## 2021-12-30 DIAGNOSIS — Z8742 Personal history of other diseases of the female genital tract: Secondary | ICD-10-CM | POA: Insufficient documentation

## 2022-03-14 DIAGNOSIS — Z419 Encounter for procedure for purposes other than remedying health state, unspecified: Secondary | ICD-10-CM | POA: Diagnosis not present

## 2022-03-17 ENCOUNTER — Ambulatory Visit: Payer: 59 | Admitting: Family Medicine

## 2022-04-09 ENCOUNTER — Ambulatory Visit (INDEPENDENT_AMBULATORY_CARE_PROVIDER_SITE_OTHER): Payer: Medicaid Other | Admitting: Family Medicine

## 2022-04-09 ENCOUNTER — Encounter: Payer: Self-pay | Admitting: Family Medicine

## 2022-04-09 VITALS — BP 118/100 | HR 62 | Temp 98.1°F | Ht 69.0 in | Wt 243.0 lb

## 2022-04-09 DIAGNOSIS — R0683 Snoring: Secondary | ICD-10-CM

## 2022-04-09 DIAGNOSIS — Z7689 Persons encountering health services in other specified circumstances: Secondary | ICD-10-CM | POA: Insufficient documentation

## 2022-04-09 DIAGNOSIS — I10 Essential (primary) hypertension: Secondary | ICD-10-CM

## 2022-04-09 DIAGNOSIS — J45909 Unspecified asthma, uncomplicated: Secondary | ICD-10-CM | POA: Diagnosis not present

## 2022-04-09 DIAGNOSIS — E669 Obesity, unspecified: Secondary | ICD-10-CM

## 2022-04-09 DIAGNOSIS — R5383 Other fatigue: Secondary | ICD-10-CM | POA: Diagnosis not present

## 2022-04-09 DIAGNOSIS — Z8632 Personal history of gestational diabetes: Secondary | ICD-10-CM | POA: Diagnosis not present

## 2022-04-09 MED ORDER — ALBUTEROL SULFATE HFA 108 (90 BASE) MCG/ACT IN AERS: 2.0000 | INHALATION_SPRAY | Freq: Four times a day (QID) | RESPIRATORY_TRACT | 2 refills | Status: DC | PRN

## 2022-04-09 MED ORDER — LOSARTAN POTASSIUM 50 MG PO TABS: 50.0000 mg | ORAL_TABLET | Freq: Every day | ORAL | 3 refills | Status: DC

## 2022-04-09 NOTE — Assessment & Plan Note (Signed)

## 2022-04-09 NOTE — Patient Instructions (Signed)
It was great to meet you today and I'm excited to have you join the [facility] practice. I hope you had a positive experience today! If you feel so inclined, please feel free to recommend our practice to friends and family. Chantrell Apsey, FNP-C  

## 2022-04-09 NOTE — Progress Notes (Signed)
New Patient Office Visit  Subjective    Patient ID: Janet [name], female    DOB: 1985/06/17  Age: 37 y.o. MRN: [id number]  CC:  Chief Complaint  Patient presents with   Establish Care    HPI Janet [name] presents to establish care. Oriented to practice routines and expectations. PMH includes HTN, anemia, asthma, depression, migraines, and PID. BP readings at home are between 130-150/90-101 and she monitors every other day. She does report recurrent headaches and foot and ankle swelling at work. She has been on Losartan 25 mg daily for several months and has tried Amlodipine but changed due to headaches. Concerns today include fatigue and daytime sleepiness and worsening migraines. PAP and Mammo normal last year. Vaccines UTD.    Outpatient Encounter Medications as of 04/09/2022  Medication Sig   Boric Acid CRYS Place 600 mg vaginally at bedtime. Use vaginally every night for two weeks then twice a week for six months   Etonogestrel (NEXPLANON [place]) Inject into the skin.   fluconazole (DIFLUCAN) 150 MG tablet Take one tablet by mouth every three days for three doses, then take one tablet once a week for six months   [DISCONTINUED] albuterol (VENTOLIN HFA) 108 (90 Base) MCG/ACT inhaler Inhale 2 puffs into the lungs every 6 (six) hours as needed for wheezing or shortness of breath.   [DISCONTINUED] losartan (COZAAR) 25 MG tablet Take 1 tablet (25 mg total) by mouth at bedtime.   albuterol (VENTOLIN HFA) 108 (90 Base) MCG/ACT inhaler Inhale 2 puffs into the lungs every 6 (six) hours as needed for wheezing or shortness of breath.   losartan (COZAAR) 50 MG tablet Take 1 tablet (50 mg total) by mouth at bedtime.   No facility-administered encounter medications on file as of 04/09/2022.    Past Medical History:  Diagnosis Date   Abdominal pain 05/06/2021   Anemia    Asthma    Bilateral knee pain 10/22/2020   Depression    Finger pain, left 11/26/2017   Gestational diabetes mellitus,  antepartum 08/12/2016   Moved pt to [place] GDM program Growth [place] at 38 week EFW 7#13oz, 82nd%   Left wrist pain 06/18/2020   Migraines    Right wrist pain 11/26/2017   Skin tag 05/11/2020    Past Surgical History:  Procedure Laterality Date   CESAREAN SECTION N/A 10/17/2016   Procedure: REPEAT CESAREAN SECTION;  Surgeon: Woodroe Mode, MD;  Location: [place];  Service: Obstetrics;  Laterality: N/A;   CESAREAN SECTION     DILATION AND CURETTAGE OF UTERUS      Family History  Problem Relation Age of Onset   Hypertension Mother    Heart disease Mother    Diabetes Father    Breast cancer Maternal Aunt 73    Social History   Socioeconomic History   Marital status: Married    Spouse name: Janet [name]   Number of children: 3   Years of education: college   Highest education level: Not on file  Occupational History   Not on file  Tobacco Use   Smoking status: Never   Smokeless tobacco: Never  Vaping Use   Vaping Use: Never used  Substance and Sexual Activity   Alcohol use: Yes    Comment: once every few months   Drug use: No   Sexual activity: Yes    Partners: Male    Birth control/protection: Implant  Other Topics Concern   Not on file  Social History Narrative   04/06/19  From: [place], moved to the [place] in 2016   Living: with Janet [name] (husband since 2017)   Work: stay at home mom      Family: Janet [name] and Janet [name], dispensing (twins 2010) and Janet [name] (2019), and 3 step children - Janet [name], holy, Janet [name]      Enjoys: sing professionally, reading      Exercise: goes to the gym when she can - 1-2 times a week   Diet: keto diet      Safety   Seat belts: Yes    Guns: No   Safe in relationships: Yes    Social Determinants of Radio broadcast assistant Strain: Not on file  Food Insecurity: Not on file  Transportation Needs: Not on file  Physical Activity: Not on file  Stress: Not on file  Social Connections: Not on file  Intimate Partner Violence: Not on file     Review of Systems  Constitutional:  Positive for malaise/fatigue.  HENT: Negative.    Eyes: Negative.   Respiratory: Negative.    Cardiovascular:  Positive for leg swelling.  Gastrointestinal: Negative.   Genitourinary: Negative.   Musculoskeletal: Negative.   Skin: Negative.   Neurological:  Positive for headaches.  Endo/Heme/Allergies: Negative.   Psychiatric/Behavioral: Negative.    All other systems reviewed and are negative.       Objective    BP (!) 118/100   Pulse 62   Temp 98.1 F (36.7 C) (Oral)   Ht 5\' 9"  (1.753 m)   Wt 243 lb (110.2 kg)   SpO2 96%   BMI 35.88 kg/m   Physical Exam Vitals and nursing note reviewed.  Constitutional:      Appearance: Normal appearance. She is obese.  HENT:     Head: Normocephalic and atraumatic.     Right Ear: Tympanic membrane, ear canal and external ear normal.     Left Ear: Tympanic membrane, ear canal and external ear normal.     Nose: Nose normal.     Mouth/Throat:     Mouth: Mucous membranes are moist.     Pharynx: Oropharynx is clear.  Eyes:     Extraocular Movements: Extraocular movements intact.     Conjunctiva/sclera: Conjunctivae normal.     Pupils: Pupils are equal, round, and reactive to light.  Cardiovascular:     Rate and Rhythm: Normal rate and regular rhythm.     Pulses: Normal pulses.     Heart sounds: Normal heart sounds.  Pulmonary:     Effort: Pulmonary effort is normal.     Breath sounds: Normal breath sounds.  Abdominal:     General: Bowel sounds are normal.     Palpations: Abdomen is soft.  Musculoskeletal:        General: Normal range of motion.     Cervical back: Normal range of motion and neck supple.  Skin:    General: Skin is warm and dry.     Capillary Refill: Capillary refill takes less than 2 seconds.  Neurological:     General: No focal deficit present.     Mental Status: She is alert and oriented to person, place, and time. Mental status is at baseline.  Psychiatric:         Mood and Affect: Mood normal.        Behavior: Behavior normal.        Thought Content: Thought content normal.        Judgment: Judgment normal.         Assessment &  Plan:   Problem List Items Addressed This Visit       Cardiovascular and Mediastinum   Essential hypertension    Chronic uncontrolled. Goal <130/80. Currently taking Losartan 25mg  daily, will increase to 50mg  daily. Encourage heart healthy diet and 150 minutes moderate intensity exercise weekly. Seek medical care for chest pain, shortness of breath, vision changes, palpitations, worsening headaches, swelling of extremities. Follow up in 1 month.      Relevant Medications   losartan (COZAAR) 50 MG tablet     Other   Fatigue    Ongoing for several months. She also reports she wakes up frequently at night, she does snore, and her husband reports pauses in breathing. Epworth Sleepiness Scale score 14. Will refer for sleep studies. Fasting labs done today to include CBC, TSH, B12, Vit D      Relevant Orders   TSH   Vitamin B12   VITAMIN D 25 Hydroxy (Vit-D Deficiency, Fractures)   Encounter to establish care with new doctor - Primary   Other Visit Diagnoses     Uncomplicated asthma, unspecified asthma severity, unspecified whether persistent       Relevant Medications   albuterol (VENTOLIN HFA) 108 (90 Base) MCG/ACT inhaler   Snoring       Relevant Orders   Ambulatory referral to Sleep Studies   Obesity (BMI 35.0-39.9 without comorbidity)       Relevant Orders   CBC with Differential/Platelet   COMPLETE METABOLIC PANEL WITH GFR   Lipid panel   TSH   Ambulatory referral to Endocrinology   History of gestational diabetes       Relevant Orders   Hemoglobin A1c       Return in about 4 weeks (around 05/07/2022) for BP.   Rubie Maid, FNP

## 2022-04-09 NOTE — Assessment & Plan Note (Signed)
Chronic uncontrolled. Goal <130/80. Currently taking Losartan 25mg  daily, will increase to 50mg  daily. Encourage heart healthy diet and 150 minutes moderate intensity exercise weekly. Seek medical care for chest pain, shortness of breath, vision changes, palpitations, worsening headaches, swelling of extremities. Follow up in 1 month.

## 2022-04-09 NOTE — Assessment & Plan Note (Signed)
Ongoing for several months. She also reports she wakes up frequently at night, she does snore, and her husband reports pauses in breathing. Epworth Sleepiness Scale score 14. Will refer for sleep studies. Fasting labs done today to include CBC, TSH, B12, Vit D

## 2022-04-10 ENCOUNTER — Other Ambulatory Visit: Payer: 59

## 2022-04-11 LAB — LIPID PANEL
Cholesterol: 145 mg/dL (ref <200)
HDL: 52 mg/dL (ref >=50)
LDL Cholesterol (Calc): 79 mg/dL (calc)
Non-HDL Cholesterol (Calc): 93 mg/dL (calc) (ref <130)
Total CHOL/HDL Ratio: 2.8 (calc) (ref <5.0)
Triglycerides: 55 mg/dL (ref <150)

## 2022-04-11 LAB — COMPLETE METABOLIC PANEL WITH GFR
AG Ratio: 1.8 (calc) (ref 1.0–2.5)
ALT: 14 U/L (ref 6–29)
AST: 18 U/L (ref 10–30)
Albumin: 4 g/dL (ref 3.6–5.1)
Alkaline phosphatase (APISO): 50 U/L (ref 31–125)
BUN: 14 mg/dL (ref 7–25)
CO2: 23 mmol/L (ref 20–32)
Calcium: 8.6 mg/dL (ref 8.6–10.2)
Chloride: 109 mmol/L (ref 98–110)
Creat: 0.75 mg/dL (ref 0.50–0.97)
Globulin: 2.2 g/dL (calc) (ref 1.9–3.7)
Glucose, Bld: 105 mg/dL — ABNORMAL HIGH (ref 65–99)
Potassium: 4.4 mmol/L (ref 3.5–5.3)
Sodium: 140 mmol/L (ref 135–146)
Total Bilirubin: 0.5 mg/dL (ref 0.2–1.2)
Total Protein: 6.2 g/dL (ref 6.1–8.1)
eGFR: 105 mL/min/{1.73_m2} (ref >=60)

## 2022-04-11 LAB — VITAMIN D 25 HYDROXY (VIT D DEFICIENCY, FRACTURES): Vit D, 25-Hydroxy: 28 ng/mL — ABNORMAL LOW (ref 30–100)

## 2022-04-11 LAB — CBC WITH DIFFERENTIAL/PLATELET
Absolute Monocytes: 340 cells/uL (ref 200–950)
Basophils Absolute: 21 cells/uL (ref 0–200)
Basophils Relative: 0.5 %
Eosinophils Absolute: 119 cells/uL (ref 15–500)
Eosinophils Relative: 2.9 %
HCT: 38.8 % (ref 35.0–45.0)
Hemoglobin: 12.5 g/dL (ref 11.7–15.5)
Lymphs Abs: 2218 cells/uL (ref 850–3900)
MCH: 27.7 pg (ref 27.0–33.0)
MCHC: 32.2 g/dL (ref 32.0–36.0)
MCV: 86 fL (ref 80.0–100.0)
MPV: 11.5 fL (ref 7.5–12.5)
Monocytes Relative: 8.3 %
Neutro Abs: 1402 cells/uL — ABNORMAL LOW (ref 1500–7800)
Neutrophils Relative %: 34.2 %
Platelets: 221 10*3/uL (ref 140–400)
RBC: 4.51 10*6/uL (ref 3.80–5.10)
RDW: 13.4 % (ref 11.0–15.0)
Total Lymphocyte: 54.1 %
WBC: 4.1 10*3/uL (ref 3.8–10.8)

## 2022-04-11 LAB — VITAMIN B12: Vitamin B-12: 674 pg/mL (ref 200–1100)

## 2022-04-11 LAB — HEMOGLOBIN A1C
Hgb A1c MFr Bld: 5.2 % of total Hgb (ref <5.7)
Mean Plasma Glucose: 103 mg/dL
eAG (mmol/L): 5.7 mmol/L

## 2022-04-11 LAB — TSH: TSH: 1.28 mIU/L

## 2022-04-14 DIAGNOSIS — Z419 Encounter for procedure for purposes other than remedying health state, unspecified: Secondary | ICD-10-CM | POA: Diagnosis not present

## 2022-04-17 ENCOUNTER — Other Ambulatory Visit: Payer: Self-pay | Admitting: Family Medicine

## 2022-04-17 DIAGNOSIS — E559 Vitamin D deficiency, unspecified: Secondary | ICD-10-CM

## 2022-05-08 ENCOUNTER — Encounter: Payer: Self-pay | Admitting: "Endocrinology

## 2022-05-12 ENCOUNTER — Ambulatory Visit: Payer: 59 | Admitting: Family Medicine

## 2022-05-14 DIAGNOSIS — Z419 Encounter for procedure for purposes other than remedying health state, unspecified: Secondary | ICD-10-CM | POA: Diagnosis not present

## 2022-05-28 DIAGNOSIS — Z131 Encounter for screening for diabetes mellitus: Secondary | ICD-10-CM | POA: Diagnosis not present

## 2022-05-28 DIAGNOSIS — E669 Obesity, unspecified: Secondary | ICD-10-CM | POA: Diagnosis not present

## 2022-05-28 DIAGNOSIS — E559 Vitamin D deficiency, unspecified: Secondary | ICD-10-CM | POA: Diagnosis not present

## 2022-05-28 DIAGNOSIS — R635 Abnormal weight gain: Secondary | ICD-10-CM | POA: Diagnosis not present

## 2022-05-28 DIAGNOSIS — E781 Pure hyperglyceridemia: Secondary | ICD-10-CM | POA: Diagnosis not present

## 2022-05-28 DIAGNOSIS — Z3200 Encounter for pregnancy test, result unknown: Secondary | ICD-10-CM | POA: Diagnosis not present

## 2022-05-28 DIAGNOSIS — J45909 Unspecified asthma, uncomplicated: Secondary | ICD-10-CM | POA: Diagnosis not present

## 2022-05-28 DIAGNOSIS — Z0130 Encounter for examination of blood pressure without abnormal findings: Secondary | ICD-10-CM | POA: Diagnosis not present

## 2022-05-28 DIAGNOSIS — Z6836 Body mass index (BMI) 36.0-36.9, adult: Secondary | ICD-10-CM | POA: Diagnosis not present

## 2022-05-28 DIAGNOSIS — I10 Essential (primary) hypertension: Secondary | ICD-10-CM | POA: Diagnosis not present

## 2022-05-28 DIAGNOSIS — R0602 Shortness of breath: Secondary | ICD-10-CM | POA: Diagnosis not present

## 2022-05-28 DIAGNOSIS — R5383 Other fatigue: Secondary | ICD-10-CM | POA: Diagnosis not present

## 2022-06-14 DIAGNOSIS — Z419 Encounter for procedure for purposes other than remedying health state, unspecified: Secondary | ICD-10-CM | POA: Diagnosis not present

## 2022-06-25 ENCOUNTER — Telehealth: Payer: Self-pay | Admitting: Family Medicine

## 2022-06-25 NOTE — Telephone Encounter (Signed)
Left message to return call to reschedule 1 month follow up appointment patient missed end of April.

## 2022-07-09 ENCOUNTER — Other Ambulatory Visit: Payer: Self-pay | Admitting: "Endocrinology

## 2022-07-09 ENCOUNTER — Encounter: Payer: Self-pay | Admitting: "Endocrinology

## 2022-07-09 ENCOUNTER — Ambulatory Visit (INDEPENDENT_AMBULATORY_CARE_PROVIDER_SITE_OTHER): Payer: Medicaid Other | Admitting: "Endocrinology

## 2022-07-09 VITALS — BP 132/96 | HR 76 | Ht 69.0 in | Wt 251.8 lb

## 2022-07-09 DIAGNOSIS — I10 Essential (primary) hypertension: Secondary | ICD-10-CM | POA: Diagnosis not present

## 2022-07-09 DIAGNOSIS — Z6837 Body mass index (BMI) 37.0-37.9, adult: Secondary | ICD-10-CM

## 2022-07-09 MED ORDER — WEGOVY 0.25 MG/0.5ML ~~LOC~~ SOAJ: 0.2500 mg | SUBCUTANEOUS | 0 refills | Status: DC

## 2022-07-09 NOTE — Patient Instructions (Signed)

## 2022-07-09 NOTE — Progress Notes (Signed)
Endocrinology Consult Note                                            07/09/2022, 12:36 PM   Subjective:    Patient ID: Janet [name], female    DOB: 12/31/1985, PCP Park Meo, FNP   Past Medical History:  Diagnosis Date   Abdominal pain 05/06/2021   Anemia    Asthma    Bilateral knee pain 10/22/2020   Depression    Finger pain, left 11/26/2017   Gestational diabetes mellitus, antepartum 08/12/2016   Moved pt to [facility] GDM program Growth [place] at 38 week EFW 7#13oz, 82nd%   Left wrist pain 06/18/2020   Migraines    Right wrist pain 11/26/2017   Skin tag 05/11/2020   Past Surgical History:  Procedure Laterality Date   CESAREAN SECTION N/A 10/17/2016   Procedure: REPEAT CESAREAN SECTION;  Surgeon: Adam Phenix, MD;  Location: [facility]IR[facility];  Service: Obstetrics;  Laterality: N/A;   CESAREAN SECTION     DILATION AND CURETTAGE OF UTERUS     Social History   Socioeconomic History   Marital status: Married    Spouse name: [name]   Number of children: 3   Years of education: college   Highest education level: Not on file  Occupational History   Not on file  Tobacco Use   Smoking status: Never   Smokeless tobacco: Never  Vaping Use   Vaping Use: Never used  Substance and Sexual Activity   Alcohol use: Not Currently    Comment: once every few months   Drug use: No   Sexual activity: Yes    Partners: Male    Birth control/protection: Implant  Other Topics Concern   Not on file  Social History Narrative   04/06/19   From: [place], moved to the [place] in 2016   Living: with [name] (husband since 2017)   Work: stay at home mom      Family: Dorinda Hill and Special educational needs teacher (twins 2010) and Merdis Delay (2019), and 3 step children - Amblessed, holy, Ventura Sellers      Enjoys: sing professionally, reading      Exercise: goes to the gym when she can - 1-2 times a week   Diet: keto diet      Safety   Seat belts: Yes    Guns: No   Safe in relationships: Yes    Social  Determinants of Corporate investment banker Strain: Not on file  Food Insecurity: Not on file  Transportation Needs: Not on file  Physical Activity: Not on file  Stress: Not on file  Social Connections: Not on file   Family History  Problem Relation Age of Onset   Hypertension Mother    Heart disease Mother    Diabetes Father    Breast cancer Maternal Aunt 31   Outpatient Encounter Medications as of 07/09/2022  Medication Sig   Semaglutide-Weight Management (WEGOVY) 0.25 MG/0.5ML SOAJ Inject 0.25 mg into the skin once a week.   albuterol (VENTOLIN HFA) 108 (90 Base) MCG/ACT inhaler Inhale 2 puffs into the lungs every 6 (six) hours as needed for wheezing or shortness of breath.   Boric Acid CRYS Place 600 mg vaginally at bedtime. Use vaginally every night for two weeks then twice a week for six months   Etonogestrel (NEXPLANON [place]) Inject  into the skin.   fluconazole (DIFLUCAN) 150 MG tablet Take one tablet by mouth every three days for three doses, then take one tablet once a week for six months (Patient not taking: Reported on 07/09/2022)   losartan (COZAAR) 50 MG tablet Take 1 tablet (50 mg total) by mouth at bedtime.   No facility-administered encounter medications on file as of 07/09/2022.   ALLERGIES: Allergies  Allergen Reactions   Flagyl [Metronidazole] Itching   Ibuprofen Swelling    Facial swelling   Nsaids Other (See Comments)    Stomach ulcers   Ondansetron Hcl Other (See Comments)    Pt states "it made me crazy"   Other     Other reaction(s): Other (See Comments) Stomach ulcers   Tramadol Itching   Levofloxacin In D5w Rash and Itching    VACCINATION STATUS: Immunization History  Administered Date(s) Administered   Hepatitis B 06/03/2021   Influenza,inj,Quad PF,6+ Mos 10/08/2016, 11/19/2017, 12/20/2019, 11/05/2020, 11/06/2021   MMR 05/29/2021   PFIZER(Purple Top)SARS-COV-2 Vaccination 04/14/2019, 05/14/2019, 12/20/2019   Pfizer Covid-19 Vaccine Bivalent  Booster 19yrs & up 01/09/2021   Tdap 07/24/2016    HPI Janet [name] is 37 y.o. female who presents today with a medical history as above. she is being seen in consultation for obesity requested by Park Meo, FNP. History is obtained directly from the patient as well as her chart review.  She reports that she has been gaining weight progressively over the last few years.  She gives history of gestational diabetes.  She does have hypertension on treatment.  Her most recent A1c was 5.2%.  She does not have exposure to steroids.  She has family history of type 2 diabetes in both parents.  Evidently she has changed her diet recently switching to more unprocessed food items. She does not have acute complaints today, except that she does have occasional mood swings and sleep disturbance.  She is a second shift Financial controller.  Review of Systems  Constitutional: + Progressively gaining weight, no fatigue, no subjective hyperthermia, no subjective hypothermia Eyes: no blurry vision, no xerophthalmia ENT: no sore throat, no nodules palpated in throat, no dysphagia/odynophagia, no hoarseness Cardiovascular: no Chest Pain, no Shortness of Breath, no palpitations, no leg swelling Respiratory: no cough, no shortness of breath Gastrointestinal: no Nausea/Vomiting/Diarhhea Musculoskeletal: no muscle/joint aches Skin: no rashes Neurological: no tremors, no numbness, no tingling, no dizziness Psychiatric: no depression, no anxiety  Objective:       07/09/2022    9:59 AM 07/09/2022    9:44 AM 04/09/2022    9:30 AM  Vitals with BMI  Height  5\' 9"    Weight  251 lbs 13 oz   BMI  37.17   Systolic 132 134 098  Diastolic 96 98 100  Pulse  76     BP (!) 132/96 Comment: left arm with manuel cuff  Pulse 76   Ht 5\' 9"  (1.753 m)   Wt 251 lb 12.8 oz (114.2 kg)   BMI 37.18 kg/m   Wt Readings from Last 3 Encounters:  07/09/22 251 lb 12.8 oz (114.2 kg)  04/09/22 243 lb (110.2 kg)  12/24/21 254 lb (115.2  kg)    Physical Exam  Constitutional:  Body mass index is 37.18 kg/m.,  not in acute distress, normal state of mind Eyes: PERRLA, EOMI, no exophthalmos ENT: moist mucous membranes, no gross thyromegaly, no gross cervical lymphadenopathy Cardiovascular: normal precordial activity, Regular Rate and Rhythm, no Murmur/Rubs/Gallops Respiratory:  adequate breathing efforts, no gross  chest deformity, Clear to auscultation bilaterally Gastrointestinal: abdomen soft, Non -tender, No distension, Bowel Sounds present, no gross organomegaly Musculoskeletal: no gross deformities, strength intact in all four extremities, no peripheral edema Skin: moist, warm, no rashes Neurological: no tremor with outstretched hands, Deep tendon reflexes normal in bilateral lower extremities.  CMP ( most recent) CMP     Component Value Date/Time   NA 140 04/09/2022 1006   NA 142 11/18/2021 1631   K 4.4 04/09/2022 1006   CL 109 04/09/2022 1006   CO2 23 04/09/2022 1006   GLUCOSE 105 (H) 04/09/2022 1006   BUN 14 04/09/2022 1006   BUN 10 11/18/2021 1631   CREATININE 0.75 04/09/2022 1006   CALCIUM 8.6 04/09/2022 1006   PROT 6.2 04/09/2022 1006   PROT 7.2 09/02/2021 1050   ALBUMIN 4.6 09/02/2021 1050   AST 18 04/09/2022 1006   ALT 14 04/09/2022 1006   ALKPHOS 55 09/02/2021 1050   BILITOT 0.5 04/09/2022 1006   BILITOT 0.9 09/02/2021 1050   GFR 119.33 04/06/2019 1151   EGFR 105 04/09/2022 1006   EGFR 90 11/18/2021 1631   GFRNONAA >60 08/17/2021 0447     Diabetic Labs (most recent): Lab Results  Component Value Date   HGBA1C 5.2 04/09/2022   HGBA1C 5.3 05/06/2021   HGBA1C 5.4 06/18/2020     Lipid Panel ( most recent) Lipid Panel     Component Value Date/Time   CHOL 145 04/09/2022 1006   CHOL 154 05/06/2021 1633   TRIG 55 04/09/2022 1006   HDL 52 04/09/2022 1006   HDL 50 05/06/2021 1633   CHOLHDL 2.8 04/09/2022 1006   LDLCALC 79 04/09/2022 1006   LABVLDL 16 05/06/2021 1633      Lab Results   Component Value Date   TSH 1.28 04/09/2022   TSH 1.340 05/06/2021   TSH 1.510 04/24/2020   TSH 1.180 12/20/2019   TSH 1.49 04/06/2019   FREET4 1.26 12/20/2019           Assessment & Plan:   1. Class 2 severe obesity due to excess calories with serious comorbidity and body mass index (BMI) of 37.0 to 37.9 in adult [facility]) 2. Essential hypertension, benign   - Rynlee Lisbon  is being seen at a kind request of Park Meo, FNP. - I have reviewed her available  records and clinically evaluated the patient. - Based on these reviews, she has class 2 obesity complicated by hypertension and possible sleep disturbance . It is unlikely that she has endocrine syndrome to cause her progressive weight gain.  However, she will be offered baseline adrenal function by doing 24-hour urine free cortisol measurement.  She will also be offered full set thyroid function tests. In light of her history of gestational diabetes putting her at risk for type 2 diabetes, progressive weight gain causing class 2  obesity, hypertension, mood swings as well as sleep disturbance, she is a perfect candidate for lifestyle medicine.  - she acknowledges that there is a room for improvement in her food and drink choices. - Suggestion is made for her to avoid simple carbohydrates  from her diet including Cakes, Sweet Desserts, Ice Cream, Soda (diet and regular), Sweet Tea, Candies, Chips, Cookies, Store Bought Juices, Alcohol , Artificial Sweeteners,  Coffee Creamer, and "Sugar-free" Products, Lemonade. This will help patient to have more stable blood glucose profile and potentially avoid unintended weight gain.  The following Lifestyle Medicine recommendations according to [facility] of Lifestyle Medicine  [facility])  were discussed and and offered to patient and she  agrees to start the journey:  A. Whole Foods, Plant-Based Nutrition comprising of fruits and vegetables, plant-based proteins, whole-grain carbohydrates  was discussed in detail with the patient.   A list for source of those nutrients were also provided to the patient.  Patient will use only water or unsweetened tea for hydration. B.  The need to stay away from risky substances including alcohol, smoking; obtaining 7 to 9 hours of restorative sleep, at least 150 minutes of moderate intensity exercise weekly, the importance of healthy social connections,  and stress management techniques were discussed. C.  A full color page of  Calorie density of various food groups per pound showing examples of each food groups was provided to the patient.   - she is advised to maintain close follow up with Park Meo, FNP for primary care needs.   -Thank you for involving me in the care of this pleasant patient.  Time spent with the patient: 60  minutes, of which >50% was spent in  counseling her about her obesity, hypertension, and the rest in obtaining information about her symptoms, reviewing her previous labs/studies ( including abstractions from other facilities),  evaluations, and treatments,  and developing a plan to confirm diagnosis and long term treatment based on the latest standards of care/guidelines; and documenting her care.  Mckinze Poirier participated in the discussions, expressed understanding, and voiced agreement with the above plans.  All questions were answered to her satisfaction. she is encouraged to contact clinic should she have any questions or concerns prior to her return visit.  Follow up plan: Return in about 2 weeks (around 07/23/2022) for F/U with Pre-visit Labs.   Marquis Lunch, MD [facility] [address] [place], [place] [address] Phone: [phone]  Fax: [phone]     07/09/2022, 12:36 PM  This note was partially dictated with voice recognition software. Similar sounding words can be transcribed inadequately or may not  be corrected upon review.

## 2022-07-14 DIAGNOSIS — Z419 Encounter for procedure for purposes other than remedying health state, unspecified: Secondary | ICD-10-CM | POA: Diagnosis not present

## 2022-07-16 ENCOUNTER — Other Ambulatory Visit: Payer: Self-pay

## 2022-07-16 DIAGNOSIS — Z6837 Body mass index (BMI) 37.0-37.9, adult: Secondary | ICD-10-CM | POA: Diagnosis not present

## 2022-07-16 DIAGNOSIS — R5383 Other fatigue: Secondary | ICD-10-CM | POA: Diagnosis not present

## 2022-07-17 LAB — TSH: TSH: 1.48 mIU/L

## 2022-07-18 LAB — T3, FREE: T3, Free: 3.6 pg/mL (ref 2.3–4.2)

## 2022-07-18 LAB — THYROGLOBULIN ANTIBODY: Thyroglobulin Ab: <1 IU/mL (ref <=1)

## 2022-07-18 LAB — T4, FREE: Free T4: 1.2 ng/dL (ref 0.8–1.8)

## 2022-07-18 LAB — THYROID PEROXIDASE ANTIBODY: Thyroperoxidase Ab SerPl-aCnc: 1 IU/mL (ref <9)

## 2022-07-22 DIAGNOSIS — Z6837 Body mass index (BMI) 37.0-37.9, adult: Secondary | ICD-10-CM | POA: Diagnosis not present

## 2022-07-22 NOTE — Telephone Encounter (Signed)
Pt said that has been trying to get [facility] but has been out of stock.   Please Advise on a substitute .Marland Kitchen Thanks.

## 2022-07-23 LAB — CREATININE, URINE, 24 HOUR: Creatinine, 24H Ur: 1.06 g/(24.h) (ref 0.50–2.15)

## 2022-07-25 ENCOUNTER — Encounter: Payer: Self-pay | Admitting: "Endocrinology

## 2022-07-25 ENCOUNTER — Ambulatory Visit (INDEPENDENT_AMBULATORY_CARE_PROVIDER_SITE_OTHER): Payer: BLUE CROSS/BLUE SHIELD | Admitting: "Endocrinology

## 2022-07-25 ENCOUNTER — Other Ambulatory Visit (HOSPITAL_COMMUNITY): Payer: Self-pay

## 2022-07-25 VITALS — BP 132/84 | HR 72 | Ht 69.0 in | Wt 255.6 lb

## 2022-07-25 DIAGNOSIS — Z6837 Body mass index (BMI) 37.0-37.9, adult: Secondary | ICD-10-CM

## 2022-07-25 DIAGNOSIS — I10 Essential (primary) hypertension: Secondary | ICD-10-CM | POA: Diagnosis not present

## 2022-07-25 MED ORDER — WEGOVY 1 MG/0.5ML ~~LOC~~ SOAJ
1.0000 mg | SUBCUTANEOUS | 0 refills | Status: DC
  Filled 2022-07-25: qty 2, fill #0
  Filled 2022-07-28: qty 2, 28d supply, fill #0
  Filled 2022-11-29: qty 2, fill #0
  Filled 2022-12-23 – 2023-01-29 (×2): qty 2, 28d supply, fill #0

## 2022-07-25 MED ORDER — WEGOVY 0.5 MG/0.5ML ~~LOC~~ SOAJ
0.5000 mg | SUBCUTANEOUS | 0 refills | Status: DC
  Filled 2022-07-25 – 2023-04-15 (×4): qty 2, 28d supply, fill #0

## 2022-07-25 NOTE — Patient Instructions (Signed)

## 2022-07-25 NOTE — Progress Notes (Signed)
Endocrinology follow-up note                                             07/25/2022, 3:04 PM   Subjective:    Patient ID: Janet [name], female    DOB: June 24, 1985, PCP Park Meo, FNP   Past Medical History:  Diagnosis Date   Abdominal pain 05/06/2021   Anemia    Asthma    Bilateral knee pain 10/22/2020   Depression    Finger pain, left 11/26/2017   Gestational diabetes mellitus, antepartum 08/12/2016   Moved pt to BRX GDM program Growth [place] at 38 week EFW 7#13oz, 82nd%   Left wrist pain 06/18/2020   Migraines    Right wrist pain 11/26/2017   Skin tag 05/11/2020   Past Surgical History:  Procedure Laterality Date   CESAREAN SECTION N/A 10/17/2016   Procedure: REPEAT CESAREAN SECTION;  Surgeon: Adam Phenix, MD;  Location: [facility] BIRTHING SUITES;  Service: Obstetrics;  Laterality: N/A;   CESAREAN SECTION     DILATION AND CURETTAGE OF UTERUS     Social History   Socioeconomic History   Marital status: Married    Spouse name: [name]   Number of children: 3   Years of education: college   Highest education level: Not on file  Occupational History   Not on file  Tobacco Use   Smoking status: Never   Smokeless tobacco: Never  Vaping Use   Vaping status: Never Used  Substance and Sexual Activity   Alcohol use: Not Currently    Comment: once every few months   Drug use: No   Sexual activity: Yes    Partners: Male    Birth control/protection: Implant  Other Topics Concern   Not on file  Social History Narrative   04/06/19   From: [place], moved to the [place] in 2016   Living: with [name] (husband since 2017)   Work: stay at home mom      Family: Dorinda Hill and Special educational needs teacher (twins 2010) and Merdis Delay (2019), and 3 step children - Amblessed, holy, Ventura Sellers      Enjoys: sing professionally, reading      Exercise: goes to the gym when she can - 1-2 times a week   Diet: keto diet      Safety   Seat belts: Yes    Guns: No   Safe in relationships: Yes     Social Determinants of Corporate investment banker Strain: Not on file  Food Insecurity: Not on file  Transportation Needs: Not on file  Physical Activity: Not on file  Stress: Not on file  Social Connections: Not on file   Family History  Problem Relation Age of Onset   Hypertension Mother    Heart disease Mother    Diabetes Father    Breast cancer Maternal Aunt 28   Outpatient Encounter Medications as of 07/25/2022  Medication Sig   Semaglutide-Weight Management (WEGOVY) 0.5 MG/0.5ML SOAJ Inject 0.5 mg into the skin once a week.   Semaglutide-Weight Management (WEGOVY) 1 MG/0.5ML SOAJ Inject 1 mg into the skin once a week.   albuterol (VENTOLIN HFA) 108 (90 Base) MCG/ACT inhaler Inhale 2 puffs into the lungs every 6 (six) hours as needed for wheezing or shortness of breath.   Boric Acid CRYS Place 600 mg vaginally at bedtime. Use  vaginally every night for two weeks then twice a week for six months   Etonogestrel (NEXPLANON [place]) Inject into the skin.   fluconazole (DIFLUCAN) 150 MG tablet Take one tablet by mouth every three days for three doses, then take one tablet once a week for six months (Patient not taking: Reported on 07/09/2022)   losartan (COZAAR) 50 MG tablet Take 1 tablet (50 mg total) by mouth at bedtime.   [DISCONTINUED] Semaglutide-Weight Management (WEGOVY) 0.25 MG/0.5ML SOAJ Inject 0.25 mg into the skin once a week.   No facility-administered encounter medications on file as of 07/25/2022.   ALLERGIES: Allergies  Allergen Reactions   Flagyl [Metronidazole] Itching   Ibuprofen Swelling    Facial swelling   Nsaids Other (See Comments)    Stomach ulcers   Ondansetron Hcl Other (See Comments)    Pt states "it made me crazy"   Other     Other reaction(s): Other (See Comments) Stomach ulcers   Tramadol Itching   Levofloxacin In D5w Rash and Itching    VACCINATION STATUS: Immunization History  Administered Date(s) Administered   Hepatitis B 06/03/2021    Influenza,inj,Quad PF,6+ Mos 10/08/2016, 11/19/2017, 12/20/2019, 11/05/2020, 11/06/2021   MMR 05/29/2021   PFIZER(Purple Top)SARS-COV-2 Vaccination 04/14/2019, 05/14/2019, 12/20/2019   Pfizer Covid-19 Vaccine Bivalent Booster 1yrs & up 01/09/2021   Tdap 07/24/2016    HPI Janet [name] is 37 y.o. female who presents today with a medical history as above. she is being seen in follow-up after she was seen in consultation for obesity requested by Park Meo, FNP. -See notes from her first visit.  A prescription for [facility] was given to her at 0.25 mg subcutaneously weekly.  However this dose has not been available and patient was informed that his abdomen from 0.5 mg and up.  She would like to start with 0.5 mg subcutaneously weekly.  She reports that she has been gaining weight progressively over the last few years.  She gives history of gestational diabetes.  She does have hypertension on treatment.  Her most recent A1c was 5.2%.  She does not have exposure to steroids.  She has family history of type 2 diabetes in both parents.  Her 24-hour urine cortisol measurement was not complete.  Her previsit thyroid function tests are consistent with euthyroid state. Evidently she has changed her diet recently switching to more unprocessed food items. She does not have acute complaints today, except that she wishes to achieve some weight loss.    She is a second shift worker, in transition to change.  Review of Systems  Constitutional: + Progressively gaining weight, no fatigue, no subjective hyperthermia, no subjective hypothermia Eyes: no blurry vision, no xerophthalmia ENT: no sore throat, no nodules palpated in throat, no dysphagia/odynophagia, no hoarseness Cardiovascular: no Chest Pain, no Shortness of Breath, no palpitations, no leg swelling Respiratory: no cough, no shortness of breath Gastrointestinal: no Nausea/Vomiting/Diarhhea Musculoskeletal: no muscle/joint aches Skin: no  rashes Neurological: no tremors, no numbness, no tingling, no dizziness Psychiatric: no depression, no anxiety  Objective:       07/25/2022   10:32 AM 07/09/2022    9:59 AM 07/09/2022    9:44 AM  Vitals with BMI  Height 5\' 9"   5\' 9"   Weight 255 lbs 10 oz  251 lbs 13 oz  BMI 37.73  37.17  Systolic 132 132 161  Diastolic 84 96 98  Pulse 72  76    BP 132/84   Pulse 72   Ht 5'  9" (1.753 m)   Wt 255 lb 9.6 oz (115.9 kg)   BMI 37.75 kg/m   Wt Readings from Last 3 Encounters:  07/25/22 255 lb 9.6 oz (115.9 kg)  07/09/22 251 lb 12.8 oz (114.2 kg)  04/09/22 243 lb (110.2 kg)    Physical Exam  Constitutional:  Body mass index is 37.75 kg/m.,  not in acute distress, normal state of mind Eyes: PERRLA, EOMI, no exophthalmos ENT: moist mucous membranes, no gross thyromegaly, no gross cervical lymphadenopathy   CMP ( most recent) CMP     Component Value Date/Time   NA 140 04/09/2022 1006   NA 142 11/18/2021 1631   K 4.4 04/09/2022 1006   CL 109 04/09/2022 1006   CO2 23 04/09/2022 1006   GLUCOSE 105 (H) 04/09/2022 1006   BUN 14 04/09/2022 1006   BUN 10 11/18/2021 1631   CREATININE 0.75 04/09/2022 1006   CALCIUM 8.6 04/09/2022 1006   PROT 6.2 04/09/2022 1006   PROT 7.2 09/02/2021 1050   ALBUMIN 4.6 09/02/2021 1050   AST 18 04/09/2022 1006   ALT 14 04/09/2022 1006   ALKPHOS 55 09/02/2021 1050   BILITOT 0.5 04/09/2022 1006   BILITOT 0.9 09/02/2021 1050   GFR 119.33 04/06/2019 1151   EGFR 105 04/09/2022 1006   EGFR 90 11/18/2021 1631   GFRNONAA >60 08/17/2021 0447     Diabetic Labs (most recent): Lab Results  Component Value Date   HGBA1C 5.2 04/09/2022   HGBA1C 5.3 05/06/2021   HGBA1C 5.4 06/18/2020     Lipid Panel ( most recent) Lipid Panel     Component Value Date/Time   CHOL 145 04/09/2022 1006   CHOL 154 05/06/2021 1633   TRIG 55 04/09/2022 1006   HDL 52 04/09/2022 1006   HDL 50 05/06/2021 1633   CHOLHDL 2.8 04/09/2022 1006   LDLCALC 79 04/09/2022  1006   LABVLDL 16 05/06/2021 1633      Lab Results  Component Value Date   TSH 1.48 07/16/2022   TSH 1.28 04/09/2022   TSH 1.340 05/06/2021   TSH 1.510 04/24/2020   TSH 1.180 12/20/2019   FREET4 1.2 07/16/2022   FREET4 1.26 12/20/2019           Assessment & Plan:   1. Class 2 severe obesity due to excess calories with serious comorbidity and body mass index (BMI) of 37.0 to 37.9 in adult [facility]) 2. Essential hypertension, benign  - I have reviewed her available  records and clinically evaluated the patient. - Based on these reviews, she has class 2 obesity complicated by hypertension and possible sleep disturbance . Her recent thyroid function test are consistent with euthyroid state. It is unlikely that she has endocrine syndrome to cause her progressive weight gain.  However, she will have 24-hour urine free cortisol measurement to rule out Cushing syndrome.    In light of her history of gestational diabetes putting her at risk for type 2 diabetes, progressive weight gain causing class 2  obesity, hypertension, mood swings as well as sleep disturbance, she is a good candidate for medication intervention to achieve some weight loss.    With direct discussion with her, she would like to start Wegovy at 0.5 mg subcutaneously weekly.  This medication will be advised as tolerated. She remains perfect candidate for lifestyle medicine.  - she acknowledges that there is a room for improvement in her food and drink choices. - Suggestion is made for her to avoid simple carbohydrates  from  her diet including Cakes, Sweet Desserts, Ice Cream, Soda (diet and regular), Sweet Tea, Candies, Chips, Cookies, Store Bought Juices, Alcohol , Artificial Sweeteners,  Coffee Creamer, and "Sugar-free" Products, Lemonade. This will help patient to have more stable blood glucose profile and potentially avoid unintended weight gain.  The following Lifestyle Medicine recommendations according to American  College of Lifestyle Medicine  [facility]) were discussed and and offered to patient and she  agrees to start the journey:  A. Whole Foods, Plant-Based Nutrition comprising of fruits and vegetables, plant-based proteins, whole-grain carbohydrates was discussed in detail with the patient.   A list for source of those nutrients were also provided to the patient.  Patient will use only water or unsweetened tea for hydration. B.  The need to stay away from risky substances including alcohol, smoking; obtaining 7 to 9 hours of restorative sleep, at least 150 minutes of moderate intensity exercise weekly, the importance of healthy social connections,  and stress management techniques were discussed. C.  A full color page of  Calorie density of various food groups per pound showing examples of each food groups was provided to the patient.   - she is advised to maintain close follow up with Park Meo, FNP for primary care needs.   I spent  25  minutes in the care of the patient today including review of labs from Thyroid Function, CMP, and other relevant labs ; imaging/biopsy records (current and previous including abstractions from other facilities); face-to-face time discussing  her lab results and symptoms, medications doses, her options of short and long term treatment based on the latest standards of care / guidelines;   and documenting the encounter.  Janet [name]  participated in the discussions, expressed understanding, and voiced agreement with the above plans.  All questions were answered to her satisfaction. she is encouraged to contact clinic should she have any questions or concerns prior to her return visit.   Follow up plan: Return in about 3 months (around 10/25/2022) for F/U with Pre-visit Labs.   Marquis Lunch, MD [facility] [address] [place], [place] [address] Phone: [phone]  Fax: [phone]     07/25/2022, 3:04  PM  This note was partially dictated with voice recognition software. Similar sounding words can be transcribed inadequately or may not  be corrected upon review.

## 2022-07-28 ENCOUNTER — Other Ambulatory Visit: Payer: Self-pay | Admitting: "Endocrinology

## 2022-07-28 ENCOUNTER — Other Ambulatory Visit (HOSPITAL_COMMUNITY): Payer: Self-pay

## 2022-07-28 MED ORDER — WEGOVY 0.25 MG/0.5ML ~~LOC~~ SOAJ
0.2500 mg | SUBCUTANEOUS | 0 refills | Status: DC
  Filled 2022-07-28 – 2022-11-29 (×2): qty 2, 28d supply, fill #0

## 2022-07-29 ENCOUNTER — Emergency Department
Admission: EM | Admit: 2022-07-29 | Discharge: 2022-07-29 | Discharge status: Against Medical Advice | Payer: BLUE CROSS/BLUE SHIELD | Attending: Emergency Medicine | Admitting: Emergency Medicine

## 2022-07-29 ENCOUNTER — Other Ambulatory Visit: Payer: Self-pay

## 2022-07-29 ENCOUNTER — Other Ambulatory Visit (HOSPITAL_COMMUNITY): Payer: Self-pay

## 2022-07-29 ENCOUNTER — Encounter: Payer: Self-pay | Admitting: Emergency Medicine

## 2022-07-29 ENCOUNTER — Telehealth: Payer: Self-pay

## 2022-07-29 DIAGNOSIS — I10 Essential (primary) hypertension: Secondary | ICD-10-CM | POA: Diagnosis not present

## 2022-07-29 DIAGNOSIS — G44209 Tension-type headache, unspecified, not intractable: Secondary | ICD-10-CM | POA: Insufficient documentation

## 2022-07-29 DIAGNOSIS — Y92410 Unspecified street and highway as the place of occurrence of the external cause: Secondary | ICD-10-CM | POA: Insufficient documentation

## 2022-07-29 DIAGNOSIS — Z5321 Procedure and treatment not carried out due to patient leaving prior to being seen by health care provider: Secondary | ICD-10-CM | POA: Diagnosis not present

## 2022-07-29 DIAGNOSIS — R002 Palpitations: Secondary | ICD-10-CM | POA: Insufficient documentation

## 2022-07-29 DIAGNOSIS — R1013 Epigastric pain: Secondary | ICD-10-CM | POA: Diagnosis not present

## 2022-07-29 DIAGNOSIS — Z743 Need for continuous supervision: Secondary | ICD-10-CM | POA: Diagnosis not present

## 2022-07-29 NOTE — Telephone Encounter (Signed)
Left a message making pt aware Dr.Nida has sent in Rx for Wegovy 0.25mg  to Janet [name]- [facility].
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
## 2022-07-30 ENCOUNTER — Emergency Department (HOSPITAL_COMMUNITY)
Admission: EM | Admit: 2022-07-30 | Discharge: 2022-07-31 | Disposition: A | Discharge status: Home / Self Care | Payer: No Typology Code available for payment source | Attending: Emergency Medicine | Admitting: Emergency Medicine

## 2022-07-30 ENCOUNTER — Emergency Department (HOSPITAL_COMMUNITY): Payer: No Typology Code available for payment source

## 2022-07-30 ENCOUNTER — Other Ambulatory Visit (HOSPITAL_COMMUNITY): Payer: Self-pay

## 2022-07-30 DIAGNOSIS — S199XXA Unspecified injury of neck, initial encounter: Secondary | ICD-10-CM | POA: Diagnosis not present

## 2022-07-30 DIAGNOSIS — F419 Anxiety disorder, unspecified: Secondary | ICD-10-CM | POA: Diagnosis not present

## 2022-07-30 DIAGNOSIS — Z794 Long term (current) use of insulin: Secondary | ICD-10-CM | POA: Insufficient documentation

## 2022-07-30 DIAGNOSIS — Y92410 Unspecified street and highway as the place of occurrence of the external cause: Secondary | ICD-10-CM | POA: Insufficient documentation

## 2022-07-30 DIAGNOSIS — R519 Headache, unspecified: Secondary | ICD-10-CM | POA: Insufficient documentation

## 2022-07-30 DIAGNOSIS — S0990XA Unspecified injury of head, initial encounter: Secondary | ICD-10-CM | POA: Diagnosis not present

## 2022-07-30 DIAGNOSIS — Z041 Encounter for examination and observation following transport accident: Secondary | ICD-10-CM | POA: Diagnosis not present

## 2022-07-30 DIAGNOSIS — R0789 Other chest pain: Secondary | ICD-10-CM | POA: Diagnosis not present

## 2022-07-30 DIAGNOSIS — R9431 Abnormal electrocardiogram [ECG] [EKG]: Secondary | ICD-10-CM | POA: Diagnosis not present

## 2022-07-30 DIAGNOSIS — M79604 Pain in right leg: Secondary | ICD-10-CM | POA: Insufficient documentation

## 2022-07-30 DIAGNOSIS — J45909 Unspecified asthma, uncomplicated: Secondary | ICD-10-CM | POA: Insufficient documentation

## 2022-07-30 DIAGNOSIS — M25561 Pain in right knee: Secondary | ICD-10-CM | POA: Diagnosis not present

## 2022-07-30 DIAGNOSIS — M79641 Pain in right hand: Secondary | ICD-10-CM | POA: Insufficient documentation

## 2022-07-30 MED ORDER — METHOCARBAMOL 500 MG PO TABS: 500.0000 mg | ORAL_TABLET | Freq: Two times a day (BID) | ORAL | 0 refills | Status: DC

## 2022-07-30 MED ORDER — METHOCARBAMOL 500 MG PO TABS
500.0000 mg | ORAL_TABLET | Freq: Once | ORAL | Status: AC
  Administered 2022-07-30: 500 mg via ORAL
  Filled 2022-07-30: qty 1

## 2022-07-30 MED ORDER — PROCHLORPERAZINE EDISYLATE 10 MG/2ML IJ SOLN
10.0000 mg | Freq: Once | INTRAMUSCULAR | Status: AC
  Administered 2022-07-30: 10 mg via INTRAVENOUS
  Filled 2022-07-30: qty 2

## 2022-07-30 MED ORDER — DIPHENHYDRAMINE HCL 50 MG/ML IJ SOLN
12.5000 mg | Freq: Once | INTRAMUSCULAR | Status: AC
  Administered 2022-07-30: 12.5 mg via INTRAVENOUS
  Filled 2022-07-30: qty 1
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
## 2022-07-30 NOTE — ED Notes (Signed)
Mary, RN attempted to place IV in R [facility] and was unsuccessful.

## 2022-07-30 NOTE — ED Notes (Signed)
Writer attempted to place IV in left [facility] and was unsuccessful.
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
## 2022-07-31 ENCOUNTER — Other Ambulatory Visit (HOSPITAL_COMMUNITY): Payer: Self-pay

## 2022-07-31 ENCOUNTER — Emergency Department (HOSPITAL_COMMUNITY): Payer: No Typology Code available for payment source

## 2022-07-31 ENCOUNTER — Telehealth: Payer: Self-pay

## 2022-07-31 DIAGNOSIS — S0990XA Unspecified injury of head, initial encounter: Secondary | ICD-10-CM | POA: Diagnosis not present

## 2022-07-31 DIAGNOSIS — S199XXA Unspecified injury of neck, initial encounter: Secondary | ICD-10-CM | POA: Diagnosis not present
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
## 2022-07-31 NOTE — Discharge Instructions (Addendum)
Exam and imaging are all reassuring, I have given you muscle laxer please take as prescribed.   I have also given you a prescription for a muscle relaxer this can make you drowsy do not consume alcohol or operate heavy machinery when taking this medication.   Regards to your headaches are recommend over-the-counter pain medications, please stay hydrated, get plenty of sleep, try to decrease in stress, recommend follow-up with neurology for further evaluation.   Come back to the emergency department if you develop chest pain, shortness of breath, severe abdominal pain, uncontrolled nausea, vomiting, diarrhea.

## 2022-07-31 NOTE — Telephone Encounter (Signed)
Discussed with pt, understanding voiced. 

## 2022-07-31 NOTE — Telephone Encounter (Signed)
Pt has been prescribed Wegovy 0.25mg . It needs a prior authorization.

## 2022-08-01 ENCOUNTER — Other Ambulatory Visit (HOSPITAL_COMMUNITY): Payer: Self-pay

## 2022-08-03 DIAGNOSIS — R21 Rash and other nonspecific skin eruption: Secondary | ICD-10-CM | POA: Diagnosis not present

## 2022-08-03 DIAGNOSIS — E6609 Other obesity due to excess calories: Secondary | ICD-10-CM | POA: Diagnosis not present

## 2022-08-03 DIAGNOSIS — Z3200 Encounter for pregnancy test, result unknown: Secondary | ICD-10-CM | POA: Diagnosis not present

## 2022-08-03 DIAGNOSIS — Z6836 Body mass index (BMI) 36.0-36.9, adult: Secondary | ICD-10-CM | POA: Diagnosis not present

## 2022-08-11 ENCOUNTER — Ambulatory Visit: Admission: RE | Admit: 2022-08-11 | Payer: BLUE CROSS/BLUE SHIELD | Source: Ambulatory Visit

## 2022-08-11 ENCOUNTER — Ambulatory Visit (INDEPENDENT_AMBULATORY_CARE_PROVIDER_SITE_OTHER): Payer: BLUE CROSS/BLUE SHIELD | Admitting: Family Medicine

## 2022-08-11 ENCOUNTER — Encounter: Payer: Self-pay | Admitting: Family Medicine

## 2022-08-11 VITALS — BP 104/90 | HR 77 | Temp 97.8°F | Ht 69.0 in | Wt 260.0 lb

## 2022-08-11 DIAGNOSIS — M79672 Pain in left foot: Secondary | ICD-10-CM

## 2022-08-11 DIAGNOSIS — L309 Dermatitis, unspecified: Secondary | ICD-10-CM | POA: Diagnosis not present

## 2022-08-11 DIAGNOSIS — L853 Xerosis cutis: Secondary | ICD-10-CM | POA: Diagnosis not present

## 2022-08-11 NOTE — Assessment & Plan Note (Addendum)
Will obtain x-ray. May try plantar fasciitis splint as she also endorses heel pain. Encouraged to continue OTC Tylenol, heat, cold, and massage for symptomatic management. Encouraged her to wear shoes with good arch support. Will consider orthopedics referral pending x-ray results.

## 2022-08-11 NOTE — Progress Notes (Signed)
Subjective:  HPI: Janet [name] is a 37 y.o. female presenting on 08/11/2022 for Follow-up (severe pain in both feet; unable to walk well - JBG\\\)   HPI Patient is in today for over 1 year of left foot pain from under her foot to the top of her foot, described as throbbing. Has tried massage, cold, heat, Tylenol without relief. Worse when she is walking and standing. The pain is worse in the AM upon standing. No known injury. Pain is worsening to the point she can barely walk on that foot. She denies numbness, tingling, color change. Endorses some mild swelling as well.  Review of Systems  All other systems reviewed and are negative.   Relevant past medical history reviewed and updated as indicated.   Past Medical History:  Diagnosis Date   Abdominal pain 05/06/2021   Anemia    Asthma    Bilateral knee pain 10/22/2020   Depression    Finger pain, left 11/26/2017   Gestational diabetes mellitus, antepartum 08/12/2016   Moved pt to [facility] GDM program Growth [place] at 38 week EFW 7#13oz, 82nd%   Left wrist pain 06/18/2020   Migraines    Right wrist pain 11/26/2017   Skin tag 05/11/2020     Past Surgical History:  Procedure Laterality Date   CESAREAN SECTION N/A 10/17/2016   Procedure: REPEAT CESAREAN SECTION;  Surgeon: Adam Phenix, MD;  Location: [facility];  Service: Obstetrics;  Laterality: N/A;   CESAREAN SECTION     DILATION AND CURETTAGE OF UTERUS      Allergies and medications reviewed and updated.   Current Outpatient Medications:    albuterol (VENTOLIN HFA) 108 (90 Base) MCG/ACT inhaler, Inhale 2 puffs into the lungs every 6 (six) hours as needed for wheezing or shortness of breath., Disp: 8 g, Rfl: 2   Boric Acid CRYS, Place 600 mg vaginally at bedtime. Use vaginally every night for two weeks then twice a week for six months, Disp: 500 g, Rfl: 5   Etonogestrel (NEXPLANON [place]), Inject into the skin., Disp: , Rfl:    losartan (COZAAR) 50 MG tablet, Take 1  tablet (50 mg total) by mouth at bedtime., Disp: 90 tablet, Rfl: 3   methocarbamol (ROBAXIN) 500 MG tablet, Take 1 tablet (500 mg total) by mouth 2 (two) times daily., Disp: 20 tablet, Rfl: 0   fluconazole (DIFLUCAN) 150 MG tablet, Take one tablet by mouth every three days for three doses, then take one tablet once a week for six months (Patient not taking: Reported on 08/11/2022), Disp: 30 tablet, Rfl: 1   Semaglutide-Weight Management (WEGOVY) 0.25 MG/0.5ML SOAJ, Inject 0.25 mg into the skin once a week. (Patient not taking: Reported on 08/11/2022), Disp: 2 mL, Rfl: 0   Semaglutide-Weight Management (WEGOVY) 0.5 MG/0.5ML SOAJ, Inject 0.5 mg into the skin once a week. (Patient not taking: Reported on 08/11/2022), Disp: 2 mL, Rfl: 0   Semaglutide-Weight Management (WEGOVY) 1 MG/0.5ML SOAJ, Inject 1 mg into the skin once a week. (Patient not taking: Reported on 08/11/2022), Disp: 2 mL, Rfl: 0  Allergies  Allergen Reactions   Flagyl [Metronidazole] Itching   Ibuprofen Swelling    Facial swelling   Nsaids Other (See Comments)    Stomach ulcers   Ondansetron Hcl Other (See Comments)    Pt states "it made me crazy"   Other     Other reaction(s): Other (See Comments) Stomach ulcers   Tramadol Itching   Levofloxacin In D5w Rash and Itching  Objective:   BP (!) 104/90   Pulse 77   Temp 97.8 F (36.6 C) (Oral)   Ht 5\' 9"  (1.753 m)   Wt 260 lb (117.9 kg)   SpO2 99%   BMI 38.40 kg/m      08/11/2022    2:56 PM 07/30/2022   10:50 PM 07/30/2022    5:15 PM  Vitals with BMI  Height 5\' 9"     Weight 260 lbs    BMI 38.38    Systolic 104 146 161  Diastolic 90 107 92  Pulse 77 71 81     Physical Exam Vitals and nursing note reviewed.  Constitutional:      Appearance: Normal appearance. She is normal weight.  HENT:     Head: Normocephalic and atraumatic.  Cardiovascular:     Pulses:          Dorsalis pedis pulses are 2+ on the right side and 2+ on the left side.  Musculoskeletal:      Right foot: Normal.     Left foot: Bony tenderness present.  Feet:     Right foot:     Skin integrity: Skin integrity normal.     Left foot:     Skin integrity: Skin integrity normal.  Skin:    General: Skin is warm and dry.  Neurological:     General: No focal deficit present.     Mental Status: She is alert and oriented to person, place, and time. Mental status is at baseline.  Psychiatric:        Mood and Affect: Mood normal.        Behavior: Behavior normal.        Thought Content: Thought content normal.        Judgment: Judgment normal.     Assessment & Plan:  Left foot pain Assessment & Plan: Will obtain x-ray. May try plantar fasciitis splint as she also endorses heel pain. Encouraged to continue OTC Tylenol, heat, cold, and massage for symptomatic management. Encouraged her to wear shoes with good arch support. Will consider orthopedics referral pending x-ray results.  Orders: -     DG Foot Complete Left; Future  Dermatitis -     Ambulatory referral to Dermatology     Follow up plan: Return if symptoms worsen or fail to improve.  Park Meo, FNP

## 2022-08-13 ENCOUNTER — Telehealth: Payer: Self-pay | Admitting: Family Medicine

## 2022-08-13 ENCOUNTER — Encounter: Payer: Self-pay | Admitting: Family Medicine

## 2022-08-13 NOTE — Telephone Encounter (Signed)
Patient called to request another referral for a sleep study; previous one expired. Prefers a facility in [place].  Patient also wants to know if provider has spoken with her endocrinologist regarding script she requested a refill of.  Please advise at [phone].

## 2022-08-14 ENCOUNTER — Telehealth: Payer: Self-pay

## 2022-08-14 ENCOUNTER — Other Ambulatory Visit: Payer: Self-pay | Admitting: Family Medicine

## 2022-08-14 DIAGNOSIS — Z419 Encounter for procedure for purposes other than remedying health state, unspecified: Secondary | ICD-10-CM | POA: Diagnosis not present

## 2022-08-14 MED ORDER — DICLOFENAC SODIUM 1 % EX GEL: 2.0000 g | Freq: Every day | CUTANEOUS | 1 refills | Status: DC

## 2022-08-14 NOTE — Telephone Encounter (Signed)
LVM for pt to return call re referral for sleep study?

## 2022-08-14 NOTE — Telephone Encounter (Signed)
Spoke w/pt and told pt that her referral to sleep study should have not expired yet, gave her [facility] in [place] number to pt. Per pt will call them and try to make an appt. Well call back if need to.   Nothing else.

## 2022-08-15 ENCOUNTER — Other Ambulatory Visit (HOSPITAL_COMMUNITY): Payer: Self-pay

## 2022-09-01 DIAGNOSIS — R002 Palpitations: Secondary | ICD-10-CM | POA: Diagnosis not present

## 2022-09-01 DIAGNOSIS — R5383 Other fatigue: Secondary | ICD-10-CM | POA: Diagnosis not present

## 2022-09-01 DIAGNOSIS — E559 Vitamin D deficiency, unspecified: Secondary | ICD-10-CM | POA: Diagnosis not present

## 2022-09-01 DIAGNOSIS — R232 Flushing: Secondary | ICD-10-CM | POA: Diagnosis not present

## 2022-09-01 DIAGNOSIS — R4586 Emotional lability: Secondary | ICD-10-CM | POA: Diagnosis not present

## 2022-09-01 DIAGNOSIS — N951 Menopausal and female climacteric states: Secondary | ICD-10-CM | POA: Diagnosis not present

## 2022-09-01 DIAGNOSIS — N941 Unspecified dyspareunia: Secondary | ICD-10-CM | POA: Diagnosis not present

## 2022-09-01 DIAGNOSIS — Z6837 Body mass index (BMI) 37.0-37.9, adult: Secondary | ICD-10-CM | POA: Diagnosis not present

## 2022-09-01 DIAGNOSIS — I10 Essential (primary) hypertension: Secondary | ICD-10-CM | POA: Diagnosis not present

## 2022-09-01 DIAGNOSIS — R7309 Other abnormal glucose: Secondary | ICD-10-CM | POA: Diagnosis not present

## 2022-09-01 DIAGNOSIS — Z136 Encounter for screening for cardiovascular disorders: Secondary | ICD-10-CM | POA: Diagnosis not present

## 2022-09-01 DIAGNOSIS — J45909 Unspecified asthma, uncomplicated: Secondary | ICD-10-CM | POA: Diagnosis not present

## 2022-09-01 DIAGNOSIS — Z130 Encounter for screening for diseases of the blood and blood-forming organs and certain disorders involving the immune mechanism: Secondary | ICD-10-CM | POA: Diagnosis not present

## 2022-09-01 DIAGNOSIS — Z1329 Encounter for screening for other suspected endocrine disorder: Secondary | ICD-10-CM | POA: Diagnosis not present

## 2022-09-05 ENCOUNTER — Telehealth: Payer: Self-pay | Admitting: Family Medicine

## 2022-09-05 NOTE — Telephone Encounter (Signed)
Patient called to follow up on x-ray and to report ongoing pain which has shifted to her knee making it very hard for her to walk. Requesting call back.  Please advise at [phone].

## 2022-09-08 NOTE — Telephone Encounter (Signed)
Spoke w/pt re xray results. Per pt stated that her swelling on her feet has not gotten better, instead it has gotten worst. Now she is having issues with her knee as well to where she can't even squat.   Pt would like referral to Ortho plus pt also want something for her situation. Pls advise?

## 2022-09-08 NOTE — Telephone Encounter (Signed)
Attempted to call pt. NO Answer. LVM for pt to call back, re xray results/[facility] notes.

## 2022-09-09 ENCOUNTER — Other Ambulatory Visit: Payer: Self-pay | Admitting: Family Medicine

## 2022-09-09 DIAGNOSIS — M79672 Pain in left foot: Secondary | ICD-10-CM

## 2022-09-11 ENCOUNTER — Telehealth: Payer: Self-pay | Admitting: Family Medicine

## 2022-09-11 NOTE — Telephone Encounter (Signed)
Patient called to advise provider she tested negative for COVID using an at-home test. Patient requesting letter stating she doesn't have COVID to return to work; scheduled to go in today.  Please send note to patient via MyChart.  Please advise patient with questions at [phone].

## 2022-09-14 DIAGNOSIS — Z419 Encounter for procedure for purposes other than remedying health state, unspecified: Secondary | ICD-10-CM | POA: Diagnosis not present

## 2022-09-18 ENCOUNTER — Other Ambulatory Visit (HOSPITAL_COMMUNITY): Payer: Self-pay

## 2022-09-18 DIAGNOSIS — E669 Obesity, unspecified: Secondary | ICD-10-CM | POA: Diagnosis not present

## 2022-09-18 DIAGNOSIS — E559 Vitamin D deficiency, unspecified: Secondary | ICD-10-CM | POA: Diagnosis not present

## 2022-09-18 DIAGNOSIS — Z6837 Body mass index (BMI) 37.0-37.9, adult: Secondary | ICD-10-CM | POA: Diagnosis not present

## 2022-09-18 DIAGNOSIS — R635 Abnormal weight gain: Secondary | ICD-10-CM | POA: Diagnosis not present

## 2022-09-18 DIAGNOSIS — Z1339 Encounter for screening examination for other mental health and behavioral disorders: Secondary | ICD-10-CM | POA: Diagnosis not present

## 2022-09-18 DIAGNOSIS — N951 Menopausal and female climacteric states: Secondary | ICD-10-CM | POA: Diagnosis not present

## 2022-09-18 DIAGNOSIS — Z1331 Encounter for screening for depression: Secondary | ICD-10-CM | POA: Diagnosis not present

## 2022-09-18 MED ORDER — PROGESTERONE MICRONIZED 100 MG PO CAPS
100.0000 mg | ORAL_CAPSULE | Freq: Every day | ORAL | 1 refills | Status: DC
  Filled 2022-09-18 – 2022-11-04 (×4): qty 30, 30d supply, fill #0
  Filled 2022-11-29: qty 30, 30d supply, fill #1

## 2022-09-18 MED ORDER — WEGOVY 0.25 MG/0.5ML ~~LOC~~ SOAJ
0.2500 mg | SUBCUTANEOUS | 0 refills | Status: DC
Start: 2022-09-18 — End: 2023-03-10
  Filled 2022-09-18 – 2022-11-04 (×7): qty 2, 28d supply, fill #0

## 2022-09-23 ENCOUNTER — Other Ambulatory Visit (HOSPITAL_COMMUNITY): Payer: Self-pay

## 2022-09-24 ENCOUNTER — Encounter: Payer: Self-pay | Admitting: Neurology

## 2022-09-24 ENCOUNTER — Ambulatory Visit (INDEPENDENT_AMBULATORY_CARE_PROVIDER_SITE_OTHER): Payer: Medicaid Other | Admitting: Neurology

## 2022-09-24 VITALS — BP 141/92 | HR 69 | Ht 69.0 in | Wt 255.0 lb

## 2022-09-24 DIAGNOSIS — R0681 Apnea, not elsewhere classified: Secondary | ICD-10-CM | POA: Diagnosis not present

## 2022-09-24 DIAGNOSIS — G4719 Other hypersomnia: Secondary | ICD-10-CM | POA: Diagnosis not present

## 2022-09-24 DIAGNOSIS — R0689 Other abnormalities of breathing: Secondary | ICD-10-CM

## 2022-09-24 DIAGNOSIS — Z9189 Other specified personal risk factors, not elsewhere classified: Secondary | ICD-10-CM

## 2022-09-24 DIAGNOSIS — R0683 Snoring: Secondary | ICD-10-CM

## 2022-09-24 DIAGNOSIS — E669 Obesity, unspecified: Secondary | ICD-10-CM

## 2022-09-24 DIAGNOSIS — R519 Headache, unspecified: Secondary | ICD-10-CM | POA: Diagnosis not present

## 2022-09-24 DIAGNOSIS — R351 Nocturia: Secondary | ICD-10-CM | POA: Diagnosis not present

## 2022-09-24 DIAGNOSIS — R002 Palpitations: Secondary | ICD-10-CM

## 2022-09-24 DIAGNOSIS — R030 Elevated blood-pressure reading, without diagnosis of hypertension: Secondary | ICD-10-CM

## 2022-09-24 NOTE — Progress Notes (Signed)
Subjective:    Janet [name] ID: Janet Janet [name] is a 37 y.o. female.  HPI    Janet Foley, MD, PhD Janet [name] [address], [address] [address] [place], [place] [address]  Dear Janet Janet [name],  I saw your Janet [name], Janet Janet [name], upon your kind request in my sleep clinic today for initial consultation of her sleep disorder, in particular, concern for underlying obstructive sleep apnea.  Janet Janet [name] is unaccompanied today.  As you know, Janet Janet [name] is a 37 year old female with an underlying medical history of hypertension, asthma, anemia, migraine headaches, depression, knee pain, gestational diabetes, and obesity, who reports snoring and excessive daytime somnolence.  Her Epworth sleepiness score is 15 out of 24, fatigue severity score is 61 out of 63.  She reports that this morning she had a brief, few second episode of confusion while driving.  It resolved on its own, no headache, did not feel sleepy at Janet time.  She is advised to seek medical attention if this were to happen again.  I reviewed your office note from 04/09/2022.  She has had slow weight gain over Janet past 5 years.  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 She has a degree in psychology from [place] and is going back to school to become a Designer, jewellery and ultimately would like to work as a Scientist, clinical (histocompatibility and immunogenetics).  She lives at home with her husband, 62 year old twin boys and 24-year-old daughter.  She is not aware of any family history of sleep apnea.  She reports snoring which has become worse and apneas per husband's observation and also waking up with a sense of gasping for air.  She has had palpitations.  She has had occasional morning headaches and has nocturia about once or twice per average night.  She works second shift currently but her contract is phasing out.  She works from 3-11.  Bedtime is around midnight or 12:30 AM and rise time around 6:30 AM.  She does not drink caffeine daily due to her migraines.  She drinks alcohol very  rarely if any, she is a non-smoker.  She does have a TV in her bedroom but it is typically not on at night, rarely on at all.  Her Past Medical History Is Significant For: Past Medical History:  Diagnosis Date   Abdominal pain 05/06/2021   Anemia    Asthma    Bilateral knee pain 10/22/2020   Depression    Finger pain, left 11/26/2017   Gestational diabetes mellitus, antepartum 08/12/2016   Moved pt to BRX GDM program Growth [place] at 38 week EFW 7#13oz, 82nd%   Left wrist pain 06/18/2020   Migraines    Right wrist pain 11/26/2017   Skin tag 05/11/2020    Her Past Surgical History Is Significant For: Past Surgical History:  Procedure Laterality Date   CESAREAN SECTION N/A 10/17/2016   Procedure: REPEAT CESAREAN SECTION;  Surgeon: Janet Phenix, MD;  Location: [facility] BIRTHING SUITES;  Service: Obstetrics;  Laterality: N/A;   CESAREAN SECTION     DILATION AND CURETTAGE OF UTERUS      Her Family History Is Significant For: Family History  Problem Relation Age of Onset   Hypertension Mother    Heart disease Mother    Diabetes Father    Breast cancer Maternal Aunt 33   Sleep apnea Neg Hx     Her Social History Is Significant For: Social History   Socioeconomic History   Marital status: Married    Spouse  name: Janet Janet [name]   Number of children: 3   Years of education: college   Highest education level: Not on file  Occupational History   Not on file  Tobacco Use   Smoking status: Never   Smokeless tobacco: Never  Vaping Use   Vaping status: Never Used  Substance and Sexual Activity   Alcohol use: Not Currently    Comment: once every few months   Drug use: No   Sexual activity: Yes    Partners: Male    Birth control/protection: Implant  Other Topics Concern   Not on file  Social History Narrative   04/06/19   From: [name], moved to [place] in 2016   Living: with Janet Janet [name] (husband since 2017)   Work: stay at home mom      Family: Janet Janet [name] and Special educational needs teacher (twins 2010) and  Janet Janet [name] (2019), and 3 step children - Janet Janet [name], Janet [name], Janet Janet [name]      Enjoys: sing professionally, reading      Exercise: goes to Janet gym when she can - 1-2 times a week   Diet: keto diet      Safety   Seat belts: Yes    Guns: No   Safe in relationships: Yes    Social Determinants of Corporate investment banker Strain: Not on file  Food Insecurity: Not on file  Transportation Needs: Not on file  Physical Activity: Not on file  Stress: Not on file  Social Connections: Not on file    Her Allergies Are:  Allergies  Allergen Reactions   Flagyl [Metronidazole] Itching   Ibuprofen Swelling    Facial swelling   Nsaids Other (See Comments)    Stomach ulcers   Ondansetron Hcl Other (See Comments)    Pt states "it made me crazy"   Other     Other reaction(s): Other (See Comments) Stomach ulcers   Tramadol Itching   Levofloxacin In D5w Rash and Itching  :   Her Current Medications Are:  Outpatient Encounter Medications as of 09/24/2022  Medication Sig   albuterol (VENTOLIN HFA) 108 (90 Base) MCG/ACT inhaler Inhale 2 puffs into Janet lungs every 6 (six) hours as needed for wheezing or shortness of breath.   Boric Acid CRYS Place 600 mg vaginally at bedtime. Use vaginally every night for two weeks then twice a week for six months   diclofenac Sodium (VOLTAREN ARTHRITIS PAIN) 1 % GEL Apply 2 g topically daily.   Etonogestrel (NEXPLANON Panacea) Inject into Janet skin.   fluconazole (DIFLUCAN) 150 MG tablet Take one tablet by mouth every three days for three doses, then take one tablet once a week for six months   losartan (COZAAR) 50 MG tablet Take 1 tablet (50 mg total) by mouth at bedtime.   progesterone (PROMETRIUM) 100 MG capsule Take 1 capsule (100 mg total) by mouth at bedtime.   methocarbamol (ROBAXIN) 500 MG tablet Take 1 tablet (500 mg total) by mouth 2 (two) times daily.   Semaglutide-Weight Management (WEGOVY) 0.25 MG/0.5ML SOAJ Inject 0.25 mg into Janet skin once a week.    Semaglutide-Weight Management (WEGOVY) 0.25 MG/0.5ML SOAJ Inject 0.25 mg into Janet skin once a week.   Semaglutide-Weight Management (WEGOVY) 0.5 MG/0.5ML SOAJ Inject 0.5 mg into Janet skin once a week.   Semaglutide-Weight Management (WEGOVY) 1 MG/0.5ML SOAJ Inject 1 mg into Janet skin once a week.   No facility-administered encounter medications on file as of 09/24/2022.  :   Review of Systems:  Out of a complete  14 point review of systems, all are reviewed and negative with Janet exception of these symptoms as listed below:   Review of Systems  Neurological:        Pt here for sleep consult Pt snores,fatigue,headaches,hypertension Pt denies sleep study,CPAP machine       Objective:  Neurological Exam  Physical Exam Physical Examination:   Vitals:   09/24/22 0833  BP: (!) 141/92  Pulse: 69    General Examination: Janet Janet [name] is a very pleasant 37 y.o. female in no acute distress. She appears well-developed and well-nourished and well groomed.  She took her BP med in Janet evening. HEENT: Normocephalic, atraumatic, pupils are equal, round and reactive to light, extraocular tracking is good without limitation to gaze excursion or nystagmus noted. Hearing is grossly intact. Face is symmetric with normal facial animation. Speech is clear with no dysarthria noted. There is no hypophonia. There is no lip, neck/head, jaw or voice tremor. Neck is supple with full range of passive and active motion. There are no carotid bruits on auscultation. Oropharynx exam reveals: no significant mouth dryness, good dental hygiene and moderate airway crowding, due to tonsillar size of 1-2+ on Janet right, 1+ on Janet left, larger uvula, wider tongue.  Mallampati is class II.  Neck circumference is 15-3/8 inches.  Minimal overbite noted.  Tongue protrudes centrally and palate elevates symmetrically.  Chest: Clear to auscultation without wheezing, rhonchi or crackles noted.  Heart: S1+S2+0, regular and normal  without murmurs, rubs or gallops noted.   Abdomen: Soft, non-tender and non-distended.  Extremities: There is trace pitting edema in Janet distal lower extremities bilaterally.   Skin: Warm and dry without trophic changes noted.   Musculoskeletal: exam reveals no obvious joint deformities.   Neurologically:  Mental status: Janet Janet [name] is awake, alert and oriented in all 4 spheres. Her immediate and remote memory, attention, language skills and fund of knowledge are appropriate. There is no evidence of aphasia, agnosia, apraxia or anomia. Speech is clear with normal prosody and enunciation. Thought process is linear. Mood is normal and affect is normal.  Cranial nerves II - XII are as described above under HEENT exam.  Motor exam: Normal bulk, strength and tone is noted. There is no obvious action or resting tremor.  Fine motor skills and coordination: grossly intact.  Cerebellar testing: No dysmetria or intention tremor. There is no truncal or gait ataxia.  Sensory exam: intact to light touch in Janet upper and lower extremities.  Gait, station and balance: She stands easily. No veering to one side is noted. No leaning to one side is noted. Posture is age-appropriate and stance is narrow based. Gait shows normal stride length and normal pace. No problems turning are noted.   Assessment and Plan:  In summary, Janet Janet [name] is a very pleasant 37 y.o.-year old female with an underlying medical history of hypertension, asthma, anemia, migraine headaches, depression, knee pain, gestational diabetes, and obesity, whose history and physical exam are concerning for sleep disordered breathing, particularly obstructive sleep apnea (OSA). A laboratory attended sleep study is typically considered "gold standard" for evaluation of sleep disordered breathing.   I had a long chat with Janet Janet [name] about my findings and Janet diagnosis of sleep apnea, particularly OSA, its prognosis and treatment options. We talked  about medical/conservative treatments, surgical interventions and non-pharmacological approaches for symptom control. I explained, in particular, Janet risks and ramifications of untreated moderate to severe OSA, especially with respect to developing cardiovascular disease down Janet road,  including congestive heart failure (CHF), difficult to treat hypertension, cardiac arrhythmias (particularly A-fib), neurovascular complications including TIA, stroke and dementia. Even type 2 diabetes has, in part, been linked to untreated OSA. Symptoms of untreated OSA may include (but may not be limited to) daytime sleepiness, nocturia (i.e. frequent nighttime urination), memory problems, mood irritability and suboptimally controlled or worsening mood disorder such as depression and/or anxiety, lack of energy, lack of motivation, physical discomfort, as well as recurrent headaches, especially morning or nocturnal headaches. We talked about Janet importance of maintaining a healthy lifestyle and striving for healthy weight. In addition, we talked about Janet importance of striving for and maintaining good sleep hygiene, in particular, to make enough time for sleep. I recommended a sleep study at this time. I outlined Janet differences between a laboratory attended sleep study which is considered more comprehensive and accurate over Janet option of a home sleep test (HST); Janet latter may lead to underestimation of sleep disordered breathing in some instances and does not help with diagnosing upper airway resistance syndrome and is not accurate enough to diagnose primary central sleep apnea typically. I outlined possible surgical and non-surgical treatment options of OSA, including Janet use of a positive airway pressure (PAP) device (i.e. CPAP, AutoPAP/APAP or BiPAP in certain circumstances), a custom-made dental device (aka oral appliance, which would require a referral to a specialist dentist or orthodontist typically, and is generally  speaking not considered for patients with full dentures or edentulous state), upper airway surgical options, such as traditional UPPP (which is not considered a first-line treatment) or Janet Inspire device (hypoglossal nerve stimulator, which would involve a referral for consultation with an ENT surgeon, after careful selection, following inclusion criteria - also not first-line treatment). I explained Janet PAP treatment option to Janet Janet [name] in detail, as this is generally considered first-line treatment.  Janet Janet [name] indicated that she would be willing to try PAP therapy, if Janet need arises.  She is worried about being able to tolerate treatment.  I explained Janet importance of being compliant with PAP treatment, not only for insurance purposes but primarily to improve Janet [name]'s symptoms symptoms, and for Janet Janet [name]'s long term health benefit, including to reduce Her cardiovascular risks longer-term.    We will pick up our discussion about Janet next steps and treatment options after testing.  We will keep her posted as to Janet test results by phone call and/or MyChart messaging where possible.  We will plan to follow-up in sleep clinic accordingly as well.  I answered all her questions today and Janet Janet [name] was in agreement.   I encouraged her to call with any interim questions, concerns, problems or updates or email [place] through MyChart.  Generally speaking, sleep test authorizations may take up to 2 weeks, sometimes less, sometimes longer, Janet Janet [name] is encouraged to get in touch with [place] if they do not hear back from Janet sleep lab staff directly within Janet next 2 weeks.  Thank you very much for allowing me to participate in Janet care of this nice Janet [name]. If I can be of any further assistance to you please do not hesitate to call me at [phone].  Sincerely,   Janet Foley, MD, PhD

## 2022-09-24 NOTE — Patient Instructions (Signed)

## 2022-09-25 ENCOUNTER — Other Ambulatory Visit: Payer: Self-pay

## 2022-09-25 ENCOUNTER — Telehealth: Payer: Self-pay | Admitting: Neurology

## 2022-09-25 NOTE — Telephone Encounter (Signed)
NPSG- MCD wellcare pending uploaded notes.

## 2022-09-30 ENCOUNTER — Other Ambulatory Visit (HOSPITAL_COMMUNITY): Payer: Self-pay

## 2022-10-01 NOTE — Telephone Encounter (Signed)
Checked status on the portal it is still pending.

## 2022-10-02 ENCOUNTER — Ambulatory Visit (INDEPENDENT_AMBULATORY_CARE_PROVIDER_SITE_OTHER): Payer: Medicaid Other | Admitting: Orthopedic Surgery

## 2022-10-02 ENCOUNTER — Other Ambulatory Visit (INDEPENDENT_AMBULATORY_CARE_PROVIDER_SITE_OTHER): Payer: Medicaid Other

## 2022-10-02 DIAGNOSIS — M79672 Pain in left foot: Secondary | ICD-10-CM | POA: Diagnosis not present

## 2022-10-02 DIAGNOSIS — G8929 Other chronic pain: Secondary | ICD-10-CM

## 2022-10-02 DIAGNOSIS — M25562 Pain in left knee: Secondary | ICD-10-CM

## 2022-10-06 ENCOUNTER — Encounter: Payer: Self-pay | Admitting: Orthopedic Surgery

## 2022-10-06 DIAGNOSIS — M79672 Pain in left foot: Secondary | ICD-10-CM | POA: Diagnosis not present

## 2022-10-06 DIAGNOSIS — M25562 Pain in left knee: Secondary | ICD-10-CM | POA: Diagnosis not present

## 2022-10-06 DIAGNOSIS — G8929 Other chronic pain: Secondary | ICD-10-CM | POA: Diagnosis not present

## 2022-10-06 MED ORDER — METHYLPREDNISOLONE ACETATE 40 MG/ML IJ SUSP
40.0000 mg | INTRAMUSCULAR | Status: AC | PRN
Start: 2022-10-06 — End: 2022-10-06
  Administered 2022-10-06: 40 mg via INTRA_ARTICULAR

## 2022-10-06 MED ORDER — LIDOCAINE HCL (PF) 1 % IJ SOLN
5.0000 mL | INTRAMUSCULAR | Status: AC | PRN
Start: 2022-10-06 — End: 2022-10-06
  Administered 2022-10-06: 5 mL

## 2022-10-06 NOTE — Progress Notes (Signed)
Office Visit Note   Patient: Janet [name]           Date of Birth: Nov 03, 1985           MRN: [id number] Visit Date: 10/02/2022              Requested by: Park Meo, FNP [address] [place],  [place] [address] PCP: Park Meo, FNP  Chief Complaint  Patient presents with   Left Foot - Pain      HPI: Patient is a 37 year old woman who is seen for initial evaluation for left foot and left knee pain.  Patient states she has had left knee pain on and off for 4 years.  She states she has episodes of swelling start up stiffness and pain with range of motion and difficulty squatting.  Assessment & Plan: Visit Diagnoses:  1. Pain in left foot   2. Chronic pain of left knee     Plan: Recommend a carbon plate to unload the base of the fourth and fifth metatarsals.  The left knee was injected recommended strengthening.  Follow-Up Instructions: Return if symptoms worsen or fail to improve.   Ortho Exam  Patient is alert, oriented, no adenopathy, well-dressed, normal affect, normal respiratory effort. Examination of the left foot she has good pulses good ankle good subtalar motion.  The tendons are not tender to palpation there are no nodules.  No skin abnormalities.  She is tender to palpation over the base of the fourth and fifth metatarsals.  Examination of the left knee she has crepitation with range of motion patellofemoral joint.  The medial lateral  patella is tender to palpation.  Collaterals and cruciates are stable.  No mechanical symptoms with range of motion.  Imaging: No results found. No images are attached to the encounter.  Labs: Lab Results  Component Value Date   HGBA1C 5.2 04/09/2022   HGBA1C 5.3 05/06/2021   HGBA1C 5.4 06/18/2020   ESRSEDRATE 2 10/22/2020   ESRSEDRATE 2 09/05/2020   CRP 1.3 10/22/2020   CRP 2 09/05/2020   LABURIC 4.9 06/18/2020   REPTSTATUS 08/19/2021 FINAL 08/14/2021   CULT  08/14/2021    NO GROWTH 5 DAYS Performed at  [facility], [address] [place], [place] [address]      Lab Results  Component Value Date   ALBUMIN 4.6 09/02/2021   ALBUMIN 4.3 08/20/2021   ALBUMIN 3.0 (L) 08/17/2021    Lab Results  Component Value Date   MG 1.8 08/20/2021   MG 1.9 08/17/2021   MG 2.2 08/16/2021   Lab Results  Component Value Date   VD25OH 28 (L) 04/09/2022   VD25OH 21.3 (L) 04/24/2020   VD25OH 18.8 (L) 12/20/2019    No results found for: "PREALBUMIN"    Latest Ref Rng & Units 04/09/2022   10:06 AM 09/02/2021   10:50 AM 09/02/2021   10:48 AM  CBC EXTENDED  WBC 3.8 - 10.8 Thousand/uL 4.1  4.6  5.0   RBC 3.80 - 5.10 Million/uL 4.51  4.77  4.70   Hemoglobin 11.7 - 15.5 g/dL 86.5  78.4  69.6   HCT 35.0 - 45.0 % 38.8  40.9  39.8   Platelets 140 - 400 Thousand/uL 221  287  280   NEUT# 1,500 - 7,800 cells/uL 1,402  2.2  2.3   Lymph# 850 - 3,900 cells/uL 2,218  2.0  2.2      There is no height or weight on  file to calculate BMI.  Orders:  Orders Placed This Encounter  Procedures   XR Foot 2 Views Left   XR Knee 1-2 Views Left   No orders of the defined types were placed in this encounter.    Procedures: Large Joint Inj: L knee on 10/06/2022 7:39 AM Indications: pain and diagnostic evaluation Details: 22 G 1.5 in needle, anteromedial approach  Arthrogram: No  Medications: 5 mL lidocaine (PF) 1 %; 40 mg methylPREDNISolone acetate 40 MG/ML Outcome: tolerated well, no immediate complications Procedure, treatment alternatives, risks and benefits explained, specific risks discussed. Consent was given by the patient. Immediately prior to procedure a time out was called to verify the correct patient, procedure, equipment, support staff and site/side marked as required. Patient was prepped and draped in the usual sterile fashion.      Clinical Data: No additional findings.  ROS:  All other systems negative, except as noted in the HPI. Review of Systems  Objective: Vital Signs:  There were no vitals taken for this visit.  Specialty Comments:  No specialty comments available.  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   Essential hypertension, benign 04/24/2020   Depression, recurrent (HCC) 12/20/2019   Vitamin D deficiency 12/20/2019   Insomnia 12/20/2019   Tension headache 06/15/2019   Current moderate episode of major depressive disorder without prior episode (HCC) 04/06/2019   Psychophysiological insomnia 04/06/2019   Chronic bilateral low back pain without sciatica 11/26/2017   Contracture of finger joint, 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   Benign paroxysmal positional vertigo 02/14/2017   Obesity, Class II, BMI 35-39.9 06/27/2016   Sickle cell trait (HCC) 05/15/2016   History of gestational hypertension 04/24/2016   Class 2 severe obesity due to excess calories with serious comorbidity and body mass index (BMI) of 37.0 to 37.9 in adult (HCC) 04/24/2016   Past Medical History:  Diagnosis Date   Abdominal pain 05/06/2021   Anemia    Asthma    Bilateral knee pain 10/22/2020   Depression    Finger pain, left 11/26/2017   Gestational diabetes mellitus, antepartum 08/12/2016   Moved pt to BRX GDM program Growth [place] at 38 week EFW 7#13oz, 82nd%   Left wrist pain 06/18/2020   Migraines    Right wrist pain 11/26/2017   Skin tag 05/11/2020    Family History  Problem Relation Age of Onset   Hypertension Mother    Heart disease Mother    Diabetes Father    Breast cancer  Maternal Aunt 45   Sleep apnea Neg Hx     Past Surgical History:  Procedure Laterality Date   CESAREAN SECTION N/A 10/17/2016   Procedure: REPEAT CESAREAN SECTION;  Surgeon: Adam Phenix, MD;  Location: [facility] BIRTHING SUITES;  Service: Obstetrics;  Laterality: N/A;   CESAREAN SECTION     DILATION AND CURETTAGE OF UTERUS     Social History   Occupational History   Not on file  Tobacco Use   Smoking status: Never   Smokeless tobacco: Never  Vaping Use   Vaping status: Never Used  Substance and Sexual Activity   Alcohol use: Not Currently    Comment: once every few months   Drug use: No   Sexual activity: Yes    Partners: Male    Birth control/protection: Implant

## 2022-10-06 NOTE — Telephone Encounter (Signed)
NPSG- MCD wellcare Berkley Harvey: [id number] (exp. 09/25/22 to 12/30/22)

## 2022-10-14 DIAGNOSIS — Z419 Encounter for procedure for purposes other than remedying health state, unspecified: Secondary | ICD-10-CM | POA: Diagnosis not present

## 2022-10-15 ENCOUNTER — Other Ambulatory Visit (HOSPITAL_COMMUNITY): Payer: Self-pay

## 2022-10-20 ENCOUNTER — Other Ambulatory Visit (HOSPITAL_COMMUNITY): Payer: Self-pay

## 2022-10-20 DIAGNOSIS — E669 Obesity, unspecified: Secondary | ICD-10-CM | POA: Diagnosis not present

## 2022-10-20 DIAGNOSIS — I10 Essential (primary) hypertension: Secondary | ICD-10-CM | POA: Diagnosis not present

## 2022-10-20 DIAGNOSIS — N951 Menopausal and female climacteric states: Secondary | ICD-10-CM | POA: Diagnosis not present

## 2022-10-20 MED ORDER — WEGOVY 0.25 MG/0.5ML ~~LOC~~ SOAJ
0.2500 mg | SUBCUTANEOUS | 0 refills | Status: DC
Start: 2022-10-20 — End: 2023-03-10

## 2022-10-21 ENCOUNTER — Ambulatory Visit: Payer: Medicaid Other

## 2022-10-21 DIAGNOSIS — Z23 Encounter for immunization: Secondary | ICD-10-CM | POA: Diagnosis not present

## 2022-10-24 ENCOUNTER — Other Ambulatory Visit (HOSPITAL_COMMUNITY): Payer: Self-pay

## 2022-10-28 ENCOUNTER — Other Ambulatory Visit (HOSPITAL_COMMUNITY): Payer: Self-pay

## 2022-10-28 ENCOUNTER — Ambulatory Visit: Payer: Medicaid Other | Admitting: Family Medicine

## 2022-10-28 NOTE — Telephone Encounter (Signed)
Was this addressed? See my other phone from 7/18. Patient is irate.

## 2022-10-28 NOTE — Telephone Encounter (Signed)
Pt is upset because she has not been able to get the medication named Wegovy. I advised her not covered by insurance per note on 7/18 and that she spoke with the nurse. She said she has wellcare not [employer]. Can you verify which insurance you did the PA with as I show medicaid in the chart

## 2022-10-29 ENCOUNTER — Other Ambulatory Visit (HOSPITAL_COMMUNITY): Payer: Self-pay

## 2022-10-29 ENCOUNTER — Ambulatory Visit: Payer: Medicaid Other | Admitting: "Endocrinology

## 2022-10-30 ENCOUNTER — Telehealth: Payer: Self-pay

## 2022-10-30 ENCOUNTER — Other Ambulatory Visit (HOSPITAL_COMMUNITY): Payer: Self-pay

## 2022-10-30 ENCOUNTER — Other Ambulatory Visit (HOSPITAL_BASED_OUTPATIENT_CLINIC_OR_DEPARTMENT_OTHER): Payer: Self-pay

## 2022-10-30 NOTE — Telephone Encounter (Signed)
Pharmacy Patient Advocate Encounter   Received notification from Pt Calls Messages that prior authorization for [facility] is required/requested.   Insurance verification completed.   The patient is insured through [facility] .   Per test claim: PA required; PA submitted to [facility] via CoverMyMeds Key/confirmation #/EOC [id number] Status is pending

## 2022-11-04 ENCOUNTER — Other Ambulatory Visit (HOSPITAL_COMMUNITY): Payer: Self-pay

## 2022-11-04 ENCOUNTER — Other Ambulatory Visit (HOSPITAL_BASED_OUTPATIENT_CLINIC_OR_DEPARTMENT_OTHER): Payer: Self-pay

## 2022-11-06 ENCOUNTER — Encounter: Payer: Self-pay | Admitting: Family Medicine

## 2022-11-06 ENCOUNTER — Ambulatory Visit: Payer: Medicaid Other | Admitting: Family Medicine

## 2022-11-06 VITALS — BP 138/100 | HR 73 | Temp 98.6°F | Ht 69.0 in | Wt 259.0 lb

## 2022-11-06 DIAGNOSIS — Z111 Encounter for screening for respiratory tuberculosis: Secondary | ICD-10-CM | POA: Diagnosis not present

## 2022-11-06 DIAGNOSIS — N898 Other specified noninflammatory disorders of vagina: Secondary | ICD-10-CM | POA: Insufficient documentation

## 2022-11-06 DIAGNOSIS — I10 Essential (primary) hypertension: Secondary | ICD-10-CM | POA: Diagnosis not present

## 2022-11-06 DIAGNOSIS — R300 Dysuria: Secondary | ICD-10-CM | POA: Diagnosis not present

## 2022-11-06 DIAGNOSIS — L309 Dermatitis, unspecified: Secondary | ICD-10-CM | POA: Diagnosis not present

## 2022-11-06 LAB — WET PREP FOR TRICH, YEAST, CLUE

## 2022-11-06 LAB — URINALYSIS, ROUTINE W REFLEX MICROSCOPIC
Bacteria, UA: NONE SEEN /[HPF]
Bilirubin Urine: NEGATIVE
Glucose, UA: NEGATIVE
Hyaline Cast: NONE SEEN /[LPF]
Ketones, ur: NEGATIVE
Leukocytes,Ua: NEGATIVE
Nitrite: NEGATIVE
Specific Gravity, Urine: 1.025 (ref 1.001–1.035)
WBC, UA: NONE SEEN /[HPF] (ref 0–5)
pH: 6 (ref 5.0–8.0)

## 2022-11-06 LAB — MICROSCOPIC MESSAGE

## 2022-11-06 MED ORDER — KETOCONAZOLE 2 % EX CREA: 1.0000 | TOPICAL_CREAM | Freq: Two times a day (BID) | CUTANEOUS | 3 refills | Status: DC

## 2022-11-06 MED ORDER — LOSARTAN POTASSIUM 100 MG PO TABS
100.0000 mg | ORAL_TABLET | Freq: Every day | ORAL | 1 refills | Status: DC
Start: 2022-11-06 — End: 2022-11-24

## 2022-11-06 MED ORDER — TRIAMCINOLONE ACETONIDE 0.025 % EX OINT: 1.0000 | TOPICAL_OINTMENT | Freq: Two times a day (BID) | CUTANEOUS | 0 refills | Status: DC

## 2022-11-06 NOTE — Assessment & Plan Note (Signed)
UA negative

## 2022-11-06 NOTE — Assessment & Plan Note (Signed)
Requests refills of dermatologist recommended Ketoconazole and Triamcinolone creams

## 2022-11-06 NOTE — Progress Notes (Signed)
Subjective:  HPI: Janet [name] is a 37 y.o. female presenting on 11/06/2022 for Follow-up (having some discharge/uncomfortable feeling//)   HPI Patient is in today for recurrent vaginal odor with dysuria and lower abdominal pain. This has been ongoing for one year, she has been evaluated by her GYN. She denies vaginal discharge, rash, itching. Her menstrual cycle is irregular at baseline and unchanged. No hematuria, urinary frequency or urgency. She denies being at risk for STDs and declines STD testing. Has tried 6 months of oral fluconazole as well as boric acid per gynecology. She also reports today that her BP has been elevated for several weeks around 150/100-110s at home and endorses recurrent headaches. Denies chest pain, palpitations, shortness of breath, vision changes, lightheadedness, dizziness, swelling of extremities.  Review of Systems  All other systems reviewed and are negative.   Relevant past medical history reviewed and updated as indicated.   Past Medical History:  Diagnosis Date   Abdominal pain 05/06/2021   Anemia    Asthma    Bilateral knee pain 10/22/2020   Depression    Finger pain, left 11/26/2017   Gestational diabetes mellitus, antepartum 08/12/2016   Moved pt to BRX GDM program Growth [place] at 38 week EFW 7#13oz, 82nd%   Left wrist pain 06/18/2020   Migraines    Right wrist pain 11/26/2017   Skin tag 05/11/2020     Past Surgical History:  Procedure Laterality Date   CESAREAN SECTION N/A 10/17/2016   Procedure: REPEAT CESAREAN SECTION;  Surgeon: Adam Phenix, MD;  Location: [facility] BIRTHING SUITES;  Service: Obstetrics;  Laterality: N/A;   CESAREAN SECTION     DILATION AND CURETTAGE OF UTERUS      Allergies and medications reviewed and updated.   Current Outpatient Medications:    albuterol (VENTOLIN HFA) 108 (90 Base) MCG/ACT inhaler, Inhale 2 puffs into the lungs every 6 (six) hours as needed for wheezing or shortness of breath., Disp: 8 g,  Rfl: 2   Boric Acid CRYS, Place 600 mg vaginally at bedtime. Use vaginally every night for two weeks then twice a week for six months, Disp: 500 g, Rfl: 5   diclofenac Sodium (VOLTAREN ARTHRITIS PAIN) 1 % GEL, Apply 2 g topically daily., Disp: 100 g, Rfl: 1   Etonogestrel (NEXPLANON Ellis), Inject into the skin., Disp: , Rfl:    fluconazole (DIFLUCAN) 150 MG tablet, Take one tablet by mouth every three days for three doses, then take one tablet once a week for six months, Disp: 30 tablet, Rfl: 1   ketoconazole (NIZORAL) 2 % cream, Apply 1 Application topically 2 (two) times daily. To affected areas., Disp: 60 g, Rfl: 3   methocarbamol (ROBAXIN) 500 MG tablet, Take 1 tablet (500 mg total) by mouth 2 (two) times daily., Disp: 20 tablet, Rfl: 0   progesterone (PROMETRIUM) 100 MG capsule, Take 1 capsule (100 mg total) by mouth at bedtime., Disp: 30 capsule, Rfl: 1   Semaglutide-Weight Management (WEGOVY) 0.25 MG/0.5ML SOAJ, Inject 0.25 mg into the skin once a week., Disp: 2 mL, Rfl: 0   Semaglutide-Weight Management (WEGOVY) 0.25 MG/0.5ML SOAJ, Inject 0.25 mg into the skin once a week., Disp: 2 mL, Rfl: 0   Semaglutide-Weight Management (WEGOVY) 0.25 MG/0.5ML SOAJ, Inject 0.25 mg into the skin once a week., Disp: 2 mL, Rfl: 0   Semaglutide-Weight Management (WEGOVY) 0.5 MG/0.5ML SOAJ, Inject 0.5 mg into the skin once a week., Disp: 2 mL, Rfl: 0   Semaglutide-Weight Management (WEGOVY) 1 MG/0.5ML  SOAJ, Inject 1 mg into the skin once a week., Disp: 2 mL, Rfl: 0   triamcinolone (KENALOG) 0.025 % ointment, Apply 1 Application topically 2 (two) times daily., Disp: 30 g, Rfl: 0   losartan (COZAAR) 100 MG tablet, Take 1 tablet (100 mg total) by mouth at bedtime., Disp: 30 tablet, Rfl: 1  Allergies  Allergen Reactions   Flagyl [Metronidazole] Itching   Ibuprofen Swelling    Facial swelling   Nsaids Other (See Comments)    Stomach ulcers   Ondansetron Hcl Other (See Comments)    Pt states "it made me crazy"    Other     Other reaction(s): Other (See Comments) Stomach ulcers   Tramadol Itching   Levofloxacin In D5w Rash and Itching    Objective:   BP (!) 138/100   Pulse 73   Temp 98.6 F (37 C) (Oral)   Ht 5\' 9"  (1.753 m)   Wt 259 lb (117.5 kg)   SpO2 97%   BMI 38.25 kg/m      11/06/2022    8:11 AM 11/06/2022    8:08 AM 09/24/2022    8:33 AM  Vitals with BMI  Height  5\' 9"  5\' 9"   Weight  259 lbs 255 lbs  BMI  38.23 37.64  Systolic 138 140 130  Diastolic 100 100 92  Pulse  73 69     Physical Exam Vitals and nursing note reviewed. Exam conducted with a chaperone present.  Constitutional:      Appearance: Normal appearance. She is normal weight.  HENT:     Head: Normocephalic and atraumatic.  Cardiovascular:     Rate and Rhythm: Normal rate and regular rhythm.     Pulses: Normal pulses.     Heart sounds: Normal heart sounds.  Pulmonary:     Effort: Pulmonary effort is normal.     Breath sounds: Normal breath sounds.  Genitourinary:    General: Normal vulva.     Vagina: No vaginal discharge.  Skin:    General: Skin is warm and dry.  Neurological:     General: No focal deficit present.     Mental Status: She is alert and oriented to person, place, and time. Mental status is at baseline.  Psychiatric:        Mood and Affect: Mood normal.        Behavior: Behavior normal.        Thought Content: Thought content normal.        Judgment: Judgment normal.     Assessment & Plan:  Essential hypertension Assessment & Plan: Uncontrolled 140/100 today in office. Will increase Losartan to 100mg  daily. Recommend heart healthy diet such as Mediterranean diet with whole grains, fruits, vegetable, fish, lean meats, nuts, and olive oil. Limit salt. Encouraged moderate walking, 3-5 times/week for 30-50 minutes each session. Aim for at least 150 minutes.week. Goal should be pace of 3 miles/hours, or walking 1.5 miles in 30 minutes. Avoid tobacco products. Avoid excess alcohol.  Take medications as prescribed and bring medications and blood pressure log with cuff to each office visit. Seek medical care for chest pain, palpitations, shortness of breath with exertion, dizziness/lightheadedness, vision changes, recurrent headaches, or swelling of extremities. Return to office in 1 month.   Orders: -     Losartan Potassium; Take 1 tablet (100 mg total) by mouth at bedtime.  Dispense: 30 tablet; Refill: 1  Vaginal odor Assessment & Plan: Wet prep negative, no odor or discharge on exam. No  pelvic tenderness. I recommended womens health probiotics and follow up with her GYN. Return to office for new or worsening symptoms.  Orders: -     Urinalysis, Routine w reflex microscopic -     WET PREP FOR TRICH, YEAST, CLUE  Dysuria Assessment & Plan: UA negative  Orders: -     Urinalysis, Routine w reflex microscopic -     WET PREP FOR TRICH, YEAST, CLUE  Eczema, unspecified type Assessment & Plan: Requests refills of dermatologist recommended Ketoconazole and Triamcinolone creams  Orders: -     Ketoconazole; Apply 1 Application topically 2 (two) times daily. To affected areas.  Dispense: 60 g; Refill: 3 -     Triamcinolone Acetonide; Apply 1 Application topically 2 (two) times daily.  Dispense: 30 g; Refill: 0  Visit for TB skin test Assessment & Plan: Requests TB test today.  Orders: -     QuantiFERON-TB Gold Plus     Follow up plan: Return in about 4 weeks (around 12/04/2022) for hypertension.  Park Meo, FNP

## 2022-11-06 NOTE — Assessment & Plan Note (Signed)
Uncontrolled 140/100 today in office. Will increase Losartan to 100mg  daily. Recommend heart healthy diet such as Mediterranean diet with whole grains, fruits, vegetable, fish, lean meats, nuts, and olive oil. Limit salt. Encouraged moderate walking, 3-5 times/week for 30-50 minutes each session. Aim for at least 150 minutes.week. Goal should be pace of 3 miles/hours, or walking 1.5 miles in 30 minutes. Avoid tobacco products. Avoid excess alcohol. Take medications as prescribed and bring medications and blood pressure log with cuff to each office visit. Seek medical care for chest pain, palpitations, shortness of breath with exertion, dizziness/lightheadedness, vision changes, recurrent headaches, or swelling of extremities. Return to office in 1 month.

## 2022-11-06 NOTE — Assessment & Plan Note (Signed)
Wet prep negative, no odor or discharge on exam. No pelvic tenderness. I recommended womens health probiotics and follow up with her GYN. Return to office for new or worsening symptoms.

## 2022-11-06 NOTE — Assessment & Plan Note (Signed)
Requests TB test today.

## 2022-11-07 NOTE — Telephone Encounter (Signed)
Pharmacy Patient Advocate Encounter  Received notification from [facility] that Prior Authorization for Janet [name] has been APPROVED through 04/28/2023

## 2022-11-07 NOTE — Telephone Encounter (Signed)
Pt made aware

## 2022-11-09 LAB — QUANTIFERON-TB GOLD PLUS
Mitogen-NIL: 7.42 [IU]/mL
NIL: 0.02 [IU]/mL
QuantiFERON-TB Gold Plus: NEGATIVE
TB1-NIL: 0.11 [IU]/mL
TB2-NIL: 0.08 [IU]/mL

## 2022-11-13 ENCOUNTER — Other Ambulatory Visit: Payer: Self-pay

## 2022-11-13 ENCOUNTER — Other Ambulatory Visit (HOSPITAL_COMMUNITY): Payer: Self-pay

## 2022-11-13 DIAGNOSIS — N951 Menopausal and female climacteric states: Secondary | ICD-10-CM | POA: Diagnosis not present

## 2022-11-13 DIAGNOSIS — Z6837 Body mass index (BMI) 37.0-37.9, adult: Secondary | ICD-10-CM | POA: Diagnosis not present

## 2022-11-13 DIAGNOSIS — E669 Obesity, unspecified: Secondary | ICD-10-CM | POA: Diagnosis not present

## 2022-11-13 DIAGNOSIS — E559 Vitamin D deficiency, unspecified: Secondary | ICD-10-CM | POA: Diagnosis not present

## 2022-11-13 MED ORDER — WEGOVY 0.5 MG/0.5ML ~~LOC~~ SOAJ
0.5000 mg | SUBCUTANEOUS | 0 refills | Status: DC
  Filled 2022-11-13: qty 2, 28d supply, fill #0

## 2022-11-14 DIAGNOSIS — Z419 Encounter for procedure for purposes other than remedying health state, unspecified: Secondary | ICD-10-CM | POA: Diagnosis not present

## 2022-11-20 DIAGNOSIS — E669 Obesity, unspecified: Secondary | ICD-10-CM | POA: Diagnosis not present

## 2022-11-20 DIAGNOSIS — Z6837 Body mass index (BMI) 37.0-37.9, adult: Secondary | ICD-10-CM | POA: Diagnosis not present

## 2022-11-20 DIAGNOSIS — E559 Vitamin D deficiency, unspecified: Secondary | ICD-10-CM | POA: Diagnosis not present

## 2022-11-20 DIAGNOSIS — R7309 Other abnormal glucose: Secondary | ICD-10-CM | POA: Diagnosis not present

## 2022-11-21 ENCOUNTER — Other Ambulatory Visit: Payer: Self-pay | Admitting: Family Medicine

## 2022-11-21 DIAGNOSIS — I10 Essential (primary) hypertension: Secondary | ICD-10-CM

## 2022-11-29 ENCOUNTER — Other Ambulatory Visit (HOSPITAL_COMMUNITY): Payer: Self-pay

## 2022-12-01 ENCOUNTER — Other Ambulatory Visit: Payer: Self-pay

## 2022-12-04 ENCOUNTER — Ambulatory Visit: Payer: Medicaid Other | Admitting: Family Medicine

## 2022-12-04 DIAGNOSIS — I10 Essential (primary) hypertension: Secondary | ICD-10-CM | POA: Diagnosis not present

## 2022-12-04 DIAGNOSIS — E669 Obesity, unspecified: Secondary | ICD-10-CM | POA: Diagnosis not present

## 2022-12-04 DIAGNOSIS — Z6837 Body mass index (BMI) 37.0-37.9, adult: Secondary | ICD-10-CM | POA: Diagnosis not present

## 2022-12-14 DIAGNOSIS — Z419 Encounter for procedure for purposes other than remedying health state, unspecified: Secondary | ICD-10-CM | POA: Diagnosis not present

## 2022-12-15 ENCOUNTER — Other Ambulatory Visit (HOSPITAL_COMMUNITY): Payer: Self-pay

## 2022-12-16 ENCOUNTER — Ambulatory Visit (INDEPENDENT_AMBULATORY_CARE_PROVIDER_SITE_OTHER): Payer: Medicaid Other | Admitting: Neurology

## 2022-12-16 DIAGNOSIS — R351 Nocturia: Secondary | ICD-10-CM

## 2022-12-16 DIAGNOSIS — Z9189 Other specified personal risk factors, not elsewhere classified: Secondary | ICD-10-CM

## 2022-12-16 DIAGNOSIS — R519 Headache, unspecified: Secondary | ICD-10-CM

## 2022-12-16 DIAGNOSIS — R030 Elevated blood-pressure reading, without diagnosis of hypertension: Secondary | ICD-10-CM

## 2022-12-16 DIAGNOSIS — R0689 Other abnormalities of breathing: Secondary | ICD-10-CM

## 2022-12-16 DIAGNOSIS — R002 Palpitations: Secondary | ICD-10-CM

## 2022-12-16 DIAGNOSIS — G4719 Other hypersomnia: Secondary | ICD-10-CM

## 2022-12-16 DIAGNOSIS — G4720 Circadian rhythm sleep disorder, unspecified type: Secondary | ICD-10-CM

## 2022-12-16 DIAGNOSIS — E669 Obesity, unspecified: Secondary | ICD-10-CM

## 2022-12-16 DIAGNOSIS — R0681 Apnea, not elsewhere classified: Secondary | ICD-10-CM

## 2022-12-16 DIAGNOSIS — R0683 Snoring: Secondary | ICD-10-CM | POA: Diagnosis not present

## 2022-12-18 ENCOUNTER — Other Ambulatory Visit (HOSPITAL_COMMUNITY): Payer: Self-pay

## 2022-12-18 DIAGNOSIS — E669 Obesity, unspecified: Secondary | ICD-10-CM | POA: Diagnosis not present

## 2022-12-18 DIAGNOSIS — Z6836 Body mass index (BMI) 36.0-36.9, adult: Secondary | ICD-10-CM | POA: Diagnosis not present

## 2022-12-18 DIAGNOSIS — I10 Essential (primary) hypertension: Secondary | ICD-10-CM | POA: Diagnosis not present

## 2022-12-18 DIAGNOSIS — R7309 Other abnormal glucose: Secondary | ICD-10-CM | POA: Diagnosis not present

## 2022-12-18 MED ORDER — WEGOVY 0.5 MG/0.5ML ~~LOC~~ SOAJ
0.5000 mg | SUBCUTANEOUS | 0 refills | Status: DC
  Filled 2022-12-18: qty 2, 28d supply, fill #0

## 2022-12-23 ENCOUNTER — Other Ambulatory Visit (HOSPITAL_COMMUNITY): Payer: Self-pay

## 2022-12-23 NOTE — Procedures (Signed)
Physician Interpretation:     [facility] POLYSOMNOGRAPHY  INTERPRETATION REPORT   STUDY DATE:  12/16/2022     PATIENT NAME:  Janet [name]         DATE OF BIRTH:  [birth year]  PATIENT ID:  [id number]    TYPE OF STUDY:  PSG  READING PHYSICIAN: Huston Foley, MD, PhD   SCORING TECHNICIAN: Margaretann Loveless, RPSGT  Referred by: Park Meo, FNP  ? History and Indication for Testing: 37 year old female with an underlying medical history of hypertension, asthma, anemia, migraine headaches, depression, knee pain, gestational diabetes, and obesity, who reports snoring and excessive daytime somnolence. Her Epworth sleepiness score is 15 out of 24, fatigue severity score is 61 out of 63.  Height: 69 in Weight: 255 lb (BMI 37) Neck Size: 15 in    MEDICATIONS: Albuterol, Boric Acid, Voltaren Gel, Nexplanon, Diflucan, Cozaar, Prometrium, Robaxin, [id number]   TECHNICAL DESCRIPTION: A registered sleep technologist was in attendance for the duration of the recording.  Data collection, scoring, video monitoring, and reporting were performed in compliance with the AASM Manual for the Scoring of Sleep and Associated Events; (Hypopnea is scored based on the criteria listed in Section VIII D. 1b in the AASM Manual V2.6 using a 4% oxygen desaturation rule or Hypopnea is scored based on the criteria listed in Section VIII D. 1a in the AASM Manual V2.6 using 3% oxygen desaturation and /or arousal rule).   SLEEP CONTINUITY AND SLEEP ARCHITECTURE:  Lights-out was at 21:20: and lights-on at  05:03:, with a total recording time of 7 hours, 44 min. Total sleep time ( TST) was 332.5 minutes with a decreased sleep efficiency at 71.7%. There was  25.3% REM sleep.   BODY POSITION:  TST was divided  between the following sleep positions: 12.6% supine;  50.8% lateral;  37% prone. Duration of total sleep and percent of total sleep in their respective position is as follows: supine 42 minutes (13%),  non-supine 291 minutes (87%); right 147 minutes (44%), left 22 minutes (7%), and prone 121 minutes (37%).  Total supine REM sleep time was 00 minutes (0% of total REM sleep).  Sleep latency was increased at 108.0 minutes.  REM sleep latency was decreased at 39.5 minutes. Of the total sleep time, the percentage of stage N1 sleep was 3.8%, stage N2 sleep was 69%, which is increased, stage N3 sleep was 2.4%, which is reduced, and REM sleep was 25.3%, which is high normal.  Wake after sleep onset (WASO) time accounted for 23.5 minutes with minimal sleep fragmentation noted.   RESPIRATORY MONITORING:   Based on CMS criteria (using a 4% oxygen desaturation rule for scoring hypopneas), there were 0 apneas (0 obstructive; 0 central; 0 mixed), and 18 hypopneas.  Apnea index was 0.0. Hypopnea index was 3.2. The apnea-hypopnea index was 3.25/hour overall (0.0 supine, 10 non-supine; 10.0 REM, 0.0 supine REM).  There were 0 respiratory effort-related arousals (RERAs).  The RERA index was 0 events/h. Total respiratory disturbance index (RDI) was 3.2 events/h. RDI results showed: supine RDI  0.0 /h; non-supine RDI 3.7 /h; REM RDI 10.0 /h, supine REM RDI 0.0 /h.   Based on AASM criteria (using a 3% oxygen desaturation and /or arousal rule for scoring hypopneas), there were 0 apneas (0 obstructive; 0 central; 0 mixed), and 18 hypopneas. Apnea index was 0.0. Hypopnea index was 3.2. The apnea-hypopnea index was 3.2 overall (0.0 supine, 10 non-supine; 10.0 REM, 0.0 supine REM).  There were 0  respiratory effort-related arousals (RERAs).  The RERA index was 0 events/h. Total respiratory disturbance index (RDI) was 3.2 events/h. RDI results showed: supine RDI  0.0 /h; non-supine RDI 3.7 /h; REM RDI 10.0 /h, supine REM RDI 0.0 /h.    OXIMETRY: Oxyhemoglobin Saturation Nadir during sleep was at  89% from a mean of 97%.  Of the Total sleep time (TST)   hypoxemia (=<88%) was present for  0.0 minutes, or 0.0% of total sleep time.    LIMB MOVEMENTS: There were 0 periodic limb movements of sleep (0.0/hr), of which 0 (0.0/hr) were associated with an arousal.   AROUSAL: There were 47 arousals in total, for an arousal index of 8 arousals/hour.  Of these, 4 were identified as respiratory-related arousals (1 /h), 0 were PLM-related arousals (0 /h), and 51 were non-specific arousals (9 /h).   EEG: Review of the EEG showed no abnormal electrical discharges and symmetrical bihemispheric findings.    EKG: The EKG revealed normal sinus rhythm (NSR). The average heart rate during sleep was 72 bpm.  AUDIO/VIDEO REVIEW: The audio and video review did not show any abnormal or unusual behaviors, movements, phonations or vocalizations. The patient took no restroom breaks. Snoring was noted, and was intermittent, in the mild to moderate range.  POST-STUDY QUESTIONNAIRE: Post study, the patient indicated, that sleep was worse than usual.   IMPRESSION:   1. Primary Snoring 2. Dysfunctions associated with sleep stages or arousal from sleep  RECOMMENDATIONS:   1. This study does not demonstrate any significant obstructive or central sleep disordered breathing with an AHI of less than 5/hour - Her AHI was 3.3/hour - and oxygen saturations at or above 89% for the night. Mild to moderate intermittent snoring was noted. Treatment with a positive airway pressure device, such as CPAP or autoPAP is not indicated. Weight loss and avoiding the supine sleep position may aid in reducing her snoring. For disturbing snoring, an oral appliance (through a qualified dentist) can be considered.  2. This study shows some sleep fragmentation and mildly abnormal sleep stage percentages; these are nonspecific findings and per se do not signify an intrinsic sleep disorder or a cause for the patient's sleep-related symptoms. Causes include (but are not limited to) the first night effect of the sleep study, circadian rhythm disturbances, medication effect or an  underlying mood disorder or medical problem.  3. The patient should be cautioned not to drive, work at heights, or operate dangerous or heavy equipment when tired or sleepy. Review and reiteration of good sleep hygiene measures should be pursued with any patient. 4. The patient will be advised to follow up with the referring provider, who will be notified of the test results.   I certify that I have reviewed the entire raw data recording prior to the issuance of this report in accordance with the Standards of Accreditation of the American Academy of Sleep Medicine (AASM).  Huston Foley, MD, PhD Medical Director, [facility]) Diplomat, ABPN (Neurology and Sleep)               Technical Report:   General Information  Name: Shatori, Libera BMI: 37.66 Physician: ,   ID: [id number] Height: 69.0 in Technician: Margaretann Loveless  Sex: Female Weight: 255.0 lb Record: [id number]  Age: 38 [1985-10-22] Date: 12/16/2022 Scorer: Margaretann Loveless  Medical & Medication History    37 year old female with an underlying medical history of hypertension, asthma, anemia, migraine headaches, depression, knee pain, gestational diabetes, and obesity,  who reports snoring and excessive daytime somnolence. She reports snoring which has become worse and apneas per husband's observation and also waking up with a sense of gasping for air. Albuterol, Boric Acid, Voltaren Gel, Nexplanon, Diflucan, Cozaar, Prometrium, Robaxin, Wegovy   Sleep Disorder      Comments   The patient came into the sleep lab for a PSG. Per the patient she is not taking Robaxin. No restroom breaks. EKG did not show any obvious cardiac arrhythmias. Mild to moderate snoring. Respiratory events scored with a 4% desat. Majority of respiratory events in REM. The patient slept supine, prone and lateral. AHI was 5.2 after 2 hrs of TST. A few leg movements.    Lights out: 09:20:01 PM Lights on: 05:03:37 AM    Time Total Supine Side Prone Upright  Recording (TRT) 7h 44.84m 2h 0.70m 3h 37.20m 2h 6.18m 0h 0.42m  Sleep (TST) 5h 32.31m 0h 42.3m 2h 49.67m 2h 1.27m 0h 0.18m   Latency N1 N2 N3 REM Onset Per. Slp. Eff.  Actual 0h 0.67m 0h 3.33m 3h 2.20m 0h 39.35m 1h 48.26m 1h 49.69m 71.66%   Stg Dur Wake N1 N2 N3 REM  Total 131.5 12.5 228.0 8.0 84.0  Supine 78.5 0.0 42.0 0.0 0.0  Side 48.0 10.5 86.5 0.0 72.0  Prone 5.0 2.0 99.5 8.0 12.0  Upright 0.0 0.0 0.0 0.0 0.0   Stg % Wake N1 N2 N3 REM  Total 28.3 3.8 68.6 2.4 25.3  Supine 16.9 0.0 12.6 0.0 0.0  Side 10.3 3.2 26.0 0.0 21.7  Prone 1.1 0.6 29.9 2.4 3.6  Upright 0.0 0.0 0.0 0.0 0.0     Apnea Summary Sub Supine Side Prone Upright  Total 0 Total 0 0 0 0 0    REM 0 0 0 0 0    NREM 0 0 0 0 0  Obs 0 REM 0 0 0 0 0    NREM 0 0 0 0 0  Mix 0 REM 0 0 0 0 0    NREM 0 0 0 0 0  Cen 0 REM 0 0 0 0 0    NREM 0 0 0 0 0   Rera Summary Sub Supine Side Prone Upright  Total 0 Total 0 0 0 0 0    REM 0 0 0 0 0    NREM 0 0 0 0 0   Hypopnea Summary Sub Supine Side Prone Upright  Total 18 Total 18 0 15 3 0    REM 14 0 11 3 0    NREM 4 0 4 0 0   4% Hypopnea Summary Sub Supine Side Prone Upright  Total (4%) 18 Total 18 0 15 3 0    REM 14 0 11 3 0    NREM 4 0 4 0 0     AHI Total Obs Mix Cen  3.25 Apnea 0.00 0.00 0.00 0.00   Hypopnea 3.25 -- -- --  3.25 Hypopnea (4%) 3.25 -- -- --    Total Supine Side Prone Upright  Position AHI 3.25 0.00 5.33 1.48 0.00  REM AHI 10.00   NREM AHI 0.97   Position RDI 3.25 0.00 5.33 1.48 0.00  REM RDI 10.00   NREM RDI 0.97    4% Hypopnea Total Supine Side Prone Upright  Position AHI (4%) 3.25 0.00 5.33 1.48 0.00  REM AHI (4%) 10.00   NREM AHI (4%) 0.97   Position RDI (4%) 3.25 0.00 5.33 1.48 0.00  REM RDI (4%) 10.00  NREM RDI (4%) 0.97    Desaturation Information Threshold: 2% <100% <90% <80% <70% <60% <50% <40%  Supine 12.0 0.0 0.0 0.0 0.0 0.0 0.0  Side 74.0 0.0 0.0 0.0 0.0 0.0 0.0  Prone 14.0 1.0 0.0 0.0 0.0 0.0  0.0  Upright 0.0 0.0 0.0 0.0 0.0 0.0 0.0  Total 100.0 1.0 0.0 0.0 0.0 0.0 0.0  Index 16.9 0.2 0.0 0.0 0.0 0.0 0.0   Threshold: 3% <100% <90% <80% <70% <60% <50% <40%  Supine 2.0 0.0 0.0 0.0 0.0 0.0 0.0  Side 30.0 0.0 0.0 0.0 0.0 0.0 0.0  Prone 7.0 1.0 0.0 0.0 0.0 0.0 0.0  Upright 0.0 0.0 0.0 0.0 0.0 0.0 0.0  Total 39.0 1.0 0.0 0.0 0.0 0.0 0.0  Index 6.6 0.2 0.0 0.0 0.0 0.0 0.0   Threshold: 4% <100% <90% <80% <70% <60% <50% <40%  Supine 2.0 0.0 0.0 0.0 0.0 0.0 0.0  Side 18.0 0.0 0.0 0.0 0.0 0.0 0.0  Prone 4.0 1.0 0.0 0.0 0.0 0.0 0.0  Upright 0.0 0.0 0.0 0.0 0.0 0.0 0.0  Total 24.0 1.0 0.0 0.0 0.0 0.0 0.0  Index 4.1 0.2 0.0 0.0 0.0 0.0 0.0   Threshold: 4% <100% <90% <80% <70% <60% <50% <40%  Supine 2 0 0 0 0 0 0  Side 18 0 0 0 0 0 0  Prone 4 1 0 0 0 0 0  Upright 0 0 0 0 0 0 0  Total 24 1 0 0 0 0 0   Awakening/Arousal Information # of Awakenings 18  Wake after sleep onset 23.39m  Wake after persistent sleep 23.108m   Arousal Assoc. Arousals Index  Apneas 0 0.0  Hypopneas 4 0.7  Leg Movements 0 0.0  Snore 0 0.0  PTT Arousals 0 0.0  Spontaneous 51 9.2  Total 55 9.9  Leg Movement Information PLMS LMs Index  Total LMs during PLMS 0 0.0  LMs w/ Microarousals 0 0.0   LM LMs Index  w/ Microarousal 0 0.0  w/ Awakening 0 0.0  w/ Resp Event 0 0.0  Spontaneous 9 1.6  Total 9 1.6     Desaturation threshold setting: 4% Minimum desaturation setting: 10 seconds SaO2 nadir: 89% The longest event was a 70 sec obstructive Hypopnea with a minimum SaO2 of 90%. The lowest SaO2 was 89% associated with a 41 sec obstructive Hypopnea. EKG Rates EKG Avg Max Min  Awake 69 103 58  Asleep 72 103 59  EKG Events: Tachycardia

## 2022-12-25 DIAGNOSIS — Z6835 Body mass index (BMI) 35.0-35.9, adult: Secondary | ICD-10-CM | POA: Diagnosis not present

## 2022-12-25 DIAGNOSIS — R7309 Other abnormal glucose: Secondary | ICD-10-CM | POA: Diagnosis not present

## 2022-12-25 DIAGNOSIS — E669 Obesity, unspecified: Secondary | ICD-10-CM | POA: Diagnosis not present

## 2023-01-02 DIAGNOSIS — R5383 Other fatigue: Secondary | ICD-10-CM | POA: Diagnosis not present

## 2023-01-02 DIAGNOSIS — Z6836 Body mass index (BMI) 36.0-36.9, adult: Secondary | ICD-10-CM | POA: Diagnosis not present

## 2023-01-02 DIAGNOSIS — I10 Essential (primary) hypertension: Secondary | ICD-10-CM | POA: Diagnosis not present

## 2023-01-02 DIAGNOSIS — E669 Obesity, unspecified: Secondary | ICD-10-CM | POA: Diagnosis not present

## 2023-01-09 ENCOUNTER — Other Ambulatory Visit (HOSPITAL_COMMUNITY): Payer: Self-pay

## 2023-01-14 DIAGNOSIS — Z419 Encounter for procedure for purposes other than remedying health state, unspecified: Secondary | ICD-10-CM | POA: Diagnosis not present

## 2023-01-29 ENCOUNTER — Encounter: Payer: Self-pay | Admitting: Family Medicine

## 2023-01-29 ENCOUNTER — Ambulatory Visit: Payer: Medicaid Other | Admitting: Family Medicine

## 2023-01-29 ENCOUNTER — Other Ambulatory Visit (HOSPITAL_COMMUNITY): Payer: Self-pay

## 2023-01-29 VITALS — BP 115/70 | HR 81 | Temp 98.1°F | Ht 69.0 in | Wt 247.0 lb

## 2023-01-29 DIAGNOSIS — B9689 Other specified bacterial agents as the cause of diseases classified elsewhere: Secondary | ICD-10-CM | POA: Diagnosis not present

## 2023-01-29 DIAGNOSIS — N644 Mastodynia: Secondary | ICD-10-CM | POA: Diagnosis not present

## 2023-01-29 DIAGNOSIS — Z975 Presence of (intrauterine) contraceptive device: Secondary | ICD-10-CM | POA: Diagnosis not present

## 2023-01-29 DIAGNOSIS — K219 Gastro-esophageal reflux disease without esophagitis: Secondary | ICD-10-CM | POA: Diagnosis not present

## 2023-01-29 DIAGNOSIS — Z23 Encounter for immunization: Secondary | ICD-10-CM

## 2023-01-29 DIAGNOSIS — E669 Obesity, unspecified: Secondary | ICD-10-CM | POA: Diagnosis not present

## 2023-01-29 DIAGNOSIS — N760 Acute vaginitis: Secondary | ICD-10-CM | POA: Diagnosis not present

## 2023-01-29 DIAGNOSIS — Z309 Encounter for contraceptive management, unspecified: Secondary | ICD-10-CM | POA: Diagnosis not present

## 2023-01-29 DIAGNOSIS — Z6835 Body mass index (BMI) 35.0-35.9, adult: Secondary | ICD-10-CM | POA: Diagnosis not present

## 2023-01-29 DIAGNOSIS — N898 Other specified noninflammatory disorders of vagina: Secondary | ICD-10-CM

## 2023-01-29 DIAGNOSIS — R7309 Other abnormal glucose: Secondary | ICD-10-CM | POA: Diagnosis not present

## 2023-01-29 LAB — WET PREP FOR TRICH, YEAST, CLUE

## 2023-01-29 MED ORDER — CLINDAMYCIN HCL 300 MG PO CAPS: 300.0000 mg | ORAL_CAPSULE | Freq: Two times a day (BID) | ORAL | 0 refills | Status: AC

## 2023-01-29 MED ORDER — POLYETHYLENE GLYCOL 3350 17 GM/SCOOP PO POWD: 17.0000 g | Freq: Every day | ORAL | 1 refills | Status: DC | PRN

## 2023-01-29 MED ORDER — OMEPRAZOLE 20 MG PO CPDR: 20.0000 mg | DELAYED_RELEASE_CAPSULE | Freq: Every day | ORAL | 3 refills | Status: DC

## 2023-01-29 NOTE — Assessment & Plan Note (Signed)
Wet prep positive for clue cells and bacterial in presence of positive whiff test. Start Clindamycin 300mg  BID x7d due to Flagyl allergy. Return to office if symptoms persist or worsen.

## 2023-01-29 NOTE — Progress Notes (Signed)
Subjective:  HPI: Janet [name] is a 38 y.o. female presenting on 01/29/2023 for Acute Visit (She is having vagina discharge, breast is a little sore, stomach is sore. This has been going on for 2 months/)   HPI Patient is in today for 2 months malodorous, thick white vaginal discharge, breast tenderness and stomach cramps. She does have a nexplanon for 2 years and does not have periods. Ms faso denies vaginal bleeding, pain with intercourse, vaginal itching or swelling. She is experiencing lower abdominal cramping and consitpation. Her bowel movements are more infrequent since she has been on Wegovy, she denies fever, chills, malaise, nausea, vomiting, diarrhea, or severe abdominal pain. She has tried increasing dietary fiber and water intake. She also endorses uncontrolled GERD, has tried Tums and Simethicone. Her breast tenderness is worse on the right side. It lasts 2 days and has occurred several times over the past two months described as aching. She denies skin changes, nipple changes, nipple discharge, or lumps. Has had a mammogram in the past for breast tenderness that was negative. She would like to repeat the mammogram.  Review of Systems  All other systems reviewed and are negative.   Relevant past medical history reviewed and updated as indicated.   Past Medical History:  Diagnosis Date   Abdominal pain 05/06/2021   Anemia    Asthma    Bilateral knee pain 10/22/2020   Depression    Finger pain, left 11/26/2017   Gestational diabetes mellitus, antepartum 08/12/2016   Moved pt to BRX GDM program Growth [place] at 38 week EFW 7#13oz, 82nd%   Left wrist pain 06/18/2020   Migraines    Right wrist pain 11/26/2017   Skin tag 05/11/2020     Past Surgical History:  Procedure Laterality Date   CESAREAN SECTION N/A 10/17/2016   Procedure: REPEAT CESAREAN SECTION;  Surgeon: Adam Phenix, MD;  Location: [facility] BIRTHING SUITES;  Service: Obstetrics;  Laterality: N/A;   CESAREAN  SECTION     DILATION AND CURETTAGE OF UTERUS      Allergies and medications reviewed and updated.   Current Outpatient Medications:    albuterol (VENTOLIN HFA) 108 (90 Base) MCG/ACT inhaler, Inhale 2 puffs into the lungs every 6 (six) hours as needed for wheezing or shortness of breath., Disp: 8 g, Rfl: 2   Boric Acid CRYS, Place 600 mg vaginally at bedtime. Use vaginally every night for two weeks then twice a week for six months, Disp: 500 g, Rfl: 5   clindamycin (CLEOCIN) 300 MG capsule, Take 1 capsule (300 mg total) by mouth 2 (two) times daily for 7 days., Disp: 14 capsule, Rfl: 0   diclofenac Sodium (VOLTAREN ARTHRITIS PAIN) 1 % GEL, Apply 2 g topically daily., Disp: 100 g, Rfl: 1   Etonogestrel (NEXPLANON [place]), Inject into the skin., Disp: , Rfl:    fluconazole (DIFLUCAN) 150 MG tablet, Take one tablet by mouth every three days for three doses, then take one tablet once a week for six months, Disp: 30 tablet, Rfl: 1   ketoconazole (NIZORAL) 2 % cream, Apply 1 Application topically 2 (two) times daily. To affected areas., Disp: 60 g, Rfl: 3   losartan (COZAAR) 100 MG tablet, TAKE 1 TABLET BY MOUTH EVERYDAY AT BEDTIME, Disp: 90 tablet, Rfl: 1   methocarbamol (ROBAXIN) 500 MG tablet, Take 1 tablet (500 mg total) by mouth 2 (two) times daily., Disp: 20 tablet, Rfl: 0   omeprazole (PRILOSEC) 20 MG capsule, Take 1 capsule (20 mg  total) by mouth daily., Disp: 30 capsule, Rfl: 3   polyethylene glycol powder (GLYCOLAX/MIRALAX) 17 GM/SCOOP powder, Take 17 g by mouth daily as needed for mild constipation or moderate constipation., Disp: 3350 g, Rfl: 1   progesterone (PROMETRIUM) 100 MG capsule, Take 1 capsule (100 mg total) by mouth at bedtime., Disp: 30 capsule, Rfl: 1   Semaglutide-Weight Management (WEGOVY) 0.5 MG/0.5ML SOAJ, Inject 0.5 mg into the skin once a week., Disp: 2 mL, Rfl: 0   Semaglutide-Weight Management (WEGOVY) 1 MG/0.5ML SOAJ, Inject 1 mg into the skin once a week., Disp: 2 mL, Rfl:  0   triamcinolone (KENALOG) 0.025 % ointment, Apply 1 Application topically 2 (two) times daily., Disp: 30 g, Rfl: 0   Semaglutide-Weight Management (WEGOVY) 0.25 MG/0.5ML SOAJ, Inject 0.25 mg into the skin once a week. (Patient not taking: Reported on 01/29/2023), Disp: 2 mL, Rfl: 0   Semaglutide-Weight Management (WEGOVY) 0.25 MG/0.5ML SOAJ, Inject 0.25 mg into the skin once a week. (Patient not taking: Reported on 01/29/2023), Disp: 2 mL, Rfl: 0   Semaglutide-Weight Management (WEGOVY) 0.25 MG/0.5ML SOAJ, Inject 0.25 mg into the skin once a week. (Patient not taking: Reported on 01/29/2023), Disp: 2 mL, Rfl: 0   Semaglutide-Weight Management (WEGOVY) 0.5 MG/0.5ML SOAJ, Inject 0.5 mg into the skin once a week. (Patient not taking: Reported on 01/29/2023), Disp: 2 mL, Rfl: 0   Semaglutide-Weight Management (WEGOVY) 0.5 MG/0.5ML SOAJ, Inject 0.5 mg into the skin once a week. (Patient not taking: Reported on 01/29/2023), Disp: 2 mL, Rfl: 0  Allergies  Allergen Reactions   Flagyl [Metronidazole] Itching   Ibuprofen Swelling    Facial swelling   Nsaids Other (See Comments)    Stomach ulcers   Ondansetron Hcl Other (See Comments)    Pt states "it made me crazy"   Other     Other reaction(s): Other (See Comments) Stomach ulcers   Tramadol Itching   Levofloxacin In D5w Rash and Itching    Objective:   BP 115/70   Pulse 81   Temp 98.1 F (36.7 C) (Oral)   Ht 5\' 9"  (1.753 m)   Wt 247 lb (112 kg)   SpO2 97%   BMI 36.48 kg/m      01/29/2023    3:20 PM 11/06/2022    8:11 AM 11/06/2022    8:08 AM  Vitals with BMI  Height 5\' 9"   5\' 9"   Weight 247 lbs  259 lbs  BMI 36.46  38.23  Systolic 115 138 130  Diastolic 70 100 100  Pulse 81  73     Physical Exam Vitals and nursing note reviewed. Exam conducted with a chaperone present.  Constitutional:      Appearance: Normal appearance. She is normal weight.  HENT:     Head: Normocephalic and atraumatic.  Abdominal:     General: Bowel  sounds are normal. There is no distension.     Palpations: Abdomen is soft.     Tenderness: There is abdominal tenderness in the right lower quadrant, suprapubic area and left lower quadrant.     Hernia: A hernia is present. Hernia is present in the umbilical area.  Genitourinary:    General: Normal vulva.     Vagina: Vaginal discharge present.  Skin:    General: Skin is warm and dry.  Neurological:     General: No focal deficit present.     Mental Status: She is alert and oriented to person, place, and time. Mental status is at baseline.  Psychiatric:        Mood and Affect: Mood normal.        Behavior: Behavior normal.        Thought Content: Thought content normal.        Judgment: Judgment normal.     Assessment & Plan:  Bacterial vaginosis Assessment & Plan: Wet prep positive for clue cells and bacterial in presence of positive whiff test. Start Clindamycin 300mg  BID x7d due to Flagyl allergy. Return to office if symptoms persist or worsen.  Orders: -     WET PREP FOR TRICH, YEAST, CLUE  Encounter for contraceptive management, unspecified type -     Ambulatory referral to Obstetrics / Gynecology  Vaginal discharge -     WET PREP FOR TRICH, YEAST, CLUE  Breast tenderness Assessment & Plan: No mass on exam however she is tender. Requests repeat mammogram. Discussed this could be cyclical and she would like to proceed with mammo.  Orders: -     MM Digital Diagnostic Bilat; Future  Nexplanon in place since 05/17/2020 Assessment & Plan: Refer to GYN   Gastroesophageal reflux disease without esophagitis Assessment & Plan: No red flags. Start omeprazole 20mg  daily. Elevated HOB if needed and avoid lying down 2-3 hours after eating, avoid coffee, alcohol, chocolate, fatty foods, citrus, carbonated beverages, spicy foods, late meals, and smoking. Return to office if symptoms return or worsen and seek medical care for difficulty swallowing, bleeding, anemia, weight loss,  recurrent vomiting     Other orders -     Omeprazole; Take 1 capsule (20 mg total) by mouth daily.  Dispense: 30 capsule; Refill: 3 -     Polyethylene Glycol 3350; Take 17 g by mouth daily as needed for mild constipation or moderate constipation.  Dispense: 3350 g; Refill: 1 -     Clindamycin HCl; Take 1 capsule (300 mg total) by mouth 2 (two) times daily for 7 days.  Dispense: 14 capsule; Refill: 0     Follow up plan: Return if symptoms worsen or fail to improve.  Park Meo, FNP

## 2023-01-29 NOTE — Assessment & Plan Note (Signed)
No mass on exam however she is tender. Requests repeat mammogram. Discussed this could be cyclical and she would like to proceed with mammo.

## 2023-01-29 NOTE — Assessment & Plan Note (Signed)
No red flags. Start omeprazole 20mg  daily. Elevated HOB if needed and avoid lying down 2-3 hours after eating, avoid coffee, alcohol, chocolate, fatty foods, citrus, carbonated beverages, spicy foods, late meals, and smoking. Return to office if symptoms return or worsen and seek medical care for difficulty swallowing, bleeding, anemia, weight loss, recurrent vomiting

## 2023-01-29 NOTE — Assessment & Plan Note (Signed)
Refer to GYN. 

## 2023-01-30 ENCOUNTER — Other Ambulatory Visit: Payer: Self-pay | Admitting: Family Medicine

## 2023-01-30 DIAGNOSIS — N644 Mastodynia: Secondary | ICD-10-CM

## 2023-01-30 NOTE — Addendum Note (Signed)
Addended by: Arta Silence on: 01/30/2023 08:20 AM   Modules accepted: Orders

## 2023-02-05 DIAGNOSIS — R7309 Other abnormal glucose: Secondary | ICD-10-CM | POA: Diagnosis not present

## 2023-02-05 DIAGNOSIS — Z6836 Body mass index (BMI) 36.0-36.9, adult: Secondary | ICD-10-CM | POA: Diagnosis not present

## 2023-02-14 DIAGNOSIS — Z419 Encounter for procedure for purposes other than remedying health state, unspecified: Secondary | ICD-10-CM | POA: Diagnosis not present

## 2023-02-19 ENCOUNTER — Other Ambulatory Visit: Payer: Self-pay

## 2023-02-19 ENCOUNTER — Other Ambulatory Visit (HOSPITAL_COMMUNITY): Payer: Self-pay

## 2023-02-19 DIAGNOSIS — Z6835 Body mass index (BMI) 35.0-35.9, adult: Secondary | ICD-10-CM | POA: Diagnosis not present

## 2023-02-19 DIAGNOSIS — R5383 Other fatigue: Secondary | ICD-10-CM | POA: Diagnosis not present

## 2023-02-19 MED ORDER — WEGOVY 0.5 MG/0.5ML ~~LOC~~ SOAJ
0.5000 mg | SUBCUTANEOUS | 0 refills | Status: DC
  Filled 2023-02-19 – 2023-02-26 (×3): qty 2, 28d supply, fill #0

## 2023-02-26 ENCOUNTER — Other Ambulatory Visit: Payer: Self-pay | Admitting: "Endocrinology

## 2023-02-26 ENCOUNTER — Other Ambulatory Visit (HOSPITAL_COMMUNITY): Payer: Self-pay

## 2023-02-27 ENCOUNTER — Ambulatory Visit: Payer: Medicaid Other

## 2023-02-27 ENCOUNTER — Ambulatory Visit
Admission: RE | Admit: 2023-02-27 | Discharge: 2023-02-27 | Disposition: A | Discharge status: Home / Self Care | Payer: Medicaid Other | Source: Ambulatory Visit | Attending: Family Medicine | Admitting: Family Medicine

## 2023-02-27 ENCOUNTER — Encounter (HOSPITAL_COMMUNITY): Payer: Self-pay

## 2023-02-27 ENCOUNTER — Other Ambulatory Visit (HOSPITAL_COMMUNITY): Payer: Self-pay

## 2023-02-27 DIAGNOSIS — N644 Mastodynia: Secondary | ICD-10-CM

## 2023-03-02 NOTE — Telephone Encounter (Unsigned)
Copied from CRM [id number]. Topic: Medical Record Request - Records Request >> Mar 02, 2023  9:20 AM Adelina Mings wrote: Reason for CRM: patient is requesting immunization records just a printout

## 2023-03-10 ENCOUNTER — Encounter: Payer: Self-pay | Admitting: Family Medicine

## 2023-03-10 ENCOUNTER — Ambulatory Visit (INDEPENDENT_AMBULATORY_CARE_PROVIDER_SITE_OTHER): Payer: Medicaid Other | Admitting: Family Medicine

## 2023-03-10 VITALS — BP 138/94 | HR 76 | Temp 98.6°F | Ht 69.0 in | Wt 242.0 lb

## 2023-03-10 DIAGNOSIS — L918 Other hypertrophic disorders of the skin: Secondary | ICD-10-CM | POA: Diagnosis not present

## 2023-03-10 NOTE — Assessment & Plan Note (Signed)
 4 skin tags removed prior to cryotherapy gun technical difficulties. Unable to complete freezing of 5th tag (superior abdominal). No complications. Discussed expected course of treatment and when to return to office.

## 2023-03-10 NOTE — Progress Notes (Signed)
 Subjective:  HPI: Janet [name] is a 38 y.o. female presenting on 03/10/2023 for Follow-up (skin tag removal/pt c/o of feeling dizzy when she stands up, been like this since Sat/Suday. )   HPI Patient is in today for skin tag removal. Janet [name] has several skin tags present on her neck and abdomen she would like removed, 3 on the right side of her neck and 2 on her RLQ abdomen. She has a history of skin tags that have been removed without complication.  Discussed risks vs benefits and she would like to proceed with Cryotherapy.  Janet [name] also reports today she had some lightheadedness and high blood pressure over the weekend to as high as 200s SBP. This is improving. She is continuing to monitor her BP. She has no associated symptoms including chest pain, palpitations, headache, vision changes, dyspnea, swelling of extremities. Her children have been sick recently and she also endorses nasal congestion and fatigue. Encouraged to continue to monitor BP and symptoms and seek medical care if worsening.  Procedure: Skin tag removal via Cryotherapy Informed consent:  Discussed risks (permanent scarring, infection, pain, bleeding, bruising, redness, and recurrence of the lesion) and benefits of the procedure, as well as the alternatives.  She is aware that skin tags are benign lesions, and their removal is often not considered medically necessary.  Informed consent was obtained. Anesthesia: none  The area was prepared and draped in a standard fashion. Cryotherapy was performed.     The patient tolerated procedure well. The patient was instructed on post-op care.   Number of lesions treated:  4   Review of Systems  All other systems reviewed and are negative.   Relevant past medical history reviewed and updated as indicated.   Past Medical History:  Diagnosis Date   Abdominal pain 05/06/2021   Anemia    Asthma    Bilateral knee pain 10/22/2020   Depression    Finger pain, left  11/26/2017   Gestational diabetes mellitus, antepartum 08/12/2016   Moved pt to [facility]X GDM program Growth [place] at 38 week EFW 7#13oz, 82nd%   Left wrist pain 06/18/2020   Migraines    Right wrist pain 11/26/2017   Skin tag 05/11/2020     Past Surgical History:  Procedure Laterality Date   CESAREAN SECTION N/A 10/17/2016   Procedure: REPEAT CESAREAN SECTION;  Surgeon: Adam Phenix, MD;  Location: [facility]IRTHING SUIT[facility];  Service: Obstetrics;  Laterality: N/A;   CESAREAN SECTION     DILATION AND CURETTAGE OF UTERUS      Allergies and medications reviewed and updated.   Current Outpatient Medications:    albuterol (VENTOLIN HFA) 108 (90 Base) MCG/ACT inhaler, Inhale 2 puffs into the lungs every 6 (six) hours as needed for wheezing or shortness of breath., Disp: 8 g, Rfl: 2   Boric Acid CRYS, Place 600 mg vaginally at bedtime. Use vaginally every night for two weeks then twice a week for six months, Disp: 500 g, Rfl: 5   diclofenac Sodium (VOLTAREN ARTHRITIS PAIN) 1 % GEL, Apply 2 g topically daily., Disp: 100 g, Rfl: 1   Etonogestrel (NEXPLANON [place]), Inject into the skin., Disp: , Rfl:    fluconazole (DIFLUCAN) 150 MG tablet, Take one tablet by mouth every three days for three doses, then take one tablet once a week for six months, Disp: 30 tablet, Rfl: 1   ketoconazole (NIZORAL) 2 % cream, Apply 1 Application topically 2 (two) times daily. To affected areas., Disp: 60  g, Rfl: 3   losartan (COZAAR) 100 MG tablet, TAKE 1 TABLET BY MOUTH EVERYDAY AT BEDTIME, Disp: 90 tablet, Rfl: 1   methocarbamol (ROBAXIN) 500 MG tablet, Take 1 tablet (500 mg total) by mouth 2 (two) times daily., Disp: 20 tablet, Rfl: 0   omeprazole (PRILOSEC) 20 MG capsule, Take 1 capsule (20 mg total) by mouth daily., Disp: 30 capsule, Rfl: 3   polyethylene glycol powder (GLYCOLAX/MIRALAX) 17 GM/SCOOP powder, Take 17 g by mouth daily as needed for mild constipation or moderate constipation., Disp: 3350 g, Rfl: 1   progesterone  (PROMETRIUM) 100 MG capsule, Take 1 capsule (100 mg total) by mouth at bedtime., Disp: 30 capsule, Rfl: 1   Semaglutide-Weight Management (WEGOVY) 0.5 MG/0.5ML SOAJ, Inject 0.5 mg into the skin once a week., Disp: 2 mL, Rfl: 0   triamcinolone (KENALOG) 0.025 % ointment, Apply 1 Application topically 2 (two) times daily., Disp: 30 g, Rfl: 0  Allergies  Allergen Reactions   Flagyl [Metronidazole] Itching   Ibuprofen Swelling    Facial swelling   Nsaids Other (See Comments)    Stomach ulcers   Ondansetron Hcl Other (See Comments)    Pt states "it made me crazy"   Other     Other reaction(s): Other (See Comments) Stomach ulcers   Tramadol Itching   Levofloxacin In D5w Rash and Itching    Objective:   BP (!) 138/94   Pulse 76   Temp 98.6 F (37 C) (Oral)   Ht 5\' 9"  (1.753 m)   Wt 242 lb (109.8 kg)   LMP  (LMP Unknown)   SpO2 98%   BMI 35.74 kg/m      03/10/2023    9:40 AM 03/10/2023    9:36 AM 01/29/2023    3:20 PM  Vitals with BMI  Height  5\' 9"  5\' 9"   Weight  242 lbs 247 lbs  BMI  35.72 36.46  Systolic 138 140 829  Diastolic 94 92 70  Pulse  76 81     Physical Exam Vitals and nursing note reviewed.  Constitutional:      Appearance: Normal appearance. She is normal weight.  HENT:     Head: Normocephalic and atraumatic.  Skin:    General: Skin is warm and dry.     Findings: Lesion present.          Comments: 3 skin tags right neck, 2 RLQ abdomen  Neurological:     General: No focal deficit present.     Mental Status: She is alert and oriented to person, place, and time. Mental status is at baseline.  Psychiatric:        Mood and Affect: Mood normal.        Behavior: Behavior normal.        Thought Content: Thought content normal.        Judgment: Judgment normal.     Assessment & Plan:  Skin tag Assessment & Plan: 4 skin tags removed prior to cryotherapy gun technical difficulties. Unable to complete freezing of 5th tag (superior abdominal). No  complications. Discussed expected course of treatment and when to return to office.       Follow up plan: Return as needed.  Park Meo, FNP

## 2023-03-14 DIAGNOSIS — Z419 Encounter for procedure for purposes other than remedying health state, unspecified: Secondary | ICD-10-CM | POA: Diagnosis not present

## 2023-03-19 ENCOUNTER — Other Ambulatory Visit: Payer: Self-pay

## 2023-03-19 ENCOUNTER — Ambulatory Visit: Payer: Medicaid Other | Admitting: Obstetrics and Gynecology

## 2023-03-19 ENCOUNTER — Other Ambulatory Visit (HOSPITAL_COMMUNITY)
Admission: RE | Admit: 2023-03-19 | Discharge: 2023-03-19 | Disposition: A | Discharge status: Home / Self Care | Source: Ambulatory Visit | Attending: Obstetrics and Gynecology | Admitting: Obstetrics and Gynecology

## 2023-03-19 ENCOUNTER — Encounter: Payer: Self-pay | Admitting: Obstetrics and Gynecology

## 2023-03-19 VITALS — BP 139/93 | HR 77 | Ht 69.0 in | Wt 240.0 lb

## 2023-03-19 DIAGNOSIS — Z124 Encounter for screening for malignant neoplasm of cervix: Secondary | ICD-10-CM

## 2023-03-19 DIAGNOSIS — B9689 Other specified bacterial agents as the cause of diseases classified elsewhere: Secondary | ICD-10-CM | POA: Diagnosis not present

## 2023-03-19 DIAGNOSIS — E669 Obesity, unspecified: Secondary | ICD-10-CM | POA: Diagnosis not present

## 2023-03-19 DIAGNOSIS — N760 Acute vaginitis: Secondary | ICD-10-CM

## 2023-03-19 DIAGNOSIS — Z6835 Body mass index (BMI) 35.0-35.9, adult: Secondary | ICD-10-CM | POA: Diagnosis not present

## 2023-03-19 DIAGNOSIS — Z1151 Encounter for screening for human papillomavirus (HPV): Secondary | ICD-10-CM

## 2023-03-19 DIAGNOSIS — Z01419 Encounter for gynecological examination (general) (routine) without abnormal findings: Secondary | ICD-10-CM

## 2023-03-19 NOTE — Progress Notes (Signed)
 Patient presents for Annual. Requesting refill on Vitamin D 50,000 LMP: periods sometimes w/ Nexplanon Last pap:  11/13/21 WNL due 11/13/24 per chart Contraception: Nexplanon Mammogram: Up to date: 02/27/2023 STD Screening: Accepts   CC:  recurrent BV

## 2023-03-19 NOTE — Progress Notes (Signed)
 Obstetrics and Gynecology New Patient Evaluation  Appointment Date: 03/19/2023  OBGYN Clinic: [facility]  Primary Care Provider: Park Meo  Referring Provider: Park Meo, FNP  Chief Complaint:  Chief Complaint  Patient presents with   Gynecologic Exam    History of Present Illness: Janet [name] is a 38 y.o.  [phone] (No LMP recorded (lmp unknown). Patient has had an implant.), seen for the above chief complaint.   Patient with a h/o chronic BV and would like to hear about potential options; s/s are smell.   Nexplanon expires in May of this year and she has a period/spotting maybe q52m.   Review of Systems: Pertinent items are noted in HPI.   Patient Active Problem List   Diagnosis Date Noted   Skin tag 03/10/2023   Gastroesophageal reflux disease without esophagitis 01/29/2023   Bacterial vaginosis 01/29/2023   Breast tenderness 01/29/2023   Eczema 11/06/2022   Visit for TB skin test 11/06/2022   Essential hypertension 11/06/2022   Vaginal odor 11/06/2022   Dysuria 11/06/2022   Left foot pain 08/11/2022   Fatigue 04/09/2022   Encounter to establish care with new doctor 04/09/2022   Nexplanon in place since 05/17/2020 12/24/2021   Vaginal discharge 11/19/2021   Suicidal ideation 11/19/2021   Mood changes 11/06/2021   Mastalgia 10/25/2021   Soft tissue mass 10/25/2021   Shigella gastroenteritis 08/15/2021   PID (acute pelvic inflammatory disease) 08/14/2021   Headache    Bilateral hearing loss 07/08/2021   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   Essential hypertension, benign 04/24/2020   Depression, recurrent (HCC) 12/20/2019   Vitamin D deficiency 12/20/2019   Insomnia 12/20/2019   Tension headache 06/15/2019   Current moderate episode of major depressive disorder without prior episode (HCC) 04/06/2019   Psychophysiological insomnia 04/06/2019   Chronic bilateral low back pain without sciatica 11/26/2017   Contracture of  finger joint, 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   Benign paroxysmal positional vertigo 02/14/2017   Obesity, Class II, BMI 35-39.9 06/27/2016   Sickle cell trait (HCC) 05/15/2016   History of gestational hypertension 04/24/2016   Class 2 severe obesity due to excess calories with serious comorbidity and body mass index (BMI) of 37.0 to 37.9 in adult [facility]) 04/24/2016    Past Medical History:  Past Medical History:  Diagnosis Date   Abdominal pain 05/06/2021   Anemia    Asthma    Bilateral knee pain 10/22/2020   Depression    Finger pain, left 11/26/2017   Gestational diabetes mellitus, antepartum 08/12/2016   Moved pt to BRX GDM program Growth [place] at 38 week EFW 7#13oz, 82nd%   Left wrist pain 06/18/2020   Migraines    Right wrist pain 11/26/2017   Skin tag 05/11/2020    Past Surgical History:  Past Surgical History:  Procedure Laterality Date   CESAREAN SECTION N/A 10/17/2016   Procedure: REPEAT CESAREAN SECTION;  Surgeon: Adam Phenix, MD;  Location: [facility] BIRTHING SUITES;  Service: Obstetrics;  Laterality: N/A;   CESAREAN SECTION     DILATION AND CURETTAGE OF UTERUS      Past Obstetrical History:  OB History  Gravida Para Term Preterm AB Living  3 2 2  0 1 3  SAB IAB Ectopic Multiple Live Births  1 0 0 1 3    # Outcome Date GA Lbr Len/2nd Weight Sex Type Anes PTL Lv  3 Term 10/17/16 [redacted]w[redacted]d  7 lb 3.9 oz (3.285 kg) F CS-Vac Spinal  LIV  2A Term 12/22/07  M CS-LTranv   LIV     Birth Comments: gestational hypertension     Complications: Twins  2B Term 12/22/07    M CS-LTranv   LIV  1 SAB  [redacted]w[redacted]d          Past Gynecological History: As per HPI. History of Pap Smear(s): Yes.   Last pap 11/2021, which was negative and hpv negative.   Social History:  Social History   Socioeconomic History   Marital status: Married    Spouse name: [name]   Number of children: 3   Years of education: college   Highest education level: Bachelor's degree (e.g., BA, AB, BS)  Occupational History    Not on file  Tobacco Use   Smoking status: Never   Smokeless tobacco: Never  Vaping Use   Vaping status: Never Used  Substance and Sexual Activity   Alcohol use: Not Currently    Comment: once every few months   Drug use: No   Sexual activity: Yes    Partners: Male    Birth control/protection: Implant  Other Topics Concern   Not on file  Social History Narrative   04/06/19   From: [place], moved to the [place] in 2016   Living: with [name] (husband since 2017)   Work: stay at home mom      Family: Dorinda Hill and Special educational needs teacher (twins 2010) and Merdis Delay (2019), and 3 step children - Amblessed, holy, Ventura Sellers      Enjoys: sing professionally, reading      Exercise: goes to the gym when she can - 1-2 times a week   Diet: keto diet      Safety   Seat belts: Yes    Guns: No   Safe in relationships: Yes    Social Drivers of Corporate investment banker Strain: Medium Risk (12/01/2022)   Overall Financial Resource Strain (CARDIA)    Difficulty of Paying Living Expenses: Somewhat hard  Food Insecurity: Food Insecurity Present (12/01/2022)   Hunger Vital Sign    Worried About Running Out of Food in the Last Year: Often true    Ran Out of Food in the Last Year: Often true  Transportation Needs: No Transportation Needs (12/01/2022)   PRAPARE - Administrator, Civil Service (Medical): No    Lack of Transportation (Non-Medical): No  Physical Activity: Unknown (12/01/2022)   Exercise Vital Sign    Days of Exercise per Week: 0 days    Minutes of Exercise per Session: Not on file  Stress: Stress Concern Present (12/01/2022)   [employer] of Occupational Health - Occupational Stress Questionnaire    Feeling of Stress : Very much  Social Connections: Unknown (12/01/2022)   Social Connection and Isolation Panel [NHANES]    Frequency of Communication with Friends and Family: Once a week    Frequency of Social Gatherings with Friends and Family: Never    Attends Religious  Services: More than 4 times per year    Active Member of [employer] or Organizations: No    Attends Engineer, structural: Not on file    Marital Status: Patient declined  Catering manager Violence: Not on file    Family History:  Family History  Problem Relation Age of Onset   Hypertension Mother    Heart disease Mother    Diabetes Father    Breast cancer Maternal Aunt 70   Sleep apnea Neg Hx     Medications Kella Splinter had no medications administered during this visit.  Current Outpatient Medications  Medication Sig Dispense Refill   Etonogestrel (NEXPLANON [place]) Inject into the skin.     progesterone (PROMETRIUM) 100 MG capsule Take 1 capsule (100 mg total) by mouth at bedtime. 30 capsule 1   Semaglutide-Weight Management (WEGOVY) 0.5 MG/0.5ML SOAJ Inject 0.5 mg into the skin once a week. 2 mL 0   albuterol (VENTOLIN HFA) 108 (90 Base) MCG/ACT inhaler Inhale 2 puffs into the lungs every 6 (six) hours as needed for wheezing or shortness of breath. 8 g 2   Boric Acid CRYS Place 600 mg vaginally at bedtime. Use vaginally every night for two weeks then twice a week for six months (Patient not taking: Reported on 03/19/2023) 500 g 5   diclofenac Sodium (VOLTAREN ARTHRITIS PAIN) 1 % GEL Apply 2 g topically daily. (Patient not taking: Reported on 03/19/2023) 100 g 1   fluconazole (DIFLUCAN) 150 MG tablet Take one tablet by mouth every three days for three doses, then take one tablet once a week for six months 30 tablet 1   ketoconazole (NIZORAL) 2 % cream Apply 1 Application topically 2 (two) times daily. To affected areas. 60 g 3   losartan (COZAAR) 100 MG tablet TAKE 1 TABLET BY MOUTH EVERYDAY AT BEDTIME 90 tablet 1   methocarbamol (ROBAXIN) 500 MG tablet Take 1 tablet (500 mg total) by mouth 2 (two) times daily. 20 tablet 0   omeprazole (PRILOSEC) 20 MG capsule Take 1 capsule (20 mg total) by mouth daily. 30 capsule 3   polyethylene glycol powder (GLYCOLAX/MIRALAX) 17 GM/SCOOP powder  Take 17 g by mouth daily as needed for mild constipation or moderate constipation. 3350 g 1   triamcinolone (KENALOG) 0.025 % ointment Apply 1 Application topically 2 (two) times daily. 30 g 0   No current facility-administered medications for this visit.    Allergies Flagyl [metronidazole], Ibuprofen, Nsaids, Ondansetron hcl, Other, Tramadol, and Levofloxacin in d5w   Physical Exam:  BP (!) 139/93   Pulse 77   Ht 5\' 9"  (1.753 m)   Wt 240 lb (108.9 kg)   LMP  (LMP Unknown)   BMI 35.44 kg/m  Body mass index is 35.44 kg/m.  General appearance: Well nourished, well developed female in no acute distress.  Neck:  Supple, normal appearance, and no thyromegaly  Cardiovascular: normal s1 and s2.  No murmurs, rubs or gallops. Respiratory:  Clear to auscultation bilateral. Normal respiratory effort Abdomen: positive bowel sounds and no masses, hernias; diffusely non tender to palpation, non distended Breasts: breasts appear normal, no suspicious masses, no skin or nipple changes or axillary nodes, and normal palpation. Neuro/Psych:  Normal mood and affect.  Skin:  Warm and dry.  Lymphatic:  No inguinal lymphadenopathy.   Cervical exam performed in the presence of a chaperone Pelvic exam: is not limited by body habitus EGBUS: within normal limits Vagina: within normal limits and with no blood or discharge in the vault Cervix: normal appearing cervix without tenderness, discharge or lesions. Uterus:  nonenlarged and non tender Adnexa:  normal adnexa and no mass, fullness, tenderness Rectovaginal: deferred  Laboratory: none  Radiology: none  Assessment: patient stable  Plan:  1. Bacterial vaginosis (Primary) Follow up swab. D/w her re: quarter cup to half cup at bedtime of various fermented foods drinks to help with her microbiome, which will take months to see if effective but also has other benefits. I d/w her re: her boric acid at home and deadly if orally consumed and it keep it  in a safe area. It sounds like the BA worked in the past when she did it for six months but then it came back. I d/w her that may need to do it again and stay on it chronically twice a week for a longer amount of time - Cervicovaginal ancillary only( [place])  2. Cervical cancer screening - Cytology - PAP  3. Well woman exam with routine gynecological exam Would like to wait until fully expired to replace nexplanon.   Return for 2-32m in person with dr Vergie Living. nexplanon replacement and f/u BV.  Future Appointments  Date Time Provider Department Center  05/26/2023  9:30 AM Terri Piedra, DO [facility] None    Cornelia Copa MD Attending [facility] for [facility])

## 2023-03-20 LAB — CERVICOVAGINAL ANCILLARY ONLY
Bacterial Vaginitis (gardnerella): NEGATIVE
Candida Glabrata: NEGATIVE
Candida Vaginitis: NEGATIVE
Chlamydia: NEGATIVE
Comment: NEGATIVE
Comment: NEGATIVE
Comment: NEGATIVE
Comment: NEGATIVE
Comment: NEGATIVE
Comment: NORMAL
Neisseria Gonorrhea: NEGATIVE
Trichomonas: NEGATIVE

## 2023-03-23 ENCOUNTER — Other Ambulatory Visit (HOSPITAL_COMMUNITY): Payer: Self-pay

## 2023-03-23 ENCOUNTER — Encounter: Payer: Self-pay | Admitting: Obstetrics and Gynecology

## 2023-03-23 LAB — CYTOLOGY - PAP
Adequacy: ABSENT
Comment: NEGATIVE
Diagnosis: NEGATIVE
High risk HPV: NEGATIVE

## 2023-04-02 ENCOUNTER — Other Ambulatory Visit (HOSPITAL_COMMUNITY): Payer: Self-pay

## 2023-04-15 ENCOUNTER — Other Ambulatory Visit (HOSPITAL_COMMUNITY): Payer: Self-pay

## 2023-04-22 ENCOUNTER — Encounter: Payer: Self-pay | Admitting: Family Medicine

## 2023-04-22 ENCOUNTER — Ambulatory Visit: Admitting: Family Medicine

## 2023-04-22 VITALS — BP 124/86 | HR 88 | Temp 98.5°F | Ht 69.0 in | Wt 240.0 lb

## 2023-04-22 DIAGNOSIS — M25551 Pain in right hip: Secondary | ICD-10-CM

## 2023-04-22 MED ORDER — METHOCARBAMOL 750 MG PO TABS: 1500.0000 mg | ORAL_TABLET | Freq: Three times a day (TID) | ORAL | 0 refills | Status: AC | PRN

## 2023-04-22 NOTE — Assessment & Plan Note (Signed)
 No red flags on exam. Pain with flexion and external rotation of hip. She does have a new job that she is required to be on her feet much more. I suspect MSK etiology given spasming nature of the pain in her thigh and calf. Encouraged stretching. Will try robaxin PRN, continue heat and Tylenol. Return to office if symptoms persist.

## 2023-04-22 NOTE — Progress Notes (Signed)
 Subjective:  HPI: Janet [name] is a 38 y.o. female presenting on 04/22/2023 for Muscle Pain (R leg pain x 6 weeks. Pt states the pain is excruciating. )   Muscle Pain   Patient is in today for 6 weeks of RLE pain that starts in the front of her right hip and goes to her calf. The pain is worse after working and when she lies down. The pain is throbbing, spasms and pulsing. Worse with hip flexion and external rotation. No weakness, numbness, tingling, swelling, redness, warmth, trauma, fall, injury.   Review of Systems  Musculoskeletal:  Positive for joint pain. Negative for back pain and falls.  All other systems reviewed and are negative.   Relevant past medical history reviewed and updated as indicated.   Past Medical History:  Diagnosis Date   Abdominal pain 05/06/2021   Anemia    Asthma    Bilateral knee pain 10/22/2020   Depression    Finger pain, left 11/26/2017   Gestational diabetes mellitus, antepartum 08/12/2016   Moved pt to BRX GDM program Growth [place] at 38 week EFW 7#13oz, 82nd%   Left wrist pain 06/18/2020   Migraines    Right wrist pain 11/26/2017   Skin tag 05/11/2020     Past Surgical History:  Procedure Laterality Date   CESAREAN SECTION N/A 10/17/2016   Procedure: REPEAT CESAREAN SECTION;  Surgeon: Adam Phenix, MD;  Location: [facility] BIRTHING SUITES;  Service: Obstetrics;  Laterality: N/A;   CESAREAN SECTION     DILATION AND CURETTAGE OF UTERUS      Allergies and medications reviewed and updated.   Current Outpatient Medications:    albuterol (VENTOLIN HFA) 108 (90 Base) MCG/ACT inhaler, Inhale 2 puffs into the lungs every 6 (six) hours as needed for wheezing or shortness of breath., Disp: 8 g, Rfl: 2   Boric Acid CRYS, Place 600 mg vaginally at bedtime. Use vaginally every night for two weeks then twice a week for six months, Disp: 500 g, Rfl: 5   diclofenac Sodium (VOLTAREN ARTHRITIS PAIN) 1 % GEL, Apply 2 g topically daily., Disp: 100 g, Rfl: 1    Etonogestrel (NEXPLANON [place]), Inject into the skin., Disp: , Rfl:    fluconazole (DIFLUCAN) 150 MG tablet, Take one tablet by mouth every three days for three doses, then take one tablet once a week for six months, Disp: 30 tablet, Rfl: 1   ketoconazole (NIZORAL) 2 % cream, Apply 1 Application topically 2 (two) times daily. To affected areas., Disp: 60 g, Rfl: 3   losartan (COZAAR) 100 MG tablet, TAKE 1 TABLET BY MOUTH EVERYDAY AT BEDTIME, Disp: 90 tablet, Rfl: 1   methocarbamol (ROBAXIN-750) 750 MG tablet, Take 2 tablets (1,500 mg total) by mouth every 8 (eight) hours as needed for up to 7 days for muscle spasms., Disp: 42 tablet, Rfl: 0   omeprazole (PRILOSEC) 20 MG capsule, Take 1 capsule (20 mg total) by mouth daily., Disp: 30 capsule, Rfl: 3   polyethylene glycol powder (GLYCOLAX/MIRALAX) 17 GM/SCOOP powder, Take 17 g by mouth daily as needed for mild constipation or moderate constipation., Disp: 3350 g, Rfl: 1   progesterone (PROMETRIUM) 100 MG capsule, Take 1 capsule (100 mg total) by mouth at bedtime., Disp: 30 capsule, Rfl: 1   Semaglutide-Weight Management (WEGOVY) 0.5 MG/0.5ML SOAJ, Inject 0.5 mg into the skin once a week., Disp: 2 mL, Rfl: 0   triamcinolone (KENALOG) 0.025 % ointment, Apply 1 Application topically 2 (two) times daily., Disp: 30  g, Rfl: 0  Allergies  Allergen Reactions   Flagyl [Metronidazole] Itching   Ibuprofen Swelling    Facial swelling   Nsaids Other (See Comments)    Stomach ulcers   Ondansetron Hcl Other (See Comments)    Pt states "it made me crazy"   Other     Other reaction(s): Other (See Comments) Stomach ulcers   Tramadol Itching   Levofloxacin In D5w Rash and Itching    Objective:   BP 124/86   Pulse 88   Temp 98.5 F (36.9 C)   Ht 5\' 9"  (1.753 m)   Wt 240 lb (108.9 kg)   SpO2 99%   BMI 35.44 kg/m      04/22/2023    3:54 PM 03/19/2023    8:29 AM 03/10/2023    9:40 AM  Vitals with BMI  Height 5\' 9"  5\' 9"    Weight 240 lbs 240 lbs   BMI  35.43 35.43   Systolic 124 139 161  Diastolic 86 93 94  Pulse 88 77      Physical Exam Vitals and nursing note reviewed.  Constitutional:      Appearance: Normal appearance. She is normal weight.  HENT:     Head: Normocephalic and atraumatic.  Musculoskeletal:     Right hip: Tenderness present.  Skin:    General: Skin is warm and dry.  Neurological:     General: No focal deficit present.     Mental Status: She is alert and oriented to person, place, and time. Mental status is at baseline.  Psychiatric:        Mood and Affect: Mood normal.        Behavior: Behavior normal.        Thought Content: Thought content normal.        Judgment: Judgment normal.     Assessment & Plan:  Pain of right hip Assessment & Plan: No red flags on exam. Pain with flexion and external rotation of hip. She does have a new job that she is required to be on her feet much more. I suspect MSK etiology given spasming nature of the pain in her thigh and calf. Encouraged stretching. Will try robaxin PRN, continue heat and Tylenol. Return to office if symptoms persist.    Other orders -     Methocarbamol; Take 2 tablets (1,500 mg total) by mouth every 8 (eight) hours as needed for up to 7 days for muscle spasms.  Dispense: 42 tablet; Refill: 0     Follow up plan: Return if symptoms worsen or fail to improve.  Park Meo, FNP

## 2023-04-24 ENCOUNTER — Ambulatory Visit
Admission: RE | Admit: 2023-04-24 | Discharge: 2023-04-24 | Disposition: A | Discharge status: Home / Self Care | Source: Ambulatory Visit | Attending: Physician Assistant | Admitting: Physician Assistant

## 2023-04-24 ENCOUNTER — Other Ambulatory Visit: Payer: Self-pay | Admitting: Physician Assistant

## 2023-04-24 ENCOUNTER — Ambulatory Visit
Admission: RE | Admit: 2023-04-24 | Discharge: 2023-04-24 | Disposition: A | Discharge status: Home / Self Care | Attending: Physician Assistant | Admitting: Physician Assistant

## 2023-04-24 DIAGNOSIS — Y990 Civilian activity done for income or pay: Secondary | ICD-10-CM

## 2023-04-24 DIAGNOSIS — M1711 Unilateral primary osteoarthritis, right knee: Secondary | ICD-10-CM | POA: Insufficient documentation

## 2023-04-24 DIAGNOSIS — W2209XA Striking against other stationary object, initial encounter: Secondary | ICD-10-CM | POA: Diagnosis not present

## 2023-04-24 DIAGNOSIS — M25561 Pain in right knee: Secondary | ICD-10-CM | POA: Insufficient documentation

## 2023-04-25 DIAGNOSIS — Z419 Encounter for procedure for purposes other than remedying health state, unspecified: Secondary | ICD-10-CM | POA: Diagnosis not present

## 2023-05-01 ENCOUNTER — Ambulatory Visit: Payer: Self-pay

## 2023-05-01 NOTE — Telephone Encounter (Signed)
 Copied from CRM [id number]. Topic: Clinical - Pink Word Triage >> May 01, 2023  2:49 PM Sasha H wrote: Reason for Triage: pt called in saying she is vomiting, having diarrhea , and feeling feverish  Chief Complaint: n/v/d Symptoms: vomiting 5 times today and diarrhea 5 times Frequency: since today Pertinent Negatives: Patient denies blood in vomit/diarrhea Disposition: [x] ED /[] Urgent Care (no appt availability in office) / [] Appointment(In office/virtual)/ []  [facility]/ [] Home Care/ [] Refused Recommended Disposition /[] [facility]/ []  Follow-up with PCP Additional Notes: Instructed to go to ER.  Pcp office updated.   Reason for Disposition  [1] MODERATE vomiting (e.g., 3 - 5 times/day) AND [2] age > 60 years  Answer Assessment - Initial Assessment Questions 1. VOMITING SEVERITY: "How many times have you vomited in the past 24 hours?"     - MILD:  1 - 2 times/day    - MODERATE: 3 - 5 times/day, decreased oral intake without significant weight loss or symptoms of dehydration    - SEVERE: 6 or more times/day, vomits everything or nearly everything, with significant weight loss, symptoms of dehydration      5 2. ONSET: "When did the vomiting begin?"      Two am 3. FLUIDS: "What fluids or food have you vomited up today?" "Have you been able to keep any fluids down?"     yes 4. ABDOMEN PAIN: "Are your having any abdomen pain?" If Yes : "How bad is it and what does it feel like?" (e.g., crampy, dull, intermittent, constant)      yes 5. DIARRHEA: "Is there any diarrhea?" If Yes, ask: "How many times today?"      yes 6. CONTACTS: "Is there anyone else in the family with the same symptoms?"      daughter 2. CAUSE: "What do you think is causing your vomiting?"     unknown 8. HYDRATION STATUS: "Any signs of dehydration?" (e.g., dry mouth [not only dry lips], too weak to stand) "When did you last urinate?"     Lips dry and mouth dry 9. OTHER SYMPTOMS: "Do you have any  other symptoms?" (e.g., fever, headache, vertigo, vomiting blood or coffee grounds, recent head injury)     Fever,body ache, chills, weak 10. PREGNANCY: "Is there any chance you are pregnant?" "When was your last menstrual period?"       no  Protocols used: Vomiting-A-AH

## 2023-05-25 DIAGNOSIS — Z419 Encounter for procedure for purposes other than remedying health state, unspecified: Secondary | ICD-10-CM | POA: Diagnosis not present

## 2023-05-25 DIAGNOSIS — L219 Seborrheic dermatitis, unspecified: Secondary | ICD-10-CM | POA: Diagnosis not present

## 2023-05-26 ENCOUNTER — Other Ambulatory Visit: Payer: Self-pay | Admitting: "Endocrinology

## 2023-05-26 ENCOUNTER — Ambulatory Visit: Payer: Medicaid Other | Admitting: Dermatology

## 2023-05-28 ENCOUNTER — Encounter (HOSPITAL_COMMUNITY): Payer: Self-pay

## 2023-05-28 ENCOUNTER — Other Ambulatory Visit (HOSPITAL_COMMUNITY): Payer: Self-pay

## 2023-06-09 ENCOUNTER — Telehealth: Payer: Self-pay | Admitting: "Endocrinology

## 2023-06-09 NOTE — Telephone Encounter (Signed)
Can you put in labs for pt?

## 2023-06-10 ENCOUNTER — Other Ambulatory Visit: Payer: Self-pay | Admitting: "Endocrinology

## 2023-06-10 DIAGNOSIS — E66812 Obesity, class 2: Secondary | ICD-10-CM

## 2023-06-17 ENCOUNTER — Ambulatory Visit: Admitting: Family Medicine

## 2023-06-17 ENCOUNTER — Encounter: Payer: Self-pay | Admitting: Family Medicine

## 2023-06-17 VITALS — BP 118/82 | HR 73 | Temp 98.4°F | Ht 69.0 in | Wt 247.2 lb

## 2023-06-17 DIAGNOSIS — Z1321 Encounter for screening for nutritional disorder: Secondary | ICD-10-CM | POA: Diagnosis not present

## 2023-06-17 DIAGNOSIS — Z1329 Encounter for screening for other suspected endocrine disorder: Secondary | ICD-10-CM | POA: Diagnosis not present

## 2023-06-17 DIAGNOSIS — Z0000 Encounter for general adult medical examination without abnormal findings: Secondary | ICD-10-CM | POA: Diagnosis not present

## 2023-06-17 DIAGNOSIS — Z13220 Encounter for screening for lipoid disorders: Secondary | ICD-10-CM

## 2023-06-17 DIAGNOSIS — I10 Essential (primary) hypertension: Secondary | ICD-10-CM

## 2023-06-17 DIAGNOSIS — K219 Gastro-esophageal reflux disease without esophagitis: Secondary | ICD-10-CM

## 2023-06-17 DIAGNOSIS — L309 Dermatitis, unspecified: Secondary | ICD-10-CM | POA: Diagnosis not present

## 2023-06-17 DIAGNOSIS — J45909 Unspecified asthma, uncomplicated: Secondary | ICD-10-CM

## 2023-06-17 DIAGNOSIS — Z0001 Encounter for general adult medical examination with abnormal findings: Secondary | ICD-10-CM

## 2023-06-17 MED ORDER — TIRZEPATIDE-WEIGHT MANAGEMENT 2.5 MG/0.5ML ~~LOC~~ SOLN: 2.5000 mg | SUBCUTANEOUS | 0 refills | Status: AC

## 2023-06-17 MED ORDER — LOSARTAN POTASSIUM 100 MG PO TABS: 100.0000 mg | ORAL_TABLET | Freq: Every day | ORAL | 1 refills | Status: AC

## 2023-06-17 MED ORDER — ALBUTEROL SULFATE HFA 108 (90 BASE) MCG/ACT IN AERS
2.0000 | INHALATION_SPRAY | Freq: Four times a day (QID) | RESPIRATORY_TRACT | 2 refills | Status: AC | PRN
Start: 2023-06-17 — End: ?

## 2023-06-17 NOTE — Assessment & Plan Note (Signed)
 Well controlled on Losartan  100mg  daily. Recommend heart healthy diet such as Mediterranean diet with whole grains, fruits, vegetable, fish, lean meats, nuts, and olive oil. Limit salt. Encouraged moderate walking, 3-5 times/week for 30-50 minutes each session. Aim for at least 150 minutes.week. Goal should be pace of 3 miles/hours, or walking 1.5 miles in 30 minutes. Avoid tobacco products. Avoid excess alcohol. Take medications as prescribed and bring medications and blood pressure log with cuff to each office visit. Seek medical care for chest pain, palpitations, shortness of breath with exertion, dizziness/lightheadedness, vision changes, recurrent headaches, or swelling of extremities.  Follow up in 6 months

## 2023-06-17 NOTE — Assessment & Plan Note (Signed)
 Established with dermatology of [facility]

## 2023-06-17 NOTE — Progress Notes (Signed)
 Complete physical exam  Patient: Janet [name]   DOB: 1985/08/14   38 y.o. Female  MRN: [id number]  Subjective:     Chief Complaint  Patient presents with   Annual Exam    Physical     Janet [name] is a 38 y.o. female who presents today for a complete physical exam. She reports consuming a general diet. The patient does not participate in regular exercise at present. She generally feels fairly well. She reports sleeping fairly well. She does not have additional problems to discuss today.   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 1  Number falls in past yr: 0  Injury with Fall? 1  Risk for fall due to : No Fall Risks  Follow up Falls evaluation completed     Most recent depression screenings:    06/17/2023    2:51 PM 03/19/2023    8:44 AM  PHQ 2/9 Scores  PHQ - 2 Score 4 6  PHQ- 9 Score 11 18    Vision:Within last year and Dental: No current dental problems and Receives regular dental care  Patient Active Problem List   Diagnosis Date Noted   Physical exam, annual 06/17/2023   Uncomplicated asthma 06/17/2023   Pain of right hip 04/22/2023   Skin tag 03/10/2023   Gastroesophageal reflux disease without esophagitis 01/29/2023   Bacterial vaginosis 01/29/2023   Breast tenderness 01/29/2023   Eczema 11/06/2022   Visit for TB skin test 11/06/2022   Essential hypertension 11/06/2022   Vaginal odor 11/06/2022   Dysuria 11/06/2022   Left foot pain  08/11/2022   Fatigue 04/09/2022   Encounter to establish care with new doctor 04/09/2022   Nexplanon  in place since 05/17/2020 12/24/2021   Vaginal discharge 11/19/2021   Suicidal ideation 11/19/2021   Mood changes 11/06/2021   Mastalgia 10/25/2021   Soft tissue mass 10/25/2021   Shigella gastroenteritis 08/15/2021   PID (acute pelvic inflammatory disease) 08/14/2021   Headache    Bilateral hearing loss 07/08/2021   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   Essential hypertension, benign 04/24/2020   Depression, recurrent (HCC) 12/20/2019   Vitamin D  deficiency 12/20/2019   Insomnia 12/20/2019   Tension headache 06/15/2019   Current moderate episode of major depressive disorder without prior episode (HCC) 04/06/2019   Psychophysiological insomnia 04/06/2019   Chronic bilateral low back pain without sciatica 11/26/2017   Contracture of finger joint, 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   Benign paroxysmal positional vertigo 02/14/2017   Obesity, Class II, BMI 35-39.9 06/27/2016   Sickle cell trait (HCC) 05/15/2016   History of gestational hypertension 04/24/2016   Class 2 severe obesity due to excess calories with serious comorbidity and body mass index (BMI) of 37.0 to 37.9 in adult [facility]) 04/24/2016   Past Medical History:  Diagnosis Date   Abdominal pain 05/06/2021   Anemia    Asthma    Bilateral knee pain 10/22/2020   Depression    Finger pain, left 11/26/2017  Gestational diabetes mellitus, antepartum 08/12/2016   Moved pt to BRX GDM program Growth US  at 38 week EFW 7#13oz, 82nd%   Left wrist pain 06/18/2020   Migraines    Right wrist pain 11/26/2017   Skin tag 05/11/2020   Past Surgical History:  Procedure Laterality Date   CESAREAN SECTION N/A 10/17/2016   Procedure: REPEAT CESAREAN SECTION;  Surgeon: Tresia Fruit, MD;  Location: [facility] BIRTHING SUITES;  Service: Obstetrics;  Laterality: N/A;   CESAREAN SECTION     DILATION AND CURETTAGE OF UTERUS     Social History   Tobacco Use    Smoking status: Never   Smokeless tobacco: Never  Vaping Use   Vaping status: Never Used  Substance Use Topics   Alcohol use: Not Currently    Comment: once every few months   Drug use: No   Family History  Problem Relation Age of Onset   Hypertension Mother    Heart disease Mother    Diabetes Father    Breast cancer Maternal Aunt 70   Sleep apnea Neg Hx    Allergies  Allergen Reactions   Ibuprofen Swelling    Facial swelling   Nsaids Other (See Comments)    Stomach ulcers   Ondansetron  Hcl Other (See Comments)    Pt states "it made me crazy"  Pt states "it made me crazy"    Pt states "it made me crazy" Pt states "it made me crazy"   Other Other (See Comments)    Other reaction(s): Other (See Comments)  Stomach ulcers  Other reaction(s): Other (See Comments) Stomach ulcers    Other reaction(s): Other (See Comments) Stomach ulcers   Levofloxacin In D5w Rash and Itching   Metronidazole  Itching   Tramadol  Itching      Patient Care Team: Jenelle Mis, FNP as PCP - General (Family Medicine)   Outpatient Medications Prior to Visit  Medication Sig   diclofenac  Sodium (VOLTAREN  ARTHRITIS PAIN) 1 % GEL Apply 2 g topically daily.   Etonogestrel  (NEXPLANON  [place]) Inject into the skin.   methocarbamol  (ROBAXIN ) 750 MG tablet Take 750 mg by mouth every 6 (six) hours as needed for muscle spasms.   polyethylene glycol powder (GLYCOLAX /MIRALAX ) 17 GM/SCOOP powder Take 17 g by mouth daily as needed for mild constipation or moderate constipation.   progesterone  (PROMETRIUM ) 100 MG capsule Take 1 capsule (100 mg total) by mouth at bedtime.   triamcinolone  (KENALOG ) 0.025 % ointment Apply 1 Application topically 2 (two) times daily.   [DISCONTINUED] albuterol  (VENTOLIN  HFA) 108 (90 Base) MCG/ACT inhaler Inhale 2 puffs into the lungs every 6 (six) hours as needed for wheezing or shortness of breath.   [DISCONTINUED] Boric Acid CRYS Place 600 mg vaginally at bedtime. Use vaginally  every night for two weeks then twice a week for six months   [DISCONTINUED] fluconazole  (DIFLUCAN ) 150 MG tablet Take one tablet by mouth every three days for three doses, then take one tablet once a week for six months   [DISCONTINUED] ketoconazole  (NIZORAL ) 2 % cream Apply 1 Application topically 2 (two) times daily. To affected areas.   [DISCONTINUED] losartan  (COZAAR ) 100 MG tablet TAKE 1 TABLET BY MOUTH EVERYDAY AT BEDTIME   [DISCONTINUED] omeprazole  (PRILOSEC) 20 MG capsule Take 1 capsule (20 mg total) by mouth daily.   [DISCONTINUED] Semaglutide -Weight Management (WEGOVY ) 0.5 MG/0.5ML SOAJ Inject 0.5 mg into the skin once a week.   No facility-administered medications prior to visit.    Review of Systems  Constitutional:  Positive for malaise/fatigue.  HENT: Negative.    Eyes: Negative.   Respiratory: Negative.    Cardiovascular: Negative.   Gastrointestinal:  Positive for constipation.  Genitourinary: Negative.   Musculoskeletal:  Positive for joint pain.  Skin: Negative.   Neurological:  Positive for headaches.  Endo/Heme/Allergies: Negative.   Psychiatric/Behavioral: Negative.    All other systems reviewed and are negative.         Objective:     BP 118/82   Pulse 73   Temp 98.4 F (36.9 C)   Ht 5\' 9"  (1.753 m)   Wt 247 lb 3.2 oz (112.1 kg)   SpO2 98%   BMI 36.51 kg/m  BP Readings from Last 3 Encounters:  06/17/23 118/82  04/22/23 124/86  03/19/23 (!) 139/93   Wt Readings from Last 3 Encounters:  06/17/23 247 lb 3.2 oz (112.1 kg)  04/22/23 240 lb (108.9 kg)  03/19/23 240 lb (108.9 kg)      Physical Exam Vitals and nursing note reviewed.  Constitutional:      Appearance: Normal appearance. She is obese.  HENT:     Head: Normocephalic and atraumatic.     Right Ear: Tympanic membrane, ear canal and external ear normal.     Left Ear: Tympanic membrane, ear canal and external ear normal.     Nose: Nose normal.     Mouth/Throat:     Mouth: Mucous  membranes are moist.     Pharynx: Oropharynx is clear.  Eyes:     Extraocular Movements: Extraocular movements intact.     Conjunctiva/sclera: Conjunctivae normal.     Pupils: Pupils are equal, round, and reactive to light.  Cardiovascular:     Rate and Rhythm: Normal rate and regular rhythm.     Pulses: Normal pulses.     Heart sounds: Normal heart sounds.  Pulmonary:     Effort: Pulmonary effort is normal.     Breath sounds: Normal breath sounds.  Abdominal:     General: Bowel sounds are normal.     Palpations: Abdomen is soft.  Musculoskeletal:        General: Normal range of motion.     Cervical back: Normal range of motion and neck supple.  Skin:    General: Skin is warm and dry.     Capillary Refill: Capillary refill takes less than 2 seconds.  Neurological:     General: No focal deficit present.     Mental Status: She is alert and oriented to person, place, and time. Mental status is at baseline.  Psychiatric:        Mood and Affect: Mood normal.        Behavior: Behavior normal.        Thought Content: Thought content normal.        Judgment: Judgment normal.      No results found for any visits on 06/17/23.     Assessment & Plan:    Routine Health Maintenance and Physical Exam  Immunization History  Administered Date(s) Administered   Hepatitis B 06/03/2021   Influenza, Seasonal, Injecte, Preservative Fre 10/21/2022   Influenza,inj,Quad PF,6+ Mos 10/08/2016, 11/19/2017, 12/20/2019, 11/05/2020, 11/06/2021   MMR 05/29/2021   PFIZER(Purple Top)SARS-COV-2 Vaccination 04/14/2019, 05/14/2019, 12/20/2019   PNEUMOCOCCAL CONJUGATE-20 01/29/2023   Pfizer Covid-19 Vaccine Bivalent Booster 10yrs & up 01/09/2021   Tdap 07/24/2016    Health Maintenance  Topic Date Due   COVID-19 Vaccine (5 - 2024-25 season) 07/03/2023 (Originally 09/14/2022)   INFLUENZA VACCINE  08/14/2023  Cervical Cancer Screening (HPV/Pap Cotest)  03/19/2026   DTaP/Tdap/Td (2 - Td or Tdap)  07/25/2026   Pneumococcal Vaccine 20-65 Years old  Completed   Hepatitis C Screening  Completed   HIV Screening  Completed   HPV VACCINES  Aged Out   Meningococcal B Vaccine  Aged Out    Discussed health benefits of physical activity, and encouraged her to engage in regular exercise appropriate for her age and condition.  Problem List Items Addressed This Visit     Eczema   Established with [facility]ology of [facility]      Essential hypertension   Well controlled on Losartan  100mg  daily. Recommend heart healthy diet such as Mediterranean diet with whole grains, fruits, vegetable, fish, lean meats, nuts, and olive oil. Limit salt. Encouraged moderate walking, 3-5 times/week for 30-50 minutes each session. Aim for at least 150 minutes.week. Goal should be pace of 3 miles/hours, or walking 1.5 miles in 30 minutes. Avoid tobacco products. Avoid excess alcohol. Take medications as prescribed and bring medications and blood pressure log with cuff to each office visit. Seek medical care for chest pain, palpitations, shortness of breath with exertion, dizziness/lightheadedness, vision changes, recurrent headaches, or swelling of extremities.  Follow up in 6 months      Relevant Medications   losartan  (COZAAR ) 100 MG tablet   Gastroesophageal reflux disease without esophagitis   Diet controlled. Elevated HOB if needed and avoid lying down 2-3 hours after eating, avoid coffee, alcohol, chocolate, fatty foods, citrus, carbonated beverages, spicy foods, late meals, and smoking. Return to office if symptoms return or worsen and seek medical care for difficulty swallowing, bleeding, anemia, weight loss, recurrent vomiting        Physical exam, annual - Primary   Today your medical history was reviewed and routine physical exam with labs was performed. Recommend 150 minutes of moderate intensity exercise weekly and consuming a well-balanced diet. Advised to stop smoking if a smoker, avoid smoking if a  non-smoker, limit alcohol consumption to 1 drink per day for women and 2 drinks per day for men, and avoid illicit drug use. Counseled in mental health awareness and when to seek medical care. Vaccine maintenance discussed. Appropriate health maintenance items reviewed. Return to office in 1 year for annual physical exam.       Relevant Orders   CBC with Differential/Platelet   Comprehensive metabolic panel with GFR   Lipid panel   TSH   Hemoglobin A1c   VITAMIN D  25 Hydroxy (Vit-D Deficiency, Fractures)   Vitamin B12   Uncomplicated asthma   No exacerbations. Well controlled by avoiding triggers and rare albuterol  use.       Relevant Medications   albuterol  (VENTOLIN  HFA) 108 (90 Base) MCG/ACT inhaler   Other Visit Diagnoses       Encounter for vitamin deficiency screening       Relevant Orders   VITAMIN D  25 Hydroxy (Vit-D Deficiency, Fractures)   Vitamin B12     Screening, lipid       Relevant Orders   Lipid panel     Screening for thyroid  disorder       Relevant Orders   TSH      Return in about 4 weeks (around 07/15/2023) for weight management.     Jenelle Mis, FNP

## 2023-06-17 NOTE — Assessment & Plan Note (Signed)
 No exacerbations. Well controlled by avoiding triggers and rare albuterol  use.

## 2023-06-17 NOTE — Assessment & Plan Note (Signed)
 Diet controlled. Elevated HOB if needed and avoid lying down 2-3 hours after eating, avoid coffee, alcohol, chocolate, fatty foods, citrus, carbonated beverages, spicy foods, late meals, and smoking. Return to office if symptoms return or worsen and seek medical care for difficulty swallowing, bleeding, anemia, weight loss, recurrent vomiting

## 2023-06-17 NOTE — Assessment & Plan Note (Signed)

## 2023-06-18 ENCOUNTER — Telehealth: Payer: Self-pay | Admitting: Pharmacy Technician

## 2023-06-18 ENCOUNTER — Ambulatory Visit: Payer: Self-pay | Admitting: Family Medicine

## 2023-06-18 ENCOUNTER — Other Ambulatory Visit (HOSPITAL_COMMUNITY): Payer: Self-pay

## 2023-06-18 LAB — TSH: TSH: 1.32 m[IU]/L

## 2023-06-18 LAB — CBC WITH DIFFERENTIAL/PLATELET
Absolute Lymphocytes: 2205 {cells}/uL (ref 850–3900)
Absolute Monocytes: 456 {cells}/uL (ref 200–950)
Basophils Absolute: 32 {cells}/uL (ref 0–200)
Basophils Relative: 0.6 %
Eosinophils Absolute: 154 {cells}/uL (ref 15–500)
Eosinophils Relative: 2.9 %
HCT: 42 % (ref 35.0–45.0)
Hemoglobin: 13.2 g/dL (ref 11.7–15.5)
MCH: 28 pg (ref 27.0–33.0)
MCHC: 31.4 g/dL — ABNORMAL LOW (ref 32.0–36.0)
MCV: 89 fL (ref 80.0–100.0)
MPV: 11.1 fL (ref 7.5–12.5)
Monocytes Relative: 8.6 %
Neutro Abs: 2454 {cells}/uL (ref 1500–7800)
Neutrophils Relative %: 46.3 %
Platelets: 268 10*3/uL (ref 140–400)
RBC: 4.72 10*6/uL (ref 3.80–5.10)
RDW: 13.8 % (ref 11.0–15.0)
Total Lymphocyte: 41.6 %
WBC: 5.3 10*3/uL (ref 3.8–10.8)

## 2023-06-18 LAB — COMPREHENSIVE METABOLIC PANEL WITH GFR
AG Ratio: 1.8 (calc) (ref 1.0–2.5)
ALT: 14 U/L (ref 6–29)
AST: 16 U/L (ref 10–30)
Albumin: 4.4 g/dL (ref 3.6–5.1)
Alkaline phosphatase (APISO): 46 U/L (ref 31–125)
BUN: 15 mg/dL (ref 7–25)
CO2: 27 mmol/L (ref 20–32)
Calcium: 9.4 mg/dL (ref 8.6–10.2)
Chloride: 104 mmol/L (ref 98–110)
Creat: 0.93 mg/dL (ref 0.50–0.97)
Globulin: 2.5 g/dL (ref 1.9–3.7)
Glucose, Bld: 82 mg/dL (ref 65–99)
Potassium: 4.5 mmol/L (ref 3.5–5.3)
Sodium: 139 mmol/L (ref 135–146)
Total Bilirubin: 0.7 mg/dL (ref 0.2–1.2)
Total Protein: 6.9 g/dL (ref 6.1–8.1)
eGFR: 81 mL/min/{1.73_m2} (ref >=60)

## 2023-06-18 LAB — LIPID PANEL
Cholesterol: 180 mg/dL (ref <200)
HDL: 64 mg/dL (ref >=50)
LDL Cholesterol (Calc): 98 mg/dL
Non-HDL Cholesterol (Calc): 116 mg/dL (ref <130)
Total CHOL/HDL Ratio: 2.8 (calc) (ref <5.0)
Triglycerides: 86 mg/dL (ref <150)

## 2023-06-18 LAB — VITAMIN D 25 HYDROXY (VIT D DEFICIENCY, FRACTURES): Vit D, 25-Hydroxy: 27 ng/mL — ABNORMAL LOW (ref 30–100)

## 2023-06-18 LAB — VITAMIN B12: Vitamin B-12: 972 pg/mL (ref 200–1100)

## 2023-06-18 LAB — HEMOGLOBIN A1C
Hgb A1c MFr Bld: 5.1 % (ref <5.7)
Mean Plasma Glucose: 100 mg/dL
eAG (mmol/L): 5.5 mmol/L

## 2023-06-18 NOTE — Telephone Encounter (Signed)
 Pharmacy Patient Advocate Encounter   Received notification from CoverMyMeds that prior authorization for Zepbound 2.5MG /0.5ML pen-injectors is required/requested.   Insurance verification completed.   The patient is insured through [facility] [place] .   Per test claim: PA required; PA submitted to above mentioned insurance via CoverMyMeds Key/confirmation #/EOC [id number] Status is pending

## 2023-06-18 NOTE — Telephone Encounter (Signed)
 Pharmacy Patient Advocate Encounter  Received notification from [facility] Medicaid that Prior Authorization for Zepbound 2.5MG /0.5ML pen-injectors has been APPROVED from 06/18/23 to 12/15/23. Ran test claim, Copay is $4.00. This test claim was processed through [facility]- copay amounts may vary at other pharmacies due to pharmacy/plan contracts, or as the patient moves through the different stages of their insurance plan.   PA #/Case ID/Reference #: [id number]

## 2023-06-19 ENCOUNTER — Other Ambulatory Visit (HOSPITAL_COMMUNITY): Payer: Self-pay

## 2023-06-19 MED ORDER — ZEPBOUND 2.5 MG/0.5ML ~~LOC~~ SOAJ
2.5000 mg | SUBCUTANEOUS | 0 refills | Status: DC
  Filled 2023-06-19: qty 2, 28d supply, fill #0

## 2023-06-19 MED ORDER — ALBUTEROL SULFATE HFA 108 (90 BASE) MCG/ACT IN AERS
2.0000 | INHALATION_SPRAY | Freq: Four times a day (QID) | RESPIRATORY_TRACT | 2 refills | Status: DC | PRN
  Filled 2023-06-19: qty 18, 25d supply, fill #0

## 2023-06-19 MED ORDER — LOSARTAN POTASSIUM 100 MG PO TABS
100.0000 mg | ORAL_TABLET | Freq: Every day | ORAL | 1 refills | Status: DC
  Filled 2023-06-19: qty 90, 90d supply, fill #0

## 2023-06-25 DIAGNOSIS — Z419 Encounter for procedure for purposes other than remedying health state, unspecified: Secondary | ICD-10-CM | POA: Diagnosis not present

## 2023-06-26 ENCOUNTER — Ambulatory Visit: Payer: Self-pay

## 2023-06-26 ENCOUNTER — Telehealth: Payer: Self-pay | Admitting: Family Medicine

## 2023-06-26 NOTE — Telephone Encounter (Signed)
 Copied from CRM [id number]. Topic: Clinical - Medication Question >> Jun 26, 2023 11:29 AM Donee H wrote: Reason for CRM: Patient calling to see if it is possible to see Yolanda Hence S, regarding having Nexplanon  removed. Patient states going to expire soon. She already has an appointment with Gyn on June 26 but wants to get it removed today June 13 or Monday June 16. Callback number   [phone]

## 2023-06-26 NOTE — Telephone Encounter (Signed)
 FYI Only or Action Required?: FYI only for provider  Patient was last seen in primary care on 06/17/2023 by Jenelle Mis, FNP. Called Nurse Triage reporting Shoulder Pain. Symptoms began a week ago. Interventions attempted: OTC medications: Tylenol  and Ice/heat application. Symptoms are: unchanged.  Triage Disposition: See PCP When Office is Open (Within 3 Days)  Patient/caregiver understands and will follow disposition?: Yes             Copied from CRM [id number]. Topic: Clinical - Red Word Triage >> Jun 26, 2023  3:25 PM Emylou G wrote: [facility] that prompted transfer to Nurse Triage: Shoulder is experiencing excrutiating pain.Aaron Aas its also going up into your neck Reason for Disposition  [1] MODERATE pain (e.g., interferes with normal activities) AND [2] present > 3 days  Answer Assessment - Initial Assessment Questions 1. ONSET: When did the pain start?     1 wk  2. LOCATION: Where is the pain located?     L shoulder - to the neck (painful turning her head to the L) 3. PAIN: How bad is the pain? (Scale 1-10; or mild, moderate, severe)   - MILD (1-3): doesn't interfere with normal activities   - MODERATE (4-7): interferes with normal activities (e.g., work or school) or awakens from sleep   - SEVERE (8-10): excruciating pain, unable to do any normal activities, unable to move arm at all due to pain     10/10; when she rests, she feels the pain, still able to use her arm  4. WORK OR EXERCISE: Has there been any recent work or exercise that involved this part of the body?     Denies - states she slept the wrong way  5. CAUSE: What do you think is causing the shoulder pain?     Slept on it wrong  6. OTHER SYMPTOMS: Do you have any other symptoms? (e.g., neck pain, swelling, rash, fever, numbness, weakness)     Tried ice and Tylenol , has no effect  Denies numbness and weakness; sometimes when I try to turn my neck I feel pain in my lower back  Denies fever, denies  rash  Protocols used: Shoulder Pain-A-AH

## 2023-06-29 ENCOUNTER — Other Ambulatory Visit: Payer: Self-pay | Admitting: Family Medicine

## 2023-06-29 ENCOUNTER — Encounter: Payer: Self-pay | Admitting: Family Medicine

## 2023-06-29 ENCOUNTER — Ambulatory Visit (INDEPENDENT_AMBULATORY_CARE_PROVIDER_SITE_OTHER): Admitting: Family Medicine

## 2023-06-29 VITALS — BP 124/78 | HR 74 | Temp 99.3°F | Ht 69.0 in | Wt 245.1 lb

## 2023-06-29 DIAGNOSIS — M542 Cervicalgia: Secondary | ICD-10-CM

## 2023-06-29 DIAGNOSIS — N912 Amenorrhea, unspecified: Secondary | ICD-10-CM

## 2023-06-29 DIAGNOSIS — M62838 Other muscle spasm: Secondary | ICD-10-CM | POA: Diagnosis not present

## 2023-06-29 DIAGNOSIS — M25512 Pain in left shoulder: Secondary | ICD-10-CM

## 2023-06-29 LAB — PREGNANCY, URINE: Preg Test, Ur: NEGATIVE

## 2023-06-29 MED ORDER — PREDNISONE 10 MG (21) PO TBPK: ORAL_TABLET | ORAL | 0 refills | Status: DC

## 2023-06-29 MED ORDER — GABAPENTIN 100 MG PO CAPS: 100.0000 mg | ORAL_CAPSULE | Freq: Three times a day (TID) | ORAL | 0 refills | Status: DC

## 2023-06-29 MED ORDER — METHOCARBAMOL 750 MG PO TABS: 750.0000 mg | ORAL_TABLET | Freq: Three times a day (TID) | ORAL | 1 refills | Status: DC | PRN

## 2023-06-29 NOTE — Progress Notes (Signed)
 Patient Office Visit  Assessment & Plan:  Acute pain of left shoulder -     DG Shoulder Left; Future -     DG Cervical Spine Complete; Future -     predniSONE ; Use as directed.  Dispense: 21 each; Refill: 0 -     Gabapentin ; Take 1 capsule (100 mg total) by mouth 3 (three) times daily. Start at night time and increase up to 3 at night time (for pain)  Dispense: 90 capsule; Refill: 0  Neck pain on left side -     DG Cervical Spine Complete; Future -     predniSONE ; Use as directed.  Dispense: 21 each; Refill: 0 -     Gabapentin ; Take 1 capsule (100 mg total) by mouth 3 (three) times daily. Start at night time and increase up to 3 at night time (for pain)  Dispense: 90 capsule; Refill: 0  Amenorrhea -     Pregnancy, urine  Muscle spasms of neck  Other orders -     Methocarbamol ; Take 1 tablet (750 mg total) by mouth every 8 (eight) hours as needed for muscle spasms.  Dispense: 30 tablet; Refill: 1   Assessment and Plan    Left Shoulder and Neck Pain Acute pain with radiation and numbness, likely muscle strain or spasm involving trapezius. NSAIDs contraindicated due to allergy. Prednisone  considered for inflammation. - Order X-ray of left shoulder and neck/cervical spine at [facility]. - Prescribe Robaxin  for muscle tightness. - Prescribe prednisone  taper with food to avoid GI upset. - Discuss prednisone  side effects, including sleep interference. - Offer gabapentin  for sleep if needed, starting with low dose and gradually increase  Potential Pregnancy Uncertain pregnancy status due to expired Nexplanon  and amenorrhea. Pregnancy test required before X-rays. urine pregnancy negative today- patient aware - Order pregnancy test before X-rays.  Ibuprofen Allergy Severe allergic reaction to ibuprofen limits NSAID use. - Avoid NSAIDs such as ibuprofen and Celebrex.  Follow-up Scheduled OB GYN appointment for expired Nexplanon  and potential pregnancy. - Ensure follow-up with OB  GYN on July 09, 2023, for contraceptive evaluation and management.          Return if symptoms worsen or fail to improve.   Subjective:    Patient ID: Janet [name], female    DOB: 15-Apr-1985  Age: 38 y.o. MRN: [id number]  Chief Complaint  Patient presents with   Shoulder Pain    Left shoulder pain radiating to neck x 1.5 weeks.     Shoulder Pain    Discussed the use of AI scribe software for clinical note transcription with the patient, who gave verbal consent to proceed.  History of Present Illness   A 38 year old female presents with severe left shoulder and neck pain for the past 1.5 weeks.   She experiences severe pain in her left shoulder and neck, rated as 9 out of 10 in intensity, which began after waking up, possibly due to sleeping in an awkward position. There is no history of recent trauma or heavy lifting. She has attempted self-treatment with Tylenol  and ice for a couple of days without relief. Previously used Robaxin , a muscle relaxant, which is now finished. She has difficulty and discomfort when turning her neck to the left. The pain sometimes radiates towards her upper arm and is associated with numbness and tingling around the neck area.  Her sleep is affected as she can only sleep on her back to avoid discomfort. 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 She experiences pain when driving, especially when needing to turn her head to the left.  She has a history of stomach issues but denies any history of GI bleeding or ulcers. She is allergic to ibuprofen and other NSAIDs, which causes swelling in her mouth and tongue. She has not tried any anti-inflammatory medications like meloxicam or Celebrex due to her allergy. patient can take prednisone .   There is uncertainty about her pregnancy status as her Nexplanon  expired in May and she has not had a menstrual period.  She has an upcoming appointment with her OB GYN later this month to address this.     A 38 year old female presents with severe left shoulder and neck pain for the past 1.5 weeks.   She experiences severe pain in her left shoulder and neck, rated as 9 out of 10 in intensity, which began after waking up, possibly due to sleeping in an awkward position. There is no history of recent trauma or heavy lifting. She has attempted self-treatment with Tylenol  and ice for a couple of days without relief. Previously used Robaxin , a muscle relaxant, which is now finished. She has difficulty and discomfort when turning her neck to the left. The pain sometimes radiates towards her upper arm and is associated with numbness and tingling around the neck area.  Her sleep is affected as she can only sleep on her back to avoid discomfort. 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 She experiences pain when driving, especially when needing to turn her head to the left.  She has a history of stomach issues but denies any history of GI bleeding or ulcers. She is allergic to ibuprofen and other NSAIDs, which causes swelling in her mouth and tongue. She has not tried any anti-inflammatory medications like meloxicam or Celebrex due to her allergy. patient can take prednisone .   There is uncertainty about her pregnancy status as her Nexplanon  expired in May and she has not had a menstrual period. She has an upcoming appointment with her OB GYN later this month to address this. Physical Exam MUSCULOSKELETAL: Limited range of motion in the left shoulder and neck. Tenderness over the left shoulder joint. Tightness in neck muscles. 5/5 strength in left shoulder. Results Assessment & Plan Left Shoulder and Neck Pain Acute pain with radiation and numbness, likely muscle strain or spasm involving trapezius. NSAIDs contraindicated due  to allergy. Prednisone  considered for inflammation. - Order X-ray of left shoulder and neck/cervical spine at [facility]. - Prescribe Robaxin  for muscle tightness. - Prescribe prednisone  taper with food to avoid GI upset. - Discuss prednisone  side effects, including sleep interference. - Offer gabapentin  for sleep if needed, starting with low dose and gradually increase  Potential Pregnancy Uncertain pregnancy status due to expired Nexplanon  and amenorrhea. Pregnancy test required before X-rays. urine pregnancy negative today- patient aware - Order pregnancy test before X-rays.  Ibuprofen Allergy Severe allergic reaction to ibuprofen limits NSAID use. - Avoid NSAIDs such as ibuprofen and Celebrex.  Follow-up Scheduled OB GYN appointment for expired Nexplanon  and potential pregnancy. - Ensure follow-up with OB GYN on July 09, 2023, for contraceptive evaluation and management.  The ASCVD Risk score (Arnett DK, et al., 2019) failed to calculate for the following reasons:   The 2019 ASCVD risk score is only valid for  ages 30 to 81  Past Medical History:  Diagnosis Date   Abdominal pain 05/06/2021   Anemia    Asthma    Bilateral knee pain 10/22/2020   Depression    Finger pain, left 11/26/2017   Gestational diabetes mellitus, antepartum 08/12/2016   Moved pt to [facility] GDM program Growth US  at 38 week EFW 7#13oz, 82nd%   Left wrist pain 06/18/2020   Migraines    Right wrist pain 11/26/2017   Skin tag 05/11/2020   Past Surgical History:  Procedure Laterality Date   CESAREAN SECTION N/A 10/17/2016   Procedure: REPEAT CESAREAN SECTION;  Surgeon: Tresia Fruit, MD;  Location: [facility] 2 BIRTHING SUITES;  Service: Obstetrics;  Laterality: N/A;   CESAREAN SECTION     DILATION AND CURETTAGE OF UTERUS     Social History   Tobacco Use   Smoking status: Never   Smokeless tobacco: Never  Vaping Use   Vaping status: Never Used  Substance Use Topics   Alcohol use: Not Currently    Comment:  once every few months   Drug use: No   Family History  Problem Relation Age of Onset   Hypertension Mother    Heart disease Mother    Diabetes Father    Breast cancer Maternal Aunt 70   Sleep apnea Neg Hx    Allergies  Allergen Reactions   Ibuprofen Swelling    Facial swelling   Nsaids Other (See Comments)    Stomach ulcers   Ondansetron  Hcl Other (See Comments)    Pt states it made me crazy  Pt states it made me crazy    Pt states it made me crazy Pt states it made me crazy   Other Other (See Comments)    Other reaction(s): Other (See Comments)  Stomach ulcers  Other reaction(s): Other (See Comments) Stomach ulcers    Other reaction(s): Other (See Comments) Stomach ulcers   Levofloxacin In D5w Rash and Itching   Metronidazole  Itching   Tramadol  Itching    ROS    Objective:    BP 124/78   Pulse 74   Temp 99.3 F (37.4 C)   Ht 5' 9 (1.753 m)   Wt 245 lb 2 oz (111.2 kg)   SpO2 98%   BMI 36.20 kg/m  BP Readings from Last 3 Encounters:  06/29/23 124/78  06/17/23 118/82  04/22/23 124/86   Wt Readings from Last 3 Encounters:  06/29/23 245 lb 2 oz (111.2 kg)  06/17/23 247 lb 3.2 oz (112.1 kg)  04/22/23 240 lb (108.9 kg)    Physical Exam Vitals and nursing note reviewed.  Constitutional:      Appearance: Normal appearance.  HENT:     Head: Normocephalic.     Right Ear: Tympanic membrane, ear canal and external ear normal.     Left Ear: Tympanic membrane, ear canal and external ear normal.   Eyes:     Extraocular Movements: Extraocular movements intact.     Conjunctiva/sclera: Conjunctivae normal.     Pupils: Pupils are equal, round, and reactive to light.    Cardiovascular:     Rate and Rhythm: Normal rate and regular rhythm.     Heart sounds: Normal heart sounds.  Pulmonary:     Effort: Pulmonary effort is normal.     Breath sounds: Normal breath sounds.   Musculoskeletal:     Left shoulder: Tenderness and bony tenderness present.  Decreased range of motion.     Cervical back: Bony tenderness present. Pain  with movement present.     Right lower leg: No edema.     Left lower leg: No edema.     Comments: Patient has palpable muscle spasm over left trapezius    Neurological:     General: No focal deficit present.     Mental Status: She is alert and oriented to person, place, and time.   Psychiatric:        Mood and Affect: Mood normal.        Behavior: Behavior normal.        Thought Content: Thought content normal.        Judgment: Judgment normal.      Results for orders placed or performed in visit on 06/29/23  Pregnancy, urine  Result Value Ref Range   Preg Test, Ur NEGATIVE NEGATIVE

## 2023-07-09 ENCOUNTER — Ambulatory Visit: Admitting: Obstetrics and Gynecology

## 2023-07-20 ENCOUNTER — Ambulatory Visit (INDEPENDENT_AMBULATORY_CARE_PROVIDER_SITE_OTHER): Admitting: Family Medicine

## 2023-07-20 ENCOUNTER — Other Ambulatory Visit (HOSPITAL_COMMUNITY): Payer: Self-pay

## 2023-07-20 ENCOUNTER — Encounter: Payer: Self-pay | Admitting: Family Medicine

## 2023-07-20 DIAGNOSIS — Z6836 Body mass index (BMI) 36.0-36.9, adult: Secondary | ICD-10-CM | POA: Diagnosis not present

## 2023-07-20 DIAGNOSIS — I10 Essential (primary) hypertension: Secondary | ICD-10-CM | POA: Diagnosis not present

## 2023-07-20 MED ORDER — ZEPBOUND 5 MG/0.5ML ~~LOC~~ SOAJ
5.0000 mg | SUBCUTANEOUS | 0 refills | Status: DC
  Filled 2023-07-20: qty 2, 28d supply, fill #0

## 2023-07-20 NOTE — Assessment & Plan Note (Addendum)
 Chronic well controlled on Losartan  100mg  daily. Encourage heart healthy diet and 150 minutes moderate intensity exercise weekly. Seek medical care for chest pain, shortness of breath, vision changes, palpitations, worsening headaches, swelling of extremities. Follow up in 1 month.

## 2023-07-20 NOTE — Progress Notes (Signed)
 Subjective:  HPI: Janet [name] is a 38 y.o. female presenting on 07/20/2023 for Medical Management of Chronic Issues (Wt. Management )   HPI Patient is in today for weight management. She is taking Zepbound  2.5mg  weekly for 4 weeks. Denies abdominal pain, N/V/D, is having some mild constipation relieved with senna tea and probiotics.   WEIGHT MANAGEMENT Starting BMI: 36.49 Current BMI: 36.8 Goal weight: 175 Diet: low calorie, high protein, keto Exercise: physically strenuous job Medication: zepbound  2.5mg  weekly Side Effects: constipation   Review of Systems  All other systems reviewed and are negative.   Relevant past medical history reviewed and updated as indicated.   Past Medical History:  Diagnosis Date   Abdominal pain 05/06/2021   Anemia    Asthma    Bilateral knee pain 10/22/2020   Depression    Finger pain, left 11/26/2017   Gestational diabetes mellitus, antepartum 08/12/2016   Moved pt to [facility] GDM program Growth US  at 38 week EFW 7#13oz, 82nd%   Left wrist pain 06/18/2020   Migraines    Right wrist pain 11/26/2017   Skin tag 05/11/2020     Past Surgical History:  Procedure Laterality Date   CESAREAN SECTION N/A 10/17/2016   Procedure: REPEAT CESAREAN SECTION;  Surgeon: Eveline Lynwood MATSU, MD;  Location: [facility] BIRTHING SUITES;  Service: Obstetrics;  Laterality: N/A;   CESAREAN SECTION     DILATION AND CURETTAGE OF UTERUS      Allergies and medications reviewed and updated.   Current Outpatient Medications:    albuterol  (VENTOLIN  HFA) 108 (90 Base) MCG/ACT inhaler, Inhale 2 puffs into the lungs every 6 (six) hours as needed for wheezing or shortness of breath., Disp: 8 g, Rfl: 2   albuterol  (VENTOLIN  HFA) 108 (90 Base) MCG/ACT inhaler, Inhale 2 puffs into the lungs every 6 (six) hours as needed for wheezing or shortness of breath., Disp: 18 g, Rfl: 2   diclofenac  Sodium (VOLTAREN  ARTHRITIS PAIN) 1 % GEL, Apply 2 g topically daily., Disp: 100 g, Rfl: 1    Etonogestrel  (NEXPLANON  [place]), Inject into the skin., Disp: , Rfl:    gabapentin  (NEURONTIN ) 100 MG capsule, TAKE 1 CAPSULE BY MOUTH 3 TIMES DAILY. START AT NIGHTTIME AND INCREASE UP TO 3 AT NIGHTTIME FOR PAIN, Disp: 90 capsule, Rfl: 0   losartan  (COZAAR ) 100 MG tablet, Take 1 tablet (100 mg total) by mouth daily., Disp: 90 tablet, Rfl: 1   losartan  (COZAAR ) 100 MG tablet, Take 1 tablet (100 mg total) by mouth at bedtime., Disp: 90 tablet, Rfl: 1   methocarbamol  (ROBAXIN ) 750 MG tablet, Take 1 tablet (750 mg total) by mouth every 8 (eight) hours as needed for muscle spasms., Disp: 30 tablet, Rfl: 1   polyethylene glycol powder (GLYCOLAX /MIRALAX ) 17 GM/SCOOP powder, Take 17 g by mouth daily as needed for mild constipation or moderate constipation., Disp: 3350 g, Rfl: 1   predniSONE  (STERAPRED UNI-PAK 21 TAB) 10 MG (21) TBPK tablet, Use as directed., Disp: 21 each, Rfl: 0   progesterone  (PROMETRIUM ) 100 MG capsule, Take 1 capsule (100 mg total) by mouth at bedtime., Disp: 30 capsule, Rfl: 1   tirzepatide  (ZEPBOUND ) 5 MG/0.5ML Pen, Inject 5 mg into the skin once a week., Disp: 2 mL, Rfl: 0   triamcinolone  (KENALOG ) 0.025 % ointment, Apply 1 Application topically 2 (two) times daily., Disp: 30 g, Rfl: 0  Allergies  Allergen Reactions   Ibuprofen Swelling    Facial swelling   Nsaids Other (See Comments)  Stomach ulcers   Ondansetron  Hcl Other (See Comments)    Pt states it made me crazy  Pt states it made me crazy    Pt states it made me crazy Pt states it made me crazy   Other Other (See Comments)    Other reaction(s): Other (See Comments)  Stomach ulcers  Other reaction(s): Other (See Comments) Stomach ulcers    Other reaction(s): Other (See Comments) Stomach ulcers   Levofloxacin In D5w Rash and Itching   Metronidazole  Itching   Tramadol  Itching    Objective:   BP 124/86   Pulse 73   Temp 98.4 F (36.9 C)   Ht 5' 9 (1.753 m)   Wt 249 lb 3.2 oz (113 kg) Comment: 47  waist circumferance  SpO2 98%   BMI 36.80 kg/m      07/20/2023    2:48 PM 06/29/2023   10:41 AM 06/17/2023    2:42 PM  Vitals with BMI  Height 5' 9 5' 9 5' 9  Weight 249 lbs 3 oz 245 lbs 2 oz 247 lbs 3 oz  BMI 36.78 36.18 36.49  Systolic 124 124 881  Diastolic 86 78 82  Pulse 73 74 73     Physical Exam Vitals and nursing note reviewed.  Constitutional:      Appearance: Normal appearance. She is obese.  HENT:     Head: Normocephalic and atraumatic.  Skin:    General: Skin is warm and dry.  Neurological:     General: No focal deficit present.     Mental Status: She is alert and oriented to person, place, and time. Mental status is at baseline.  Psychiatric:        Mood and Affect: Mood normal.        Behavior: Behavior normal.        Thought Content: Thought content normal.        Judgment: Judgment normal.     Assessment & Plan:  Morbid obesity (HCC) Assessment & Plan: BMI 36.8 on Zepbound  2.5mg  weekly. Tolerating well. Will increase to 5mg  weekly. Follow up in 4 weeks. Continue diet and encouraged intensified exercise routine.    Essential hypertension, benign Assessment & Plan: Chronic well controlled on Losartan  100mg  daily. Encourage heart healthy diet and 150 minutes moderate intensity exercise weekly. Seek medical care for chest pain, shortness of breath, vision changes, palpitations, worsening headaches, swelling of extremities. Follow up in 1 month.   Other orders -     Zepbound ; Inject 5 mg into the skin once a week.  Dispense: 2 mL; Refill: 0     Follow up plan: Return in about 4 weeks (around 08/17/2023) for weight management.  Jeoffrey GORMAN Barrio, FNP

## 2023-07-20 NOTE — Assessment & Plan Note (Signed)
 BMI 36.8 on Zepbound  2.5mg  weekly. Tolerating well. Will increase to 5mg  weekly. Follow up in 4 weeks. Continue diet and encouraged intensified exercise routine.

## 2023-07-21 ENCOUNTER — Ambulatory Visit: Admitting: "Endocrinology

## 2023-07-25 DIAGNOSIS — Z419 Encounter for procedure for purposes other than remedying health state, unspecified: Secondary | ICD-10-CM | POA: Diagnosis not present

## 2023-08-05 ENCOUNTER — Ambulatory Visit: Admitting: Obstetrics and Gynecology

## 2023-08-05 VITALS — BP 145/97 | HR 78 | Wt 250.0 lb

## 2023-08-05 DIAGNOSIS — Z3040 Encounter for surveillance of contraceptives, unspecified: Secondary | ICD-10-CM

## 2023-08-05 DIAGNOSIS — Z3046 Encounter for surveillance of implantable subdermal contraceptive: Secondary | ICD-10-CM | POA: Diagnosis not present

## 2023-08-05 NOTE — Progress Notes (Signed)
 RGYN here for Nexplanon  removal. Inserted 05/17/20

## 2023-08-05 NOTE — Progress Notes (Signed)
 Patient ID: Janet [name], female   DOB: 12/09/1985, 38 y.o.   MRN: [id number]  Janet [name] is a [id number] female in the office today for removal of Nexplanon ; which was inserted on 05/17/2020 at [facility]. She does not desire to have it replaced today. She has no complaints today.  BP (!) 145/97   Pulse 78   Wt 250 lb (113.4 kg)   BMI 36.92 kg/m    Procedure Note: Consent obtained and Time-Out conducted Implant palpated in left upper arm Betadine prep done on area of excision/removal Lidocaine  infiltrated into intradermal and subcutaneous space Small 2mm incision made with scalpel Pressure applied to distal end of implant which exposed the tip through incision Tip of implant grasped with hemostat There was some adherence of implant in subcutaneous tissue.  Twisting and manipulation freed the implant from the capsule Implant removed Pressure held on incision until bleeding stopped Steristrips applied to incision Pressure dressing applied by RN Patient tolerated procedure well.   Assessment and Plan: 1. Encounter for Nexplanon  removal  2. Encounter for surveillance of contraceptive device    Total face-to-face time spent during this encounter was 10 minutes. There was 5 minutes of chart review time spent prior to this encounter. Total time spent = 15 minutes.   Janet [name], CNM  08/05/2023 1:53 PM

## 2023-08-09 ENCOUNTER — Other Ambulatory Visit: Payer: Self-pay | Admitting: Family Medicine

## 2023-08-09 DIAGNOSIS — M25512 Pain in left shoulder: Secondary | ICD-10-CM

## 2023-08-09 DIAGNOSIS — M542 Cervicalgia: Secondary | ICD-10-CM

## 2023-08-20 ENCOUNTER — Other Ambulatory Visit (HOSPITAL_COMMUNITY): Payer: Self-pay

## 2023-08-20 ENCOUNTER — Ambulatory Visit: Admitting: Family Medicine

## 2023-08-20 ENCOUNTER — Encounter: Payer: Self-pay | Admitting: Family Medicine

## 2023-08-20 MED ORDER — ZEPBOUND 7.5 MG/0.5ML ~~LOC~~ SOAJ
7.5000 mg | SUBCUTANEOUS | 2 refills | Status: DC
  Filled 2023-08-20: qty 2, 28d supply, fill #0
  Filled 2023-09-25: qty 2, 28d supply, fill #1
  Filled 2023-10-19 (×2): qty 2, 28d supply, fill #2

## 2023-08-20 NOTE — Assessment & Plan Note (Signed)
 BMI 36.03 on Zepbound  5mg  weekly. Tolerating well. Will increase to 7.5mg  weekly. Follow up in 3 months or sooner if needed. Continue diet and encouraged intensified exercise routine.

## 2023-08-20 NOTE — Progress Notes (Signed)
 Subjective:  HPI: Janet [name] is a 38 y.o. female presenting on 08/20/2023 for Medical Management of Chronic Issues (4 wk f/u )   HPI Patient is in today for weight management. She is taking Zepbound  5mg  weekly for 4 weeks. Denies abdominal pain, N/V/D, is having some mild constipation relieved with senna tea and probiotics.    WEIGHT MANAGEMENT Starting BMI: 36.49 Current BMI: 36.03 Goal weight: 175 Abd: 45in Diet: low calorie, high protein, keto Exercise: physically strenuous job, is doing PT three times weekly and exercising at home Medication: zepbound  5mg  weekly Side Effects: constipation  Review of Systems  All other systems reviewed and are negative.   Relevant past medical history reviewed and updated as indicated.   Past Medical History:  Diagnosis Date   Abdominal pain 05/06/2021   Anemia    Asthma    Bilateral knee pain 10/22/2020   Depression    Finger pain, left 11/26/2017   Gestational diabetes mellitus, antepartum 08/12/2016   Moved pt to [facility] GDM program Growth US  at 38 week EFW 7#13oz, 82nd%   Left wrist pain 06/18/2020   Migraines    Right wrist pain 11/26/2017   Skin tag 05/11/2020     Past Surgical History:  Procedure Laterality Date   CESAREAN SECTION N/A 10/17/2016   Procedure: REPEAT CESAREAN SECTION;  Surgeon: Eveline Lynwood MATSU, MD;  Location: [facility] BIRTHING SUITES;  Service: Obstetrics;  Laterality: N/A;   CESAREAN SECTION     DILATION AND CURETTAGE OF UTERUS      Allergies and medications reviewed and updated.   Current Outpatient Medications:    albuterol  (VENTOLIN  HFA) 108 (90 Base) MCG/ACT inhaler, Inhale 2 puffs into the lungs every 6 (six) hours as needed for wheezing or shortness of breath., Disp: 8 g, Rfl: 2   losartan  (COZAAR ) 100 MG tablet, Take 1 tablet (100 mg total) by mouth daily., Disp: 90 tablet, Rfl: 1   tirzepatide  (ZEPBOUND ) 7.5 MG/0.5ML Pen, Inject 7.5 mg into the skin once a week., Disp: 2 mL, Rfl: 2   albuterol   (VENTOLIN  HFA) 108 (90 Base) MCG/ACT inhaler, Inhale 2 puffs into the lungs every 6 (six) hours as needed for wheezing or shortness of breath. (Patient not taking: Reported on 08/20/2023), Disp: 18 g, Rfl: 2   diclofenac  Sodium (VOLTAREN  ARTHRITIS PAIN) 1 % GEL, Apply 2 g topically daily. (Patient not taking: Reported on 08/20/2023), Disp: 100 g, Rfl: 1   Etonogestrel  (NEXPLANON  [place]), Inject into the skin. (Patient not taking: Reported on 08/20/2023), Disp: , Rfl:    gabapentin  (NEURONTIN ) 100 MG capsule, START WITH 1 AT NIGHTTIME AND INCREASE UP TO 3 AT NIGHTTIME FOR PAIN (Patient not taking: Reported on 08/20/2023), Disp: 90 capsule, Rfl: 0   losartan  (COZAAR ) 100 MG tablet, Take 1 tablet (100 mg total) by mouth at bedtime. (Patient not taking: Reported on 08/20/2023), Disp: 90 tablet, Rfl: 1   methocarbamol  (ROBAXIN ) 750 MG tablet, Take 1 tablet (750 mg total) by mouth every 8 (eight) hours as needed for muscle spasms., Disp: 30 tablet, Rfl: 1   polyethylene glycol powder (GLYCOLAX /MIRALAX ) 17 GM/SCOOP powder, Take 17 g by mouth daily as needed for mild constipation or moderate constipation. (Patient not taking: Reported on 08/20/2023), Disp: 3350 g, Rfl: 1   predniSONE  (STERAPRED UNI-PAK 21 TAB) 10 MG (21) TBPK tablet, Use as directed. (Patient not taking: Reported on 08/20/2023), Disp: 21 each, Rfl: 0   progesterone  (PROMETRIUM ) 100 MG capsule, Take 1 capsule (100 mg total) by mouth at  bedtime. (Patient not taking: Reported on 08/20/2023), Disp: 30 capsule, Rfl: 1   triamcinolone  (KENALOG ) 0.025 % ointment, Apply 1 Application topically 2 (two) times daily. (Patient not taking: Reported on 08/20/2023), Disp: 30 g, Rfl: 0  Allergies  Allergen Reactions   Ibuprofen Swelling    Facial swelling   Nsaids Other (See Comments)    Stomach ulcers   Ondansetron  Hcl Other (See Comments)    Pt states it made me crazy  Pt states it made me crazy    Pt states it made me crazy Pt states it made me crazy   Other  Other (See Comments)    Other reaction(s): Other (See Comments)  Stomach ulcers  Other reaction(s): Other (See Comments) Stomach ulcers    Other reaction(s): Other (See Comments) Stomach ulcers   Levofloxacin In D5w Rash and Itching   Metronidazole  Itching   Tramadol  Itching    Objective:   BP 121/78   Pulse 71   Temp 98.3 F (36.8 C)   Ht 5' 9 (1.753 m)   Wt 244 lb (110.7 kg)   LMP 08/03/2023 (Approximate)   SpO2 99%   BMI 36.03 kg/m      08/20/2023    2:46 PM 08/05/2023    1:33 PM 07/20/2023    2:48 PM  Vitals with BMI  Height 5' 9  5' 9  Weight 244 lbs 250 lbs 249 lbs 3 oz  BMI 36.02 36.9 36.78  Systolic 121 145 875  Diastolic 78 97 86  Pulse 71 78 73     Physical Exam Vitals and nursing note reviewed.  Constitutional:      Appearance: Normal appearance. She is obese.  HENT:     Head: Normocephalic and atraumatic.  Skin:    General: Skin is warm and dry.  Neurological:     General: No focal deficit present.     Mental Status: She is alert and oriented to person, place, and time. Mental status is at baseline.  Psychiatric:        Mood and Affect: Mood normal.        Behavior: Behavior normal.        Thought Content: Thought content normal.        Judgment: Judgment normal.     Assessment & Plan:  Morbid obesity (HCC) Assessment & Plan: BMI 36.03 on Zepbound  5mg  weekly. Tolerating well. Will increase to 7.5mg  weekly. Follow up in 3 months or sooner if needed. Continue diet and encouraged intensified exercise routine.    Other orders -     Zepbound ; Inject 7.5 mg into the skin once a week.  Dispense: 2 mL; Refill: 2     Follow up plan: Return in about 3 months (around 11/20/2023) for weight management.  Jeoffrey GORMAN Barrio, FNP

## 2023-08-25 DIAGNOSIS — Z419 Encounter for procedure for purposes other than remedying health state, unspecified: Secondary | ICD-10-CM | POA: Diagnosis not present

## 2023-08-27 ENCOUNTER — Other Ambulatory Visit (HOSPITAL_COMMUNITY): Payer: Self-pay

## 2023-09-25 ENCOUNTER — Other Ambulatory Visit (HOSPITAL_COMMUNITY): Payer: Self-pay

## 2023-09-25 DIAGNOSIS — Z419 Encounter for procedure for purposes other than remedying health state, unspecified: Secondary | ICD-10-CM | POA: Diagnosis not present

## 2023-09-30 ENCOUNTER — Ambulatory Visit: Admitting: Obstetrics and Gynecology

## 2023-09-30 VITALS — BP 138/84 | HR 80 | Wt 238.0 lb

## 2023-09-30 DIAGNOSIS — Z3009 Encounter for other general counseling and advice on contraception: Secondary | ICD-10-CM

## 2023-09-30 LAB — POCT URINE PREGNANCY: Preg Test, Ur: NEGATIVE

## 2023-09-30 NOTE — Progress Notes (Signed)
 Pt states LMP last week, intercourse 2 days ago.

## 2023-09-30 NOTE — Progress Notes (Signed)
 Obstetrics and Gynecology Visit Return Patient Evaluation  Appointment Date: 09/30/2023  Primary Care Provider: Kayla Jeoffrey RAMAN  OBGYN Clinic: [facility] for [facility]  Chief Complaint: nexplanon  placement  History of Present Illness:  Janet [name] is a 38 y.o. who desires another nexplanon . Patient had it removed because it was expired but didn't desire another one at the time. First day of LMP last week and sexual intercourse 2 days ago. Patient desires another nexplanon  today.   Review of Systems: as noted in the History of Present Illness.  Patient Active Problem List   Diagnosis Date Noted   Morbid obesity (HCC) 07/20/2023   Physical exam, annual 06/17/2023   Uncomplicated asthma 06/17/2023   Pain of right hip 04/22/2023   Skin tag 03/10/2023   Gastroesophageal reflux disease without esophagitis 01/29/2023   Bacterial vaginosis 01/29/2023   Breast tenderness 01/29/2023   Eczema 11/06/2022   Visit for TB skin test 11/06/2022   Essential hypertension 11/06/2022   Vaginal odor 11/06/2022   Left foot pain 08/11/2022   Fatigue 04/09/2022   Encounter to establish care with new doctor 04/09/2022   Nexplanon  in place since 05/17/2020 12/24/2021   Vaginal discharge 11/19/2021   Suicidal ideation 11/19/2021   Mood changes 11/06/2021   Mastalgia 10/25/2021   Soft tissue mass 10/25/2021   Shigella gastroenteritis 08/15/2021   PID (acute pelvic inflammatory disease) 08/14/2021   Headache    Bilateral hearing loss 07/08/2021   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   Essential hypertension, benign 04/24/2020   Depression, recurrent (HCC) 12/20/2019   Vitamin D  deficiency 12/20/2019   Insomnia 12/20/2019   Tension headache 06/15/2019   Current moderate episode of major depressive disorder without prior episode (HCC) 04/06/2019   Psychophysiological insomnia 04/06/2019   Chronic bilateral low back pain without sciatica 11/26/2017   Contracture of finger joint, right  03/17/2017   Benign paroxysmal positional vertigo 02/14/2017   Obesity, Class II, BMI 35-39.9 06/27/2016   Sickle cell trait (HCC) 05/15/2016   Class 2 severe obesity due to excess calories with serious comorbidity and body mass index (BMI) of 37.0 to 37.9 in adult [facility]) 04/24/2016   Medications:  Minsa [name] had no medications administered during this visit. Current Outpatient Medications  Medication Sig Dispense Refill   albuterol  (VENTOLIN  HFA) 108 (90 Base) MCG/ACT inhaler Inhale 2 puffs into the lungs every 6 (six) hours as needed for wheezing or shortness of breath. 8 g 2   losartan  (COZAAR ) 100 MG tablet Take 1 tablet (100 mg total) by mouth daily. 90 tablet 1   tirzepatide  (ZEPBOUND ) 7.5 MG/0.5ML Pen Inject 7.5 mg into the skin once a week. 2 mL 2   albuterol  (VENTOLIN  HFA) 108 (90 Base) MCG/ACT inhaler Inhale 2 puffs into the lungs every 6 (six) hours as needed for wheezing or shortness of breath. (Patient not taking: Reported on 08/20/2023) 18 g 2   diclofenac  Sodium (VOLTAREN  ARTHRITIS PAIN) 1 % GEL Apply 2 g topically daily. (Patient not taking: Reported on 08/20/2023) 100 g 1   gabapentin  (NEURONTIN ) 100 MG capsule START WITH 1 AT NIGHTTIME AND INCREASE UP TO 3 AT NIGHTTIME FOR PAIN (Patient not taking: Reported on 08/20/2023) 90 capsule 0   losartan  (COZAAR ) 100 MG tablet Take 1 tablet (100 mg total) by mouth at bedtime. (Patient not taking: Reported on 08/20/2023) 90 tablet 1   methocarbamol  (ROBAXIN ) 750 MG tablet Take 1 tablet (750 mg total) by mouth every 8 (eight) hours as needed for muscle spasms. 30 tablet 1  polyethylene glycol powder (GLYCOLAX /MIRALAX ) 17 GM/SCOOP powder Take 17 g by mouth daily as needed for mild constipation or moderate constipation. (Patient not taking: Reported on 08/20/2023) 3350 g 1   predniSONE  (STERAPRED UNI-PAK 21 TAB) 10 MG (21) TBPK tablet Use as directed. (Patient not taking: Reported on 08/20/2023) 21 each 0   progesterone  (PROMETRIUM ) 100 MG capsule  Take 1 capsule (100 mg total) by mouth at bedtime. (Patient not taking: Reported on 08/20/2023) 30 capsule 1   triamcinolone  (KENALOG ) 0.025 % ointment Apply 1 Application topically 2 (two) times daily. (Patient not taking: Reported on 08/20/2023) 30 g 0   No current facility-administered medications for this visit.    Allergies: is allergic to ibuprofen, nsaids, ondansetron  hcl, other, levofloxacin in d5w, metronidazole , and tramadol .  Physical Exam:  BP 138/84   Pulse 80   Wt 238 lb (108 kg)   LMP 09/23/2023 (Approximate)   BMI 35.15 kg/m  Body mass index is 35.15 kg/m. General appearance: Well nourished, well developed female in no acute distress.  Neuro/Psych:  Normal mood and affect.    Labs: UPT neg  Assessment: patient   Plan:  1. Encounter for other general counseling or advice on contraception (Primary) D/w her that I recommend abstinence and repeat UPT and if negative, place nexplanon  or wait until next period.    Future Appointments  Date Time Provider Department Center  11/23/2023  4:15 PM Kayla Jeoffrey RAMAN, FNP [facility]    Bebe Izell Raddle MD Attending Center for [facility])

## 2023-10-12 ENCOUNTER — Ambulatory Visit: Payer: Self-pay

## 2023-10-12 NOTE — Telephone Encounter (Signed)
 FYI Only or Action Required?: FYI only for provider.  Patient was last seen in primary care on 08/20/2023 by Kayla Jeoffrey RAMAN, FNP.  Called Nurse Triage reporting Dysuria.  Symptoms began several days ago.  Interventions attempted: Rest, hydration, or home remedies.  Symptoms are: unchanged.  Triage Disposition: See HCP Within 4 Hours (Or PCP Triage)  Patient/caregiver understands and will follow disposition?: No, refuses disposition  Reason for Disposition  Side (flank) or lower back pain present  Answer Assessment - Initial Assessment Questions Patient reports increased urgency, frequency, burning with urination and right flank pain that began 3 days ago. Patient unavailable for same day visit. Scheduled next day. Patient to call back if she is able to come in today and go to UC/ED if she develops worsening flank pain, nausea, vomiting.  1. SEVERITY:      Severe Saturday, less severe today  3. PATTERN: Is pain present every time you urinate or just sometimes?      Constant  4. ONSET: When did the painful urination start?      10/10/23  5. FEVER: Do you have a fever? If Yes, ask: What is your temperature, how was it measured, and when did it start?     Denies  8. OTHER SYMPTOMS: Do you have any other symptoms? (e.g., blood in urine, flank pain, genital sores, urgency, vaginal discharge)     Right sided flank pain  Protocols used: Urination Pain - Female-A-AH Copied from CRM #[id number]. Topic: Clinical - Red Word Triage >> Oct 12, 2023  1:36 PM Antony RAMAN wrote: Red Word that prompted transfer to Nurse Triage: side/abdominal pain and painful to pee

## 2023-10-13 ENCOUNTER — Encounter: Payer: Self-pay | Admitting: Family Medicine

## 2023-10-13 ENCOUNTER — Ambulatory Visit: Admitting: Family Medicine

## 2023-10-13 VITALS — BP 124/86 | HR 73 | Temp 98.0°F | Ht 69.0 in | Wt 234.0 lb

## 2023-10-13 DIAGNOSIS — B9689 Other specified bacterial agents as the cause of diseases classified elsewhere: Secondary | ICD-10-CM

## 2023-10-13 DIAGNOSIS — R300 Dysuria: Secondary | ICD-10-CM | POA: Diagnosis not present

## 2023-10-13 DIAGNOSIS — N760 Acute vaginitis: Secondary | ICD-10-CM | POA: Diagnosis not present

## 2023-10-13 DIAGNOSIS — Z23 Encounter for immunization: Secondary | ICD-10-CM | POA: Diagnosis not present

## 2023-10-13 LAB — URINALYSIS, ROUTINE W REFLEX MICROSCOPIC
Bacteria, UA: NONE SEEN /HPF
Bilirubin Urine: NEGATIVE
Glucose, UA: NEGATIVE
Hgb urine dipstick: NEGATIVE
Hyaline Cast: NONE SEEN /LPF
Ketones, ur: NEGATIVE
Nitrite: NEGATIVE
Protein, ur: NEGATIVE
RBC / HPF: NONE SEEN /HPF (ref 0–2)
Specific Gravity, Urine: 1.015 (ref 1.001–1.035)
pH: 7 (ref 5.0–8.0)

## 2023-10-13 LAB — MICROSCOPIC MESSAGE

## 2023-10-13 MED ORDER — NITROFURANTOIN MONOHYD MACRO 100 MG PO CAPS: 100.0000 mg | ORAL_CAPSULE | Freq: Two times a day (BID) | ORAL | 0 refills | Status: AC

## 2023-10-13 MED ORDER — PHENAZOPYRIDINE HCL 100 MG PO TABS: 100.0000 mg | ORAL_TABLET | Freq: Three times a day (TID) | ORAL | 0 refills | Status: AC | PRN

## 2023-10-13 NOTE — Progress Notes (Unsigned)
 Patient Office Visit  Assessment & Plan:  Dysuria -     Urinalysis, Routine w reflex microscopic -     Urine Culture; Future -     Nitrofurantoin Monohyd Macro; Take 1 capsule (100 mg total) by mouth 2 (two) times daily for 5 days.  Dispense: 10 capsule; Refill: 0 -     Phenazopyridine HCl; Take 1 tablet (100 mg total) by mouth 3 (three) times daily as needed for up to 7 days for pain.  Dispense: 21 tablet; Refill: 0 -     Microscopic Message  Bacterial vaginosis   Assessment and Plan    Urinary tract infection Recurrent UTI with dysuria, malodorous urine, and myalgia. Urinalysis shows trace bacteria and few WBCs. Differential includes bacterial vs yeast infection. Culture pending. - Prescribed Macrobid. - Prescribed Pyridium with counseling on orange urine side effect. - Sent urine for culture. - Advised increased water intake. - Instructed to monitor for fever, chills, nausea, or vomiting and seek medical attention if these occur.          Return if symptoms worsen or fail to improve.   Subjective:    Patient ID: Janet [name], female    DOB: March 17, 1985  Age: 38 y.o. MRN: [id number]  Chief Complaint  Patient presents with   Dysuria    Pt c/o of dysuria along with abdominal pain and an odor since Sunday.    Dysuria    Discussed the use of AI scribe software for clinical note transcription with the patient, who gave verbal consent to proceed.  History of Present Illness        Janet [name] is a 38 year old female with a history of recurrent urinary tract infections  and bacterial vaginosis who presents with urinary burning since Friday.  She has been experiencing urinary burning since Friday, accompanied by body aches but no fever or chills. She has been consuming a lot of fluids, including cranberry juice, and has been taking Tylenol  since Friday night to manage the body aches.  She describes the urine as having a 'fishy' odor and denies seeing blood in the  urine. She has a history of recurrent urinary tract infections, occurring two to three times a year, and has previously been treated with boric acid for bacterial vaginosis. She recalls a past kidney infection in 2014 while in [place], which required hospitalization. She denies any recent antibiotic use and is allergic to NSAIDs.  She experiences urinary hesitancy and feels the need to urinate again shortly after voiding, but does not wake up at night to urinate. Urination provides temporary relief from discomfort.  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 Physical Exam Results LABS   Urinalysis: Nitrite negative, trace bacteriuria, few leukocytes (10/13/2023) Assessment & Plan Urinary tract infection Recurrent UTI with dysuria, malodorous urine, and myalgia. Urinalysis shows trace bacteria and few WBCs. Differential includes bacterial vs yeast infection. Culture pending. - Prescribed Macrobid. - Prescribed Pyridium with counseling on orange urine side effect. - Sent urine for culture. - Advised increased water intake. - Instructed to monitor for fever, chills, nausea, or vomiting and seek medical attention if these occur.    The ASCVD Risk score (Arnett DK, et al., 2019) failed to calculate for the following reasons:   The 2019 ASCVD risk score is only valid for ages 80 to 49  Past Medical History:  Diagnosis Date   Abdominal pain 05/06/2021   Anemia  Asthma    Bilateral knee pain 10/22/2020   Depression    Finger pain, left 11/26/2017   Gestational diabetes mellitus, antepartum 08/12/2016   Moved pt to [facility] GDM program Growth US  at 38 week EFW 7#13oz, 82nd%   Left wrist pain 06/18/2020   Migraines    Right wrist pain 11/26/2017   Skin tag 05/11/2020   Past Surgical History:  Procedure Laterality Date   CESAREAN SECTION N/A 10/17/2016   Procedure: REPEAT CESAREAN SECTION;  Surgeon: Eveline Lynwood MATSU, MD;   Location: [facility] BIRTHING SUIT[facility];  Service: Obstetrics;  Laterality: N/A;   CESAREAN SECTION     DILATION AND CURETTAGE OF UTERUS     Social History   Tobacco Use   Smoking status: Never   Smokeless tobacco: Never  Vaping Use   Vaping status: Never Used  Substance Use Topics   Alcohol use: Not Currently    Comment: once every few months   Drug use: No   Family History  Problem Relation Age of Onset   Hypertension Mother    Heart disease Mother    Diabetes Father    Breast cancer Maternal Aunt 70   Sleep apnea Neg Hx    Allergies  Allergen Reactions   Ibuprofen Swelling    Facial swelling   Nsaids Other (See Comments)    Stomach ulcers   Ondansetron  Hcl Other (See Comments)    Pt states it made me crazy  Pt states it made me crazy    Pt states it made me crazy Pt states it made me crazy   Other Other (See Comments)    Other reaction(s): Other (See Comments)  Stomach ulcers  Other reaction(s): Other (See Comments) Stomach ulcers    Other reaction(s): Other (See Comments) Stomach ulcers   Levofloxacin In D5w Rash and Itching   Metronidazole  Itching   Tramadol  Itching    Review of Systems  Genitourinary:  Positive for dysuria.      Objective:    BP 124/86   Pulse 73   Temp 98 F (36.7 C)   Ht 5' 9 (1.753 m)   Wt 234 lb (106.1 kg)   LMP 09/23/2023 (Approximate)   SpO2 99%   BMI 34.56 kg/m  BP Readings from Last 3 Encounters:  10/13/23 124/86  09/30/23 138/84  08/20/23 121/78   Wt Readings from Last 3 Encounters:  10/13/23 234 lb (106.1 kg)  09/30/23 238 lb (108 kg)  08/20/23 244 lb (110.7 kg)    Physical Exam Vitals and nursing note reviewed.  Constitutional:      Appearance: Normal appearance.  HENT:     Head: Normocephalic.     Right Ear: Tympanic membrane, ear canal and external ear normal.     Left Ear: Tympanic membrane, ear canal and external ear normal.  Eyes:     Extraocular Movements: Extraocular movements intact.      Conjunctiva/sclera: Conjunctivae normal.     Pupils: Pupils are equal, round, and reactive to light.  Cardiovascular:     Rate and Rhythm: Normal rate and regular rhythm.     Heart sounds: Normal heart sounds.  Pulmonary:     Effort: Pulmonary effort is normal.     Breath sounds: Normal breath sounds.  Musculoskeletal:     Right lower leg: No edema.     Left lower leg: No edema.  Neurological:     General: No focal deficit present.     Mental Status: She is alert and oriented to person,  place, and time.  Psychiatric:        Mood and Affect: Mood normal.        Behavior: Behavior normal.        Thought Content: Thought content normal.        Judgment: Judgment normal.      Results for orders placed or performed in visit on 10/13/23  Urinalysis, Routine w reflex microscopic  Result Value Ref Range   Color, Urine YELLOW YELLOW   APPearance CLEAR CLEAR   Specific Gravity, Urine 1.015 1.001 - 1.035   pH 7.0 5.0 - 8.0   Glucose, UA NEGATIVE NEGATIVE   Bilirubin Urine NEGATIVE NEGATIVE   Ketones, ur NEGATIVE NEGATIVE   Hgb urine dipstick NEGATIVE NEGATIVE   Protein, ur NEGATIVE NEGATIVE   Nitrite NEGATIVE NEGATIVE   Leukocytes,Ua TRACE (A) NEGATIVE   WBC, UA 0-5 0 - 5 /HPF   RBC / HPF NONE SEEN 0 - 2 /HPF   Squamous Epithelial / HPF 0-5 < OR = 5 /HPF   Bacteria, UA NONE SEEN NONE SEEN /HPF   Hyaline Cast NONE SEEN NONE SEEN /LPF  Microscopic Message  Result Value Ref Range   Note

## 2023-10-14 ENCOUNTER — Encounter: Payer: Self-pay | Admitting: Family Medicine

## 2023-10-15 ENCOUNTER — Ambulatory Visit: Payer: Self-pay | Admitting: Family Medicine

## 2023-10-16 ENCOUNTER — Other Ambulatory Visit: Payer: Self-pay

## 2023-10-16 LAB — URINE CULTURE
MICRO NUMBER:: 17036085
SPECIMEN QUALITY:: ADEQUATE

## 2023-10-16 MED ORDER — SULFAMETHOXAZOLE-TRIMETHOPRIM 800-160 MG PO TABS: 1.0000 | ORAL_TABLET | Freq: Two times a day (BID) | ORAL | 0 refills | Status: AC

## 2023-10-19 ENCOUNTER — Telehealth (HOSPITAL_COMMUNITY): Payer: Self-pay

## 2023-10-19 ENCOUNTER — Other Ambulatory Visit: Payer: Self-pay

## 2023-10-19 ENCOUNTER — Other Ambulatory Visit (HOSPITAL_COMMUNITY): Payer: Self-pay

## 2023-10-19 NOTE — Telephone Encounter (Signed)
 Pharmacy Patient Advocate Encounter   Received notification from Pt Calls Messages that prior authorization for Zepbound  7.5MG /0.5ML pen-injectors  is required/requested.   Insurance verification completed.   The patient is insured through [employer].   Per test claim: PA required; PA submitted to above mentioned insurance via Latent Key/confirmation #/EOC [id number] Status is pending

## 2023-10-19 NOTE — Telephone Encounter (Signed)
 PA request has been Received. New Encounter has been or will be created for follow up. For additional info see Pharmacy Prior Auth telephone encounter from 10/19/23.

## 2023-10-19 NOTE — Telephone Encounter (Signed)
 Pharmacy Patient Advocate Encounter  Received notification from [employer] that Prior Authorization for Zepbound  7.5MG /0.5ML pen-injectors  has been DENIED.  See denial reason below. No denial letter attached in CMM. Will attach denial letter to Media tab once received.   PA #/Case ID/Reference #: [id number]

## 2023-10-20 ENCOUNTER — Other Ambulatory Visit: Payer: Self-pay | Admitting: Family Medicine

## 2023-10-20 NOTE — Telephone Encounter (Unsigned)
 Copied from CRM #[id number]. Topic: Clinical - Medication Refill >> Oct 20, 2023 12:08 PM Tiffini S wrote: Medication: tirzepatide  (ZEPBOUND ) 7.5 MG/0.5ML Pen  Has the patient contacted their pharmacy? Yes, need prior authorization  (Agent: If no, request that the patient contact the pharmacy for the refill. If patient does not wish to contact the pharmacy document the reason why and proceed with request.) (Agent: If yes, when and what did the pharmacy advise?)  This is the patient's preferred pharmacy:     - [facility] [address] [place] [address] Phone: [phone] Fax: [phone]  Is this the correct pharmacy for this prescription? Yes If no, delete pharmacy and type the correct one.   Has the prescription been filled recently? Yes  Is the patient out of the medication? Yes, took last pen last week   Has the patient been seen for an appointment in the last year OR does the patient have an upcoming appointment? Yes  Can we respond through MyChart? No, please call patient at [phone]  Agent: Please be advised that Rx refills may take up to 3 business days. We ask that you follow-up with your pharmacy.

## 2023-10-21 ENCOUNTER — Telehealth: Payer: Self-pay

## 2023-10-21 ENCOUNTER — Encounter: Payer: Self-pay | Admitting: Certified Nurse Midwife

## 2023-10-21 ENCOUNTER — Ambulatory Visit (INDEPENDENT_AMBULATORY_CARE_PROVIDER_SITE_OTHER): Admitting: Certified Nurse Midwife

## 2023-10-21 VITALS — BP 121/86 | HR 65 | Ht 69.0 in | Wt 235.0 lb

## 2023-10-21 DIAGNOSIS — Z30017 Encounter for initial prescription of implantable subdermal contraceptive: Secondary | ICD-10-CM

## 2023-10-21 DIAGNOSIS — Z3202 Encounter for pregnancy test, result negative: Secondary | ICD-10-CM | POA: Diagnosis not present

## 2023-10-21 DIAGNOSIS — Z3009 Encounter for other general counseling and advice on contraception: Secondary | ICD-10-CM

## 2023-10-21 LAB — POCT URINE PREGNANCY: Preg Test, Ur: NEGATIVE

## 2023-10-21 MED ORDER — ETONOGESTREL 68 MG ~~LOC~~ IMPL
68.0000 mg | DRUG_IMPLANT | Freq: Once | SUBCUTANEOUS | Status: AC
  Administered 2023-10-21: 68 mg via SUBCUTANEOUS

## 2023-10-21 NOTE — Telephone Encounter (Signed)
 Copied from CRM #[id number]. Topic: Clinical - Prescription Issue >> Oct 21, 2023  3:57 PM Fonda T wrote: Reason for CRM: Patient calling to check status of PA for medication, tirzepatide  (ZEPBOUND ) 7.5 MG/0.5ML Pen.  Per patient states she is currently out of medication.  Per chart review/message on 10/19/23, per message indicates:  Hi Janet [name],   We have received information regarding your Zepbound  prescription. Medicaid will not cover this medication. I have copied and pasted the information below. Thank you.   Janet [name]   Pharmacy Patient Advocate Encounter  Received notification from [employer] that Prior Authorization for Zepbound  7.5MG /0.5ML pen-injectors  has been DENIED.  See denial reason below. No denial letter attached in CMM. Will attach denial letter to Media tab once received.  PA #/Case ID/Reference #: [id number]   Patient informed of above, per patient states insurance company also sent an appeal to office as well, and wants to know the update if received a response from appeal.   Patient can be reached [phone], for additional questions she has regarding PA.

## 2023-10-21 NOTE — Progress Notes (Signed)
 GYNECOLOGY CLINIC PROCEDURE NOTE  Ms. Janet [name] is a 38 y.o. [phone] here for Nexplanon  insertion. No GYN concerns.  Last pap smear was on 03/2023 and was normal.  No other gynecologic concerns.  Nexplanon  Insertion Procedure Patient was given informed consent, she signed consent form.  Patient does understand that irregular bleeding is a very common side effect of this medication. She was advised to have backup contraception for one week after placement. Pregnancy test in clinic today was negative.  Appropriate time out taken.  Patient's left arm was prepped and draped in the usual sterile fashion.. The ruler used to measure and mark insertion area.  Patient was prepped with alcohol swab and then injected with 3 ml of 1% lidocaine .  She was prepped with betadine, Nexplanon  removed from packaging,  Device confirmed in needle, then inserted full length of needle and withdrawn per handbook instructions. Nexplanon  was able to palpated in the patient's arm; patient palpated the insert herself. There was minimal blood loss.  Patient insertion site covered with guaze and a pressure bandage to reduce any bruising.  The patient tolerated the procedure well and was given post procedure instructions.    Results for orders placed or performed in visit on 10/21/23 (from the past 24 hours)  POCT urine pregnancy     Status: Normal   Collection Time: 10/21/23 10:19 AM  Result Value Ref Range   Preg Test, Ur Negative Negative    Lot: [id number]  Exp: 2026-10    Emilio Delilah HERO, CNM 10/21/2023 10:33 AM

## 2023-10-22 ENCOUNTER — Telehealth (HOSPITAL_COMMUNITY): Payer: Self-pay

## 2023-10-22 ENCOUNTER — Encounter (HOSPITAL_COMMUNITY): Payer: Self-pay

## 2023-10-22 ENCOUNTER — Other Ambulatory Visit (HOSPITAL_COMMUNITY): Payer: Self-pay

## 2023-10-22 MED ORDER — ZEPBOUND 7.5 MG/0.5ML ~~LOC~~ SOAJ
7.5000 mg | SUBCUTANEOUS | 2 refills | Status: DC
  Filled 2023-10-22: qty 2, 28d supply, fill #0

## 2023-10-22 NOTE — Telephone Encounter (Signed)
 Addressed in separate TE

## 2023-10-22 NOTE — Telephone Encounter (Signed)
 Pharmacy Patient Advocate Encounter   Received notification from Pt Calls Messages that prior authorization for Zepbound  7.5 mg/0.5 ml auto injectors is required/requested.   Insurance verification completed.   The patient is insured through [employer].   Per test claim: Effective October 1st, Medicaid will discontinue coverage of GLP1 medications for weight loss (such as Wegovy  and Zepbound ), unless the patient has a documented history of a heart attack or stroke. Zepbound  will continue to be covered only for patients with moderate to severe sleep apnea (AHI 15-30) and a BMI greater than 40. Because of this change, the prior authorization team will not be submitting new PA requests for GLP1 medications prescribed for weight loss, as patients will be unable to continue therapy under Medicaid coverage.

## 2023-10-22 NOTE — Telephone Encounter (Signed)
 PA request has been Received. New Encounter has been or will be created for follow up. For additional info see Pharmacy Prior Auth telephone encounter from 10/22/23.

## 2023-10-22 NOTE — Telephone Encounter (Signed)
 Requested medication (s) are due for refill today: yes  Requested medication (s) are on the active medication list: yes  Last refill:  08/20/23  Future visit scheduled: yes  Notes to clinic:  Medication not assigned to a protocol, review manually.      Requested Prescriptions  Pending Prescriptions Disp Refills   tirzepatide  (ZEPBOUND ) 7.5 MG/0.5ML Pen 2 mL 2    Sig: Inject 7.5 mg into the skin once a week.     Off-Protocol Failed - 10/22/2023 10:40 AM      Failed - Medication not assigned to a protocol, review manually.      Passed - Valid encounter within last 12 months    Recent Outpatient Visits           1 week ago Dysuria   [place] [facility] Aletha Bene, MD   2 months ago Morbid obesity [facility])   [place] [facility] Kayla Jeoffrey RAMAN, FNP   3 months ago Morbid obesity [facility])   [place] [facility] Kayla Jeoffrey RAMAN, FNP   3 months ago Acute pain of left shoulder   [place] [facility] Aletha Bene, MD   4 months ago Physical exam, annual   [place] [facility] Kayla Jeoffrey RAMAN, FNP

## 2023-10-25 DIAGNOSIS — Z419 Encounter for procedure for purposes other than remedying health state, unspecified: Secondary | ICD-10-CM | POA: Diagnosis not present

## 2023-11-07 ENCOUNTER — Emergency Department

## 2023-11-07 ENCOUNTER — Emergency Department
Admission: EM | Admit: 2023-11-07 | Discharge: 2023-11-07 | Disposition: A | Discharge status: Home / Self Care | Attending: Emergency Medicine | Admitting: Emergency Medicine

## 2023-11-07 ENCOUNTER — Other Ambulatory Visit: Payer: Self-pay

## 2023-11-07 DIAGNOSIS — R1030 Lower abdominal pain, unspecified: Secondary | ICD-10-CM | POA: Diagnosis not present

## 2023-11-07 DIAGNOSIS — M5441 Lumbago with sciatica, right side: Secondary | ICD-10-CM | POA: Diagnosis not present

## 2023-11-07 DIAGNOSIS — M461 Sacroiliitis, not elsewhere classified: Secondary | ICD-10-CM

## 2023-11-07 DIAGNOSIS — M545 Low back pain, unspecified: Secondary | ICD-10-CM | POA: Diagnosis not present

## 2023-11-07 DIAGNOSIS — J45909 Unspecified asthma, uncomplicated: Secondary | ICD-10-CM | POA: Diagnosis not present

## 2023-11-07 DIAGNOSIS — M5126 Other intervertebral disc displacement, lumbar region: Secondary | ICD-10-CM

## 2023-11-07 DIAGNOSIS — K5900 Constipation, unspecified: Secondary | ICD-10-CM

## 2023-11-07 DIAGNOSIS — M5440 Lumbago with sciatica, unspecified side: Secondary | ICD-10-CM

## 2023-11-07 LAB — COMPREHENSIVE METABOLIC PANEL WITH GFR
ALT: 21 U/L (ref 0–44)
AST: 22 U/L (ref 15–41)
Albumin: 4.5 g/dL (ref 3.5–5.0)
Alkaline Phosphatase: 51 U/L (ref 38–126)
Anion gap: 14 (ref 5–15)
BUN: 15 mg/dL (ref 6–20)
CO2: 23 mmol/L (ref 22–32)
Calcium: 9.4 mg/dL (ref 8.9–10.3)
Chloride: 104 mmol/L (ref 98–111)
Creatinine, Ser: 0.85 mg/dL (ref 0.44–1.00)
GFR, Estimated: >60 mL/min (ref >60)
Glucose, Bld: 96 mg/dL (ref 70–99)
Potassium: 3.8 mmol/L (ref 3.5–5.1)
Sodium: 141 mmol/L (ref 135–145)
Total Bilirubin: 0.8 mg/dL (ref 0.0–1.2)
Total Protein: 8.1 g/dL (ref 6.5–8.1)

## 2023-11-07 LAB — CBC
HCT: 37.5 % (ref 36.0–46.0)
Hemoglobin: 13 g/dL (ref 12.0–15.0)
MCH: 28.2 pg (ref 26.0–34.0)
MCHC: 34.7 g/dL (ref 30.0–36.0)
MCV: 81.3 fL (ref 80.0–100.0)
Platelets: 274 K/uL (ref 150–400)
RBC: 4.61 MIL/uL (ref 3.87–5.11)
RDW: 13.8 % (ref 11.5–15.5)
WBC: 5.9 K/uL (ref 4.0–10.5)
nRBC: 0 % (ref 0.0–0.2)

## 2023-11-07 LAB — URINALYSIS, ROUTINE W REFLEX MICROSCOPIC
Bilirubin Urine: NEGATIVE
Glucose, UA: NEGATIVE mg/dL
Hgb urine dipstick: NEGATIVE
Ketones, ur: NEGATIVE mg/dL
Leukocytes,Ua: NEGATIVE
Nitrite: NEGATIVE
Protein, ur: NEGATIVE mg/dL
Specific Gravity, Urine: 1.015 (ref 1.005–1.030)
pH: 6 (ref 5.0–8.0)

## 2023-11-07 LAB — LIPASE, BLOOD: Lipase: 33 U/L (ref 11–51)

## 2023-11-07 LAB — POC URINE PREG, ED: Preg Test, Ur: NEGATIVE

## 2023-11-07 MED ORDER — OXYCODONE HCL 5 MG PO TABS: 5.0000 mg | ORAL_TABLET | Freq: Three times a day (TID) | ORAL | 0 refills | Status: DC | PRN

## 2023-11-07 MED ORDER — ACETAMINOPHEN 500 MG PO TABS
1000.0000 mg | ORAL_TABLET | Freq: Once | ORAL | Status: AC
  Administered 2023-11-07: 1000 mg via ORAL
  Filled 2023-11-07: qty 2

## 2023-11-07 MED ORDER — LIDOCAINE 5 % EX PTCH: 1.0000 | MEDICATED_PATCH | CUTANEOUS | 0 refills | Status: DC

## 2023-11-07 MED ORDER — POLYETHYLENE GLYCOL 3350 17 G PO PACK: 17.0000 g | PACK | Freq: Every day | ORAL | 0 refills | Status: AC

## 2023-11-07 MED ORDER — LIDOCAINE 5 % EX PTCH
1.0000 | MEDICATED_PATCH | CUTANEOUS | Status: DC
  Administered 2023-11-07: 1 via TRANSDERMAL
  Filled 2023-11-07: qty 1

## 2023-11-07 MED ORDER — IOHEXOL 300 MG/ML  SOLN
100.0000 mL | Freq: Once | INTRAMUSCULAR | Status: AC | PRN
  Administered 2023-11-07: 100 mL via INTRAVENOUS

## 2023-11-07 MED ORDER — MORPHINE SULFATE (PF) 4 MG/ML IV SOLN
4.0000 mg | Freq: Once | INTRAVENOUS | Status: AC
  Administered 2023-11-07: 4 mg via INTRAVENOUS
  Filled 2023-11-07: qty 1

## 2023-11-07 MED ORDER — HYDROMORPHONE HCL 1 MG/ML IJ SOLN
0.5000 mg | Freq: Once | INTRAMUSCULAR | Status: AC
  Administered 2023-11-07: 0.5 mg via INTRAVENOUS
  Filled 2023-11-07: qty 0.5

## 2023-11-07 NOTE — ED Triage Notes (Signed)
 Pt to ED for lower bilateral abdominal pain and lower back pain radiating into both buttocks since 0500 today. No dysuria.

## 2023-11-07 NOTE — ED Notes (Signed)
 IV team bedside at this time.

## 2023-11-07 NOTE — ED Provider Notes (Signed)
.-----------------------------------------   3:32 PM on 11/07/2023 -----------------------------------------  Blood pressure 133/89, pulse 71, temperature 98.2 F (36.8 C), temperature source Oral, resp. rate 16, height 5' 9 (1.753 m), weight 106.1 kg, last menstrual period 10/18/2023, SpO2 100%.  Assuming care from Dr. Dorothyann.  In short, Janet [name] is a 38 y.o. female with a chief complaint of Abdominal Pain and Back Pain .  Refer to the original H&P for additional details.  The current plan of care is to follow-up CT and labs, if negative for intra-abdominal source, likely sciatica can can be discharged with pain meds and outpatient follow-up.  On reassessment patient controlled at this time.  Will give her some Tylenol  as well as Lidoderm  patches.  CT shows large stool burden, small but local hernia and adhesions, also showed disc herniation to the L4 and L5 with moderate severe spinal canal stenosis, also sacroiliitis.  Patient denies any recent instrumentation, no fever or midline back pain, no IV drug use, no recent infections, no focal weakness or numbness or saddle anesthesia or incontinence.  Did discuss with her about imaging lab results, incidental findings.  Will give her some MiraLAX  that she can take for the constipation, will also give her a short prescription of oxycodone  for severe breakthrough pain.  Will also prescribed Lidoderm  patches.  Otherwise considered but no indication for inpatient admission at this time, she safe for outpatient management.  Will give her number to call for neurosurgery to follow-up, also instructed her to follow-up with primary care doctor next week.  Shared decision making done with patient and she is agreeable with this plan.  Discharge.   Clinical Course as of 11/07/23 1833  Sat Nov 07, 2023  1648 Independent review of labs, pregnancy test is negative, lipase is not elevated, electrolytes not severely deranged, LFTs are normal, no leukocytosis.  [TT]  1708 Urinalysis, Routine w reflex microscopic -Urine, Clean Catch(!) Not consistent with UTI [TT]  1753 CT ABDOMEN PELVIS W CONTRAST IMPRESSION: 1. No acute findings. 2. Large stool burden throughout the colon. 3. Small umbilical hernia containing nondilated colon and mesenteric fat. 4. Likely adhesions involving the anterior portion of the uterus to the anterior abdominal wall, unchanged from prior.   [TT]  1753 CT L-SPINE NO CHARGE IMPRESSION: 1. 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 2. Findings compatible with sacroiliitis.   [TT]    Clinical Course User Index [TT] Waymond Lorelle Cummins, MD      Waymond Lorelle Cummins, MD 11/07/23 [phone]

## 2023-11-07 NOTE — ED Provider Notes (Signed)
 [facility] Provider Note    Event Date/Time   First MD Initiated Contact with Patient 11/07/23 1331     (approximate)  History   Chief Complaint: Abdominal Pain and Back Pain  HPI  Zetha Kuhar is a 38 y.o. female with a past medical history of anemia, asthma, presents to the emergency department for lower abdominal and lower back pain.  According to the patient since awakening early this morning she has been experiencing pain in the lower back that radiates across the lower abdomen at times radiates down both legs.  Patient denies any history of back pain besides 6 years ago after she delivered her child where she had several weeks of lower back pain that then resolved.  Patient denies any known inciting event.  No fever.  No urinary symptoms.  No vaginal bleeding or discharge.  Denies any vomiting or diarrhea.  Pain somewhat worse with movement and somewhat relieved by leaning forwards.  Physical Exam   Triage Vital Signs: ED Triage Vitals  Encounter Vitals Group     BP 11/07/23 1324 (!) 158/104     Girls Systolic BP Percentile --      Girls Diastolic BP Percentile --      Boys Systolic BP Percentile --      Boys Diastolic BP Percentile --      Pulse Rate 11/07/23 1324 84     Resp 11/07/23 1324 20     Temp 11/07/23 1324 98.2 F (36.8 C)     Temp Source 11/07/23 1324 Oral     SpO2 11/07/23 1324 100 %     Weight 11/07/23 1322 234 lb (106.1 kg)     Height 11/07/23 1322 5' 9 (1.753 m)     Head Circumference --      Peak Flow --      Pain Score 11/07/23 1321 10     Pain Loc --      Pain Education --      Exclude from Growth Chart --     Most recent vital signs: Vitals:   11/07/23 1324 11/07/23 1340  BP: (!) 158/104 (!) 163/112  Pulse: 84 81  Resp: 20   Temp: 98.2 F (36.8 C)   SpO2: 100% 100%    General: Awake, no distress.  CV:  Good peripheral perfusion.  Regular rate and rhythm  Resp:  Normal effort.  Equal breath sounds bilaterally.   Abd:  No distention.  Soft, mild lower abdominal tenderness to palpation. Other:  Moderate tenderness to palpation in the lower lumbar spine mostly midline.  Mild tenderness to the lumbar paraspinal regions.  ED Results / Procedures / Treatments   RADIOLOGY  CTs pending   MEDICATIONS ORDERED IN ED: Medications - No data to display   IMPRESSION / MDM / ASSESSMENT AND PLAN / ED COURSE  I reviewed the triage vital signs and the nursing notes.  Patient's presentation is most consistent with acute presentation with potential threat to life or bodily function.  Patient presents emergency department for lower abdominal pain as well as lower back pain.  States the pain radiates around from the lower back to the lower abdomen and also states at times pain shoots down her buttocks into her legs.  Symptoms seem very suggestive of sciatica however the patient denies any history of sciatica previously, no traumatic event.  Given the lower abdominal tenderness we will check labs including urinalysis will obtain a CT scan of the abdomen as well as  the lower back.  We will continue close monitor while awaiting results.  Patient agreeable to plan of care.  Lab work shows reassuring CBC, reassuring chemistry normal LFTs and lipase.  Urinalysis is normal.  CT scans pending.  FINAL CLINICAL IMPRESSION(S) / ED DIAGNOSES   Lower abdominal pain Lower back pain   Note:  This document was prepared using Dragon voice recognition software and may include unintentional dictation errors.   Dorothyann Drivers, MD 11/08/23 1453

## 2023-11-07 NOTE — ED Notes (Signed)
Instructed pt to give urine sample.

## 2023-11-07 NOTE — ED Notes (Signed)
 Attempted IV w/ out success. Per pt she is a hard stick.

## 2023-11-07 NOTE — ED Notes (Signed)
 Caled portable for auquatherapy pad- state they do not have it. Warm blanket given to pt for comfort.

## 2023-11-07 NOTE — ED Notes (Addendum)
 Pt reports pain has improved w medication see MAR

## 2023-11-07 NOTE — Discharge Instructions (Signed)
 You can take 650 mg of Tylenol  every 6 hours as needed for pain.  Please reserve the oxycodone  for severe breakthrough pain.  He can also use the Lidoderm  patches as prescribed.  For your constipation please take 1 packet of MiraLAX  a day until you are stooling daily.  I have put in number for you to call to follow-up with neurosurgery.  Please make sure to also follow-up with your primary care doctor next week to get reassessed.

## 2023-11-19 ENCOUNTER — Encounter: Payer: Self-pay | Admitting: Family Medicine

## 2023-11-19 ENCOUNTER — Ambulatory Visit: Admitting: Family Medicine

## 2023-11-19 VITALS — BP 122/81 | HR 83 | Temp 98.0°F | Ht 69.0 in | Wt 240.2 lb

## 2023-11-19 DIAGNOSIS — M48061 Spinal stenosis, lumbar region without neurogenic claudication: Secondary | ICD-10-CM

## 2023-11-19 DIAGNOSIS — K429 Umbilical hernia without obstruction or gangrene: Secondary | ICD-10-CM

## 2023-11-19 DIAGNOSIS — M461 Sacroiliitis, not elsewhere classified: Secondary | ICD-10-CM | POA: Diagnosis not present

## 2023-11-19 NOTE — Progress Notes (Signed)
 Acute Office Visit  Patient ID: Janet [name], female    DOB: [birth year], 38 y.o.   MRN: [id number]  PCP: Kayla Jeoffrey RAMAN, FNP  Chief Complaint  Patient presents with   Hospitalization Follow-up    11/07/23 ED visit : Acute low back pain with sciatica, sciatica laterality unspecified, unspecified back pain laterality     Subjective:     HPI Janet [name] is here today for ED follow up for abdominal and low back pain. During this admission CT scan revealed large stool burden, small umbilical hernia containing nondilated colon and mesenteric fat, uterine adhesions. CMP, CBC, pregnancy test, UA all unremarkable. CT lumbar showed multilevel degenerative changes and disc herniation at L4-:5 with moderate to severe canal stenosis. She was treated with miralax , lidocaine  patches, oxycodone . Pain is unchanged and she requests referral to Neurosurgery.   Discussed the use of AI scribe software for clinical note transcription with the patient, who gave verbal consent to proceed.  History of Present Illness Janet [name] is a 38 year old female who presents with back and abdominal pain.  She experiences severe back pain radiating down both legs into her hips and buttocks, exacerbated by standing up after sitting or sitting straight on her bed. There is no leg weakness, difficulty walking, numbness, or tingling, except for occasional numbness around her waist when the pain is intense. No incontinence of urine or stool. The pain is persistent and stable, not improving with current treatments.  She had a CT scan in the emergency room; she was told she was 'full of poop,' but was not told of any other findings at that time. She was prescribed lidocaine  patches, oxycodone , and Miralax . The lidocaine  patches and oxycodone  did not alleviate her pain, and oxycodone  caused drowsiness and nausea. She has not been taking Miralax , opting instead for a detox tea every night or every other night to aid bowel  movements.  In addition to back pain, she experiences abdominal pain similar to menstrual cramps. A CT scan showed a small umbilical hernia, which the patient was not previously aware of. She feels nauseated when experiencing abdominal pain, which occurs consistently in the mornings and feels like reflux, though it resolves after a few minutes. She has not been taking any medication for this symptom.  She reports chronic constipation, with bowel movements occurring every three to four days and requiring significant effort. She dislikes the taste of Miralax  and prefers not to use it. She consumes a diet high in fiber but acknowledges that her water intake may be insufficient. She has not tried Metamucil or other daily medications for constipation.  No fever, chills, or body aches, although she does experience body aches. She is still able to work despite her symptoms.   Review of Systems  All other systems reviewed and are negative.   Past Medical History:  Diagnosis Date   Abdominal pain 05/06/2021   Anemia    Asthma    Bilateral knee pain 10/22/2020   Depression    Finger pain, left 11/26/2017   Gestational diabetes mellitus, antepartum 08/12/2016   Moved pt to BRX GDM program Growth US  at 38 week EFW 7#13oz, 82nd%   Left wrist pain 06/18/2020   Migraines    Right wrist pain 11/26/2017   Skin tag 05/11/2020    Past Surgical History:  Procedure Laterality Date   CESAREAN SECTION N/A 10/17/2016   Procedure: REPEAT CESAREAN SECTION;  Surgeon: Eveline Lynwood MATSU, MD;  Location: [facility]TH[facility] SU[facility];  Service:  Obstetrics;  Laterality: N/A;   CESAREAN SECTION     DILATION AND CURETTAGE OF UTERUS      Current Outpatient Medications on File Prior to Visit  Medication Sig Dispense Refill   albuterol  (VENTOLIN  HFA) 108 (90 Base) MCG/ACT inhaler Inhale 2 puffs into the lungs every 6 (six) hours as needed for wheezing or shortness of breath. 8 g 2   albuterol  (VENTOLIN  HFA) 108 (90 Base)  MCG/ACT inhaler Inhale 2 puffs into the lungs every 6 (six) hours as needed for wheezing or shortness of breath. (Patient not taking: Reported on 11/19/2023) 18 g 2   diclofenac  Sodium (VOLTAREN  ARTHRITIS PAIN) 1 % GEL Apply 2 g topically daily. (Patient not taking: Reported on 11/19/2023) 100 g 1   Etonogestrel  (NEXPLANON  [place]) Inject into the skin.     gabapentin  (NEURONTIN ) 100 MG capsule START WITH 1 AT NIGHTTIME AND INCREASE UP TO 3 AT NIGHTTIME FOR PAIN (Patient not taking: Reported on 11/19/2023) 90 capsule 0   lidocaine  (LIDODERM ) 5 % Place 1 patch onto the skin daily. Remove & Discard patch within 12 hours or as directed by MD (Patient not taking: Reported on 11/19/2023) 30 patch 0   losartan  (COZAAR ) 100 MG tablet Take 1 tablet (100 mg total) by mouth daily. (Patient not taking: Reported on 10/21/2023) 90 tablet 1   losartan  (COZAAR ) 100 MG tablet Take 1 tablet (100 mg total) by mouth at bedtime. 90 tablet 1   methocarbamol  (ROBAXIN ) 750 MG tablet Take 1 tablet (750 mg total) by mouth every 8 (eight) hours as needed for muscle spasms. (Patient not taking: Reported on 11/19/2023) 30 tablet 1   oxyCODONE  (ROXICODONE ) 5 MG immediate release tablet Take 1 tablet (5 mg total) by mouth every 8 (eight) hours as needed. 12 tablet 0   polyethylene glycol (MIRALAX ) 17 g packet Take 17 g by mouth daily. 30 packet 0   predniSONE  (STERAPRED UNI-PAK 21 TAB) 10 MG (21) TBPK tablet Use as directed. (Patient not taking: Reported on 11/19/2023) 21 each 0   progesterone  (PROMETRIUM ) 100 MG capsule Take 1 capsule (100 mg total) by mouth at bedtime. (Patient not taking: Reported on 11/19/2023) 30 capsule 1   tirzepatide  (ZEPBOUND ) 7.5 MG/0.5ML Pen Inject 7.5 mg into the skin once a week. 2 mL 2   triamcinolone  (KENALOG ) 0.025 % ointment Apply 1 Application topically 2 (two) times daily. (Patient not taking: Reported on 11/19/2023) 30 g 0   No current facility-administered medications on file prior to visit.    Allergies   Allergen Reactions   Ibuprofen Swelling    Facial swelling   Nsaids Other (See Comments)    Stomach ulcers   Ondansetron  Hcl Other (See Comments)    Pt states it made me crazy  Pt states it made me crazy    Pt states it made me crazy Pt states it made me crazy   Other Other (See Comments)    Other reaction(s): Other (See Comments)  Stomach ulcers  Other reaction(s): Other (See Comments) Stomach ulcers    Other reaction(s): Other (See Comments) Stomach ulcers   Levofloxacin In D5w Rash and Itching   Metronidazole  Itching   Tramadol  Itching       Objective:    BP 122/81   Pulse 83   Temp 98 F (36.7 C)   Ht 5' 9 (1.753 m)   Wt 240 lb 3.2 oz (109 kg)   LMP 10/25/2023 (Exact Date)   SpO2 99%   BMI 35.47 kg/m  Physical Exam Vitals and nursing note reviewed.  Constitutional:      Appearance: Normal appearance. She is normal weight.  HENT:     Head: Normocephalic and atraumatic.  Abdominal:     General: Bowel sounds are normal.     Palpations: Abdomen is soft. There is no mass.     Tenderness: There is no abdominal tenderness.     Hernia: No hernia is present.  Musculoskeletal:     Thoracic back: Normal.     Lumbar back: Normal.  Skin:    General: Skin is warm and dry.  Neurological:     General: No focal deficit present.     Mental Status: She is alert and oriented to person, place, and time. Mental status is at baseline.  Psychiatric:        Mood and Affect: Mood normal.        Behavior: Behavior normal.        Thought Content: Thought content normal.        Judgment: Judgment normal.       No results found for any visits on 11/19/23.     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 Previous treatments ineffective. CT scan performed. Referral to neurosurgery warranted. - Referred to neurosurgery for evaluation and management.  Umbilical hernia with bowel involvement Small umbilical hernia with bowel involvement identified on CT scan. Risk for bowel strangulation or obstruction discussed. Surgical intervention needed if symptoms develop. - Referred to general surgery for evaluation and potential repair.  Chronic constipation Hard stools, infrequent bowel movements, and reliance on detox teas. Miralax  not well tolerated. Discussed potential IBS constipation and need for consistent bowel regimen. - Recommended Miralax  as needed, mixed with apple juice or Gatorade. - Encouraged increased water intake and dietary fiber. - Consider Linzess for daily management if constipation persists.  Gastroesophageal reflux symptoms Intermittent reflux symptoms with morning nausea. - Recommended trial of Prilosec.    No orders of the defined types were placed in this encounter.   Return if symptoms worsen or fail to improve.  Jeoffrey GORMAN Barrio, FNP [facility]

## 2023-11-23 ENCOUNTER — Ambulatory Visit (INDEPENDENT_AMBULATORY_CARE_PROVIDER_SITE_OTHER): Admitting: Family Medicine

## 2023-11-23 ENCOUNTER — Encounter: Payer: Self-pay | Admitting: Family Medicine

## 2023-11-23 DIAGNOSIS — Z13220 Encounter for screening for lipoid disorders: Secondary | ICD-10-CM

## 2023-11-23 DIAGNOSIS — J45909 Unspecified asthma, uncomplicated: Secondary | ICD-10-CM | POA: Diagnosis not present

## 2023-11-23 DIAGNOSIS — K219 Gastro-esophageal reflux disease without esophagitis: Secondary | ICD-10-CM

## 2023-11-23 DIAGNOSIS — Z1321 Encounter for screening for nutritional disorder: Secondary | ICD-10-CM

## 2023-11-23 DIAGNOSIS — I10 Essential (primary) hypertension: Secondary | ICD-10-CM

## 2023-11-23 NOTE — Progress Notes (Signed)
 Acute Office Visit  Patient ID: Janet [name], female    DOB: [birth year], 38 y.o.   MRN: [id number]  PCP: Kayla Jeoffrey RAMAN, FNP  Chief Complaint  Patient presents with   Medical Management of Chronic Issues    3 month f/u      Subjective:     HPI  Discussed the use of AI scribe software for clinical note transcription with the patient, who gave verbal consent to proceed.  [name] here today for chronic condition management.  PMH includes obesity, HTN, GERD.  Obesity: on Zepbound  7.5mg  weekly Starting BMI: 36.49 Current BMI: 36.03 Goal weight: 175 Abd: 45in Diet: low calorie, high protein, keto Exercise: physically strenuous job, is doing PT three times weekly and exercising at home Medication: zepbound  7.5mg  weekly Side Effects: constipation  HTN: on losartan  100mg  daily,   GERD: diet controlled   History of Present Illness Janet [name] is a 38 year old female who presents with weight gain and associated symptoms after discontinuation of Zepbound .  She has experienced significant weight gain after discontinuing Zepbound  nearly two months ago due to Medicaid's discontinuation of coverage for GLP medications for weight loss. Her weight increased from 230 pounds to approximately 245 pounds. She attributes various symptoms to this weight gain, including mood swings, lack of sleep, decreased concentration, and back pain. She is concerned about the impact of weight gain on her overall health.  She follows a ketogenic diet, consuming almost zero carbs and substituting her food with healthier fiber options. She estimates her daily caloric intake to be less than 1500 calories, mentioning meals like yogurt with keto granola and fish or salad for dinner. Despite her dietary efforts, she has not been exercising due to time constraints from work and school commitments.  She has a history of migraines, experiencing them occasionally, with the most recent episode occurring yesterday.  She reports snoring and occasional headaches upon waking, but a previous sleep study indicated no sleep apnea.  She is currently taking losartan  100 mg for blood pressure and manages her acid reflux through diet control. She has a Nexplanon  implant and recently had an IUD removed and replaced. Her menstrual periods were heavy when off the implant but ceased upon re-implantation. She also mentions that weight loss previously led to the return of her periods.  She has difficulty focusing, which has been affecting her academic performance. This issue has been ongoing for about a year, and she is considering the possibility of having ADD, although she has never been diagnosed.   Review of Systems  All other systems reviewed and are negative.   Past Medical History:  Diagnosis Date   Abdominal pain 05/06/2021   Anemia    Asthma    Bilateral knee pain 10/22/2020   Depression    Finger pain, left 11/26/2017   Gestational diabetes mellitus, antepartum 08/12/2016   Moved pt to [facility]X GDM program Growth US  at 38 week EFW 7#13oz, 82nd%   Left wrist pain 06/18/2020   Migraines    Right wrist pain 11/26/2017   Skin tag 05/11/2020    Past Surgical History:  Procedure Laterality Date   CESAREAN SECTION N/A 10/17/2016   Procedure: REPEAT CESAREAN SECTION;  Surgeon: Eveline Lynwood MATSU, MD;  Location: [facility] SU[facility];  Service: Obstetrics;  Laterality: N/A;   CESAREAN SECTION     DILATION AND CURETTAGE OF UTERUS      Current Outpatient Medications on File Prior to Visit  Medication Sig Dispense Refill  albuterol  (VENTOLIN  HFA) 108 (90 Base) MCG/ACT inhaler Inhale 2 puffs into the lungs every 6 (six) hours as needed for wheezing or shortness of breath. 8 g 2   Etonogestrel  (NEXPLANON  [place]) Inject into the skin.     losartan  (COZAAR ) 100 MG tablet Take 1 tablet (100 mg total) by mouth daily. 90 tablet 1   oxyCODONE  (ROXICODONE ) 5 MG immediate release tablet Take 1 tablet (5 mg total) by mouth every 8  (eight) hours as needed. 12 tablet 0   polyethylene glycol (MIRALAX ) 17 g packet Take 17 g by mouth daily. 30 packet 0   No current facility-administered medications on file prior to visit.    Allergies  Allergen Reactions   Ibuprofen Swelling    Facial swelling   Nsaids Other (See Comments)    Stomach ulcers   Ondansetron  Hcl Other (See Comments)    Pt states it made me crazy  Pt states it made me crazy    Pt states it made me crazy Pt states it made me crazy   Other Other (See Comments)    Other reaction(s): Other (See Comments)  Stomach ulcers  Other reaction(s): Other (See Comments) Stomach ulcers    Other reaction(s): Other (See Comments) Stomach ulcers   Levofloxacin In D5w Rash and Itching   Metronidazole  Itching   Tramadol  Itching       Objective:    BP (!) 141/99   Pulse 85   Temp 98.5 F (36.9 C)   Ht 5' 9 (1.753 m)   Wt 243 lb 12.8 oz (110.6 kg)   LMP 10/25/2023 (Exact Date)   SpO2 97%   BMI 36.00 kg/m    Physical Exam Vitals and nursing note reviewed.  Constitutional:      Appearance: Normal appearance. She is obese.  HENT:     Head: Normocephalic and atraumatic.  Cardiovascular:     Rate and Rhythm: Normal rate and regular rhythm.     Pulses: Normal pulses.     Heart sounds: Normal heart sounds.  Pulmonary:     Effort: Pulmonary effort is normal.     Breath sounds: Normal breath sounds.  Skin:    General: Skin is warm and dry.  Neurological:     General: No focal deficit present.     Mental Status: She is alert and oriented to person, place, and time. Mental status is at baseline.  Psychiatric:        Mood and Affect: Mood normal.        Behavior: Behavior normal.        Thought Content: Thought content normal.        Judgment: Judgment normal.       No results found for any visits on 11/23/23.     Assessment & Plan:   Problem List Items Addressed This Visit     Essential hypertension, benign   Gastroesophageal  reflux disease without esophagitis   Uncomplicated asthma   Morbid obesity (HCC) - Primary   Relevant Orders   Amb Ref to Medical Weight Management   Lipid panel   VITAMIN D  25 Hydroxy (Vit-D Deficiency, Fractures)   Other Visit Diagnoses       Encounter for vitamin deficiency screening       Relevant Orders   VITAMIN D  25 Hydroxy (Vit-D Deficiency, Fractures)     Screening for lipid disorders       Relevant Orders   Lipid panel       Assessment and Plan Assessment &  Plan Morbid obesity due to excess calories Weight gain post-Tirzepatide  discontinuation. Mood swings, sleep, and concentration issues noted. No significant dietary changes, lacks regular exercise. - Referred to medical weight management. - Advised to monitor caloric intake and increase physical activity.  Essential hypertension Controlled with Losartan  100 mg  Gastroesophageal reflux disease Diet-controlled, no recent concerning symptoms.  Chronic constipation Improved with Miralax . - Continue Miralax  as needed.  Migraine Intermittent migraines, recent episode without vomiting or neurological symptoms.  Difficulty concentrating, possible ADHD Concentration issues affecting academics, no prior ADHD diagnosis. - Provided list of providers for ADHD testing. - Discussed potential for ADHD testing before considering stimulant medications.    No orders of the defined types were placed in this encounter.   Return in about 6 months (around 05/22/2024) for annual physical with labs 1 week prior.  Jeoffrey GORMAN Barrio, FNP [facility]

## 2023-11-25 DIAGNOSIS — Z419 Encounter for procedure for purposes other than remedying health state, unspecified: Secondary | ICD-10-CM | POA: Diagnosis not present

## 2023-11-26 ENCOUNTER — Encounter (INDEPENDENT_AMBULATORY_CARE_PROVIDER_SITE_OTHER): Payer: Self-pay

## 2023-11-26 LAB — LIPID PANEL
Cholesterol: 158 mg/dL (ref <200)
HDL: 60 mg/dL (ref >=50)
LDL Cholesterol (Calc): 81 mg/dL
Non-HDL Cholesterol (Calc): 98 mg/dL (ref <130)
Total CHOL/HDL Ratio: 2.6 (calc) (ref <5.0)
Triglycerides: 86 mg/dL (ref <150)

## 2023-11-26 LAB — VITAMIN D 25 HYDROXY (VIT D DEFICIENCY, FRACTURES): Vit D, 25-Hydroxy: 26 ng/mL — ABNORMAL LOW (ref 30–100)

## 2023-11-27 ENCOUNTER — Ambulatory Visit: Payer: Self-pay | Admitting: Family Medicine

## 2023-11-29 ENCOUNTER — Emergency Department (HOSPITAL_COMMUNITY)
Admission: EM | Admit: 2023-11-29 | Discharge: 2023-11-29 | Disposition: A | Discharge status: Home / Self Care | Attending: Emergency Medicine | Admitting: Emergency Medicine

## 2023-11-29 ENCOUNTER — Encounter (HOSPITAL_COMMUNITY): Payer: Self-pay

## 2023-11-29 ENCOUNTER — Other Ambulatory Visit: Payer: Self-pay

## 2023-11-29 DIAGNOSIS — B349 Viral infection, unspecified: Secondary | ICD-10-CM | POA: Insufficient documentation

## 2023-11-29 DIAGNOSIS — R059 Cough, unspecified: Secondary | ICD-10-CM | POA: Diagnosis present

## 2023-11-29 DIAGNOSIS — R519 Headache, unspecified: Secondary | ICD-10-CM | POA: Diagnosis not present

## 2023-11-29 LAB — RESP PANEL BY RT-PCR (RSV, FLU A&B, COVID)  RVPGX2
Influenza A by PCR: NEGATIVE
Influenza B by PCR: NEGATIVE
Resp Syncytial Virus by PCR: NEGATIVE
SARS Coronavirus 2 by RT PCR: NEGATIVE

## 2023-11-29 LAB — GROUP A STREP BY PCR: Group A Strep by PCR: NOT DETECTED

## 2023-11-29 MED ORDER — DEXAMETHASONE SOD PHOSPHATE PF 10 MG/ML IJ SOLN
12.0000 mg | Freq: Once | INTRAMUSCULAR | Status: AC
  Administered 2023-11-29: 12 mg via INTRAMUSCULAR

## 2023-11-29 MED ORDER — ACETAMINOPHEN 500 MG PO TABS
1000.0000 mg | ORAL_TABLET | Freq: Once | ORAL | Status: AC
  Administered 2023-11-29: 1000 mg via ORAL
  Filled 2023-11-29: qty 2

## 2023-11-29 NOTE — ED Triage Notes (Signed)
 Pt c.o productive cough, congestion and sob x 1.5 weeks. Pt taking multiple OTC medications without relief. Fever at times. Pt c.o severe headache at this time

## 2023-11-29 NOTE — Discharge Instructions (Addendum)
 Evaluation today was overall reassuring. Suspect your symptoms are likely related to a viral illness.  As we discussed please treat supportively at home which primarily includes assertive rehydration with water and Gatorade, you can take Tylenol  for headache and fever relief.  Please follow-up your PCP if your symptoms persist.  If they worsen or change anyway please return to the ED for further evaluation.

## 2023-11-29 NOTE — ED Provider Notes (Signed)
 [facility] Provider Note   CSN: [id number] Arrival date & time: 11/29/23  9149     Patient presents with: Cough and Headache  HPI Janet [name] is a 38 y.o. female presenting for cough, nasal congestion and headache has been going on for about 8 days now.  Cough has been productive with green-yellow sputum.  She reports that all of her kids and her husband also had the same symptoms.  Endorses subjective fever at home and intermittent shortness of breath.  Denies chest pain and shortness of breath at this time.  Also states she has had a headache which has been intermittent.  She states that right now it has subsided somewhat.  Reports history of migraines and states that her headache symptoms are very similar to migraine she has had in the past.  Denies thunderclap symptoms.  Denies nuchal rigidity.  She reports allergy to NSAIDs.  But has been taking Tylenol  which has helped somewhat.    Cough Associated symptoms: headaches   Headache Associated symptoms: cough        Prior to Admission medications   Medication Sig Start Date End Date Taking? Authorizing Provider  albuterol  (VENTOLIN  HFA) 108 (90 Base) MCG/ACT inhaler Inhale 2 puffs into the lungs every 6 (six) hours as needed for wheezing or shortness of breath. 06/17/23   Kayla Jeoffrey RAMAN, FNP  Etonogestrel  (NEXPLANON  [place]) Inject into the skin.    [provider]  losartan  (COZAAR ) 100 MG tablet Take 1 tablet (100 mg total) by mouth daily. 06/17/23   Kayla Jeoffrey RAMAN, FNP  oxyCODONE  (ROXICODONE ) 5 MG immediate release tablet Take 1 tablet (5 mg total) by mouth every 8 (eight) hours as needed. 11/07/23 11/06/24  Waymond Lorelle Cummins, MD  polyethylene glycol (MIRALAX ) 17 g packet Take 17 g by mouth daily. 11/07/23   Waymond Lorelle Cummins, MD    Allergies: Ibuprofen, Nsaids, Ondansetron  hcl, Other, Levofloxacin in d5w, Metronidazole , and Tramadol     Review of Systems  Respiratory:  Positive for  cough.   Neurological:  Positive for headaches.    Updated Vital Signs BP (!) 160/112 (BP Location: Left Arm)   Pulse 79   Temp 98.6 F (37 C)   Resp 14   Ht 5' 9 (1.753 m)   Wt 110.5 kg   LMP 10/25/2023 (Exact Date)   SpO2 100%   BMI 35.97 kg/m   Physical Exam Vitals and nursing note reviewed.  HENT:     Head: Normocephalic and atraumatic.     Mouth/Throat:     Mouth: Mucous membranes are moist.  Eyes:     General:        Right eye: No discharge.        Left eye: No discharge.     Conjunctiva/sclera: Conjunctivae normal.  Cardiovascular:     Rate and Rhythm: Normal rate and regular rhythm.     Pulses: Normal pulses.     Heart sounds: Normal heart sounds.  Pulmonary:     Effort: Pulmonary effort is normal.     Breath sounds: Normal breath sounds and air entry. No decreased breath sounds, wheezing, rhonchi or rales.  Abdominal:     General: Abdomen is flat.     Palpations: Abdomen is soft.  Skin:    General: Skin is warm and dry.  Neurological:     General: No focal deficit present.     Comments: GCS 15. Speech is goal oriented. No deficits appreciated to CN  III-XII; symmetric eyebrow raise, no facial drooping, tongue midline. Patient has equal grip strength bilaterally with 5/5 strength against resistance in all major muscle groups bilaterally. Sensation to light touch intact. Patient moves extremities without ataxia. Normal finger-nose-finger. Patient ambulatory with steady gait.  Psychiatric:        Mood and Affect: Mood normal.     (all labs ordered are listed, but only abnormal results are displayed) Labs Reviewed  RESP PANEL BY RT-PCR (RSV, FLU A&B, COVID)  RVPGX2  GROUP A STREP BY PCR    EKG: None  Radiology: No results found.   Procedures   Medications Ordered in the ED  acetaminophen  (TYLENOL ) tablet 1,000 mg (1,000 mg Oral Given 11/29/23 1120)  dexamethasone  (DECADRON ) injection 12 mg (12 mg Intramuscular Given 11/29/23 1120)                                     Medical Decision Making Risk OTC drugs. Prescription drug management.   38 year old well-appearing female presenting for URI symptoms and a headache.  Exam was unremarkable and she looks well, nontoxic and vitals are reassuring.  Blood pressure was slightly elevated.  Suspect this is likely a viral process given that her family has the same symptoms at this time.  Gave her Tylenol  and Decadron  for her headache.  Advised supportive care for viral illness at home and to follow-up with her PCP.  Strep and respiratory PCR are negative.  Discussed return precautions.  Discharged.     Final diagnoses:  Viral illness    ED Discharge Orders     None          Lang Norleen POUR, PA-C 11/29/23 1202    Cottie Donnice PARAS, MD 11/29/23 [phone]

## 2023-12-09 DIAGNOSIS — R7611 Nonspecific reaction to tuberculin skin test without active tuberculosis: Secondary | ICD-10-CM | POA: Diagnosis not present

## 2023-12-17 ENCOUNTER — Other Ambulatory Visit (HOSPITAL_COMMUNITY): Payer: Self-pay

## 2023-12-28 ENCOUNTER — Ambulatory Visit: Payer: Self-pay

## 2023-12-28 NOTE — Telephone Encounter (Signed)
 FYI Only or Action Required?: FYI only for provider: appointment scheduled on 12/29/23.  Patient was last seen in primary care on 11/23/2023 by Kayla Jeoffrey RAMAN, FNP.  Called Nurse Triage reporting Hypertension.  Symptoms began several days ago.  Interventions attempted: Prescription medications: Losartan  and Rest, hydration, or home remedies.  Symptoms are: unchanged.  Triage Disposition: See Physician Within 24 Hours  Patient/caregiver understands and will follow disposition?: Yes   Copied from CRM #[id number]. Topic: Clinical - Red Word Triage >> Dec 28, 2023  2:28 PM Delon HERO wrote: Red Word that prompted transfer to Nurse Triage: Patient is calling to report her Blood pressure has been elevated with >110 diastolic. 160/125. With headaches daily Reason for Disposition  Systolic BP >= 180 OR Diastolic >= 110  Answer Assessment - Initial Assessment Questions 1. BLOOD PRESSURE: What is your blood pressure? Did you take at least two measurements 5 minutes apart?     167/111 9am 1pm 150/110 2. ONSET: When did you take your blood pressure?     Thursday 3. HOW: How did you take your blood pressure? (e.g., automatic home BP monitor, visiting nurse)     home 4. HISTORY: Do you have a history of high blood pressure?     yes 5. MEDICINES: Are you taking any medicines for blood pressure? Have you missed any doses recently?     As prescribed  6. OTHER SYMPTOMS: Do you have any symptoms? (e.g., blurred vision, chest pain, difficulty breathing, headache, weakness)     Headache in the morning-Tylenol , dizziness in morning  7. PREGNANCY: Is there any chance you are pregnant? When was your last menstrual period?  Protocols used: Blood Pressure - High-A-AH

## 2023-12-29 ENCOUNTER — Ambulatory Visit: Admitting: Family Medicine

## 2023-12-29 ENCOUNTER — Encounter: Payer: Self-pay | Admitting: Family Medicine

## 2023-12-29 VITALS — BP 150/90 | HR 73 | Temp 97.5°F | Ht 69.0 in | Wt 244.4 lb

## 2023-12-29 DIAGNOSIS — I10 Essential (primary) hypertension: Secondary | ICD-10-CM | POA: Diagnosis not present

## 2023-12-29 MED ORDER — HYDROCHLOROTHIAZIDE 12.5 MG PO TABS: 12.5000 mg | ORAL_TABLET | Freq: Every day | ORAL | 3 refills | Status: AC

## 2023-12-29 NOTE — Progress Notes (Signed)
 Acute Office Visit  Patient ID: Janet [name], female    DOB: [birth year], 38 y.o.   MRN: [id number]  PCP: Kayla Jeoffrey RAMAN, FNP  Chief Complaint  Patient presents with   Acute Visit    HTN exceedingly high pressure readings, w/ dizziness, exhaustion, weakness, headaches, since last Tuesday  She notices when she wakes in the morning her hands, feet and ankles are swollen and fingers are numb.      Subjective:     HPI  Discussed the use of AI scribe software for clinical note transcription with the patient, who gave verbal consent to proceed.  History of Present Illness Janet [name] is a 38 year old female with hypertension who presents with elevated blood pressure and associated symptoms.  She has consistently elevated blood pressure readings at home, with the highest being 161/114 mmHg and the lowest 135/110 mmHg. Her symptoms of dizziness, weakness, and headaches have been present since Tuesday and are associated with her elevated blood pressure. During a recent urgent care visit, her blood pressure was high but not addressed.  She is currently taking 100 mg of losartan  daily at night, which sometimes causes headaches. She was previously on amlodipine , which was discontinued. Her weight has increased from the 230s to 240s, which she feels may be contributing to her symptoms.  She experiences swelling in her fingers, feet, and ankles, particularly in the morning, along with numbness when she closes her hands. She frequently feels tired and weak. No chest pain or changes in urination. She denies any new medications or changes in her current regimen.  She has a history of knee and back pain, for which she takes Tylenol  and oxycodone . The oxycodone  causes drowsiness the following morning. She has been on light duty since a knee injury and was unable to complete a functional capacity evaluation due to high blood pressure. She needs to complete the evaluation to return to work.  She  reports vision changes when looking at her phone, which sometimes alters her vision. She also denies any new stressors, stating that stress has always been present.   Review of Systems  All other systems reviewed and are negative.   Past Medical History:  Diagnosis Date   Abdominal pain 05/06/2021   Anemia    Asthma    Bilateral knee pain 10/22/2020   Depression    Finger pain, left 11/26/2017   Gestational diabetes mellitus, antepartum 08/12/2016   Moved pt to BRX GDM program Growth US  at 38 week EFW 7#13oz, 82nd%   Left wrist pain 06/18/2020   Migraines    Right wrist pain 11/26/2017   Skin tag 05/11/2020    Past Surgical History:  Procedure Laterality Date   CESAREAN SECTION N/A 10/17/2016   Procedure: REPEAT CESAREAN SECTION;  Surgeon: Eveline Lynwood MATSU, MD;  Location: [facility] BIRTHING SUITES;  Service: Obstetrics;  Laterality: N/A;   CESAREAN SECTION     DILATION AND CURETTAGE OF UTERUS      Medications Ordered Prior to Encounter[1]  Allergies[2]     Objective:    BP (!) 150/90   Pulse 73   Temp (!) 97.5 F (36.4 C)   Ht 5' 9 (1.753 m)   Wt 244 lb 6.4 oz (110.9 kg)   LMP  (Approximate)   SpO2 98%   BMI 36.09 kg/m  BP Readings from Last 3 Encounters:  12/29/23 (!) 150/90  11/29/23 (!) 162/105  11/23/23 (!) 141/99   Wt Readings from Last 3 Encounters:  12/29/23  244 lb 6.4 oz (110.9 kg)  11/29/23 243 lb 9.7 oz (110.5 kg)  11/23/23 243 lb 12.8 oz (110.6 kg)      Physical Exam Vitals and nursing note reviewed.  Constitutional:      Appearance: Normal appearance. She is normal weight.  HENT:     Head: Normocephalic and atraumatic.  Cardiovascular:     Rate and Rhythm: Normal rate and regular rhythm.     Pulses: Normal pulses.     Heart sounds: Normal heart sounds.  Pulmonary:     Effort: Pulmonary effort is normal.     Breath sounds: Normal breath sounds.  Skin:    General: Skin is warm and dry.  Neurological:     General: No focal deficit present.      Mental Status: She is alert and oriented to person, place, and time. Mental status is at baseline.  Psychiatric:        Mood and Affect: Mood normal.        Behavior: Behavior normal.        Thought Content: Thought content normal.        Judgment: Judgment normal.       No results found for any visits on 12/29/23.     Assessment & Plan:   Problem List Items Addressed This Visit       Cardiovascular and Mediastinum   Essential hypertension, benign - Primary   Relevant Medications   hydrochlorothiazide  (HYDRODIURIL ) 12.5 MG tablet   Other Relevant Orders   Comprehensive metabolic panel with GFR   Urinalysis, Routine w reflex microscopic   EKG 12-Lead    Assessment and Plan Assessment & Plan Essential hypertension Hypertension uncontrolled with home readings 135/110 to 169/115. Symptoms include dizziness, weakness, headaches, and peripheral edema. Losartan  may contribute to headaches. Stress and pain may affect blood pressure. - Started hydrochlorothiazide  12.5 mg daily, increase to 25 mg if no improvement in two weeks. - Ordered EKG and blood work to assess kidney function and overall health. - NSR - Monitor blood pressure and symptoms closely.    Meds ordered this encounter  Medications   hydrochlorothiazide  (HYDRODIURIL ) 12.5 MG tablet    Sig: Take 1 tablet (12.5 mg total) by mouth daily.    Dispense:  90 tablet    Refill:  3    Supervising Provider:   DUANNE LOWERS T [[id number]]    Return in about 4 weeks (around 01/26/2024) for chronic follow-up with labs 1 week prior.  Jeoffrey GORMAN Barrio, FNP [facility]      [1]  Current Outpatient Medications on File Prior to Visit  Medication Sig Dispense Refill   albuterol  (VENTOLIN  HFA) 108 (90 Base) MCG/ACT inhaler Inhale 2 puffs into the lungs every 6 (six) hours as needed for wheezing or shortness of breath. 8 g 2   Etonogestrel  (NEXPLANON  [place]) Inject into the skin.     losartan   (COZAAR ) 100 MG tablet Take 1 tablet (100 mg total) by mouth daily. 90 tablet 1   oxyCODONE  (ROXICODONE ) 5 MG immediate release tablet Take 1 tablet (5 mg total) by mouth every 8 (eight) hours as needed. (Patient not taking: Reported on 12/29/2023) 12 tablet 0   polyethylene glycol (MIRALAX ) 17 g packet Take 17 g by mouth daily. 30 packet 0   No current facility-administered medications on file prior to visit.  [2]  Allergies Allergen Reactions   Ibuprofen Swelling    Facial swelling   Nsaids Other (See Comments)  Stomach ulcers   Ondansetron  Hcl Other (See Comments)    Pt states it made me crazy  Pt states it made me crazy    Pt states it made me crazy Pt states it made me crazy   Other Other (See Comments)    Other reaction(s): Other (See Comments)  Stomach ulcers  Other reaction(s): Other (See Comments) Stomach ulcers    Other reaction(s): Other (See Comments) Stomach ulcers   Levofloxacin In D5w Rash and Itching   Metronidazole  Itching   Tramadol  Itching

## 2023-12-29 NOTE — Progress Notes (Deleted)
 Referring Physician:  Kayla Jeoffrey RAMAN, FNP [address] [place],  [place] [address]  Primary Physician:  Kayla Jeoffrey RAMAN, FNP  History of Present Illness: 12/29/2023 Janet [name] is here today with a chief complaint of ***  Spinal stenosis  Acute low back pain with sciatica.  Pain radiating to hips and legs.   Duration: *** Location: *** Quality: *** Severity: ***  Precipitating: aggravated by *** Modifying factors: made better by *** Weakness: none Timing: *** Bowel/Bladder Dysfunction: none  Conservative measures:  Physical therapy: *** Has not participated in? Multimodal medical therapy including regular antiinflammatories: *** Tylenol , Lidoderm   Injections: *** No epidural steroid injections?  Past Surgery: ***  Janet [name] has ***no symptoms of cervical myelopathy.  The symptoms are causing a significant impact on the patient's life.   Review of Systems:  A 10 point review of systems is negative, except for the pertinent positives and negatives detailed in the HPI.  Past Medical History: Past Medical History:  Diagnosis Date   Abdominal pain 05/06/2021   Anemia    Asthma    Bilateral knee pain 10/22/2020   Depression    Finger pain, left 11/26/2017   Gestational diabetes mellitus, antepartum 08/12/2016   Moved pt to BRX GDM program Growth US  at 38 week EFW 7#13oz, 82nd%   Left wrist pain 06/18/2020   Migraines    Right wrist pain 11/26/2017   Skin tag 05/11/2020    Past Surgical History: Past Surgical History:  Procedure Laterality Date   CESAREAN SECTION N/A 10/17/2016   Procedure: REPEAT CESAREAN SECTION;  Surgeon: Eveline Lynwood MATSU, MD;  Location: [facility] BIRTHING SUITES;  Service: Obstetrics;  Laterality: N/A;   CESAREAN SECTION     DILATION AND CURETTAGE OF UTERUS      Allergies: Allergies as of 01/04/2024 - Review Complete 11/29/2023  Allergen Reaction Noted   Ibuprofen Swelling 06/15/2019   Nsaids Other (See Comments) 08/01/2015    Ondansetron  hcl Other (See Comments) 07/18/2016   Other Other (See Comments) 08/01/2015   Levofloxacin in d5w Rash and Itching 08/01/2015   Metronidazole  Itching 06/27/2016   Tramadol  Itching 10/25/2021    Medications: Outpatient Encounter Medications as of 01/04/2024  Medication Sig   albuterol  (VENTOLIN  HFA) 108 (90 Base) MCG/ACT inhaler Inhale 2 puffs into the lungs every 6 (six) hours as needed for wheezing or shortness of breath.   Etonogestrel  (NEXPLANON  [place]) Inject into the skin.   losartan  (COZAAR ) 100 MG tablet Take 1 tablet (100 mg total) by mouth daily.   oxyCODONE  (ROXICODONE ) 5 MG immediate release tablet Take 1 tablet (5 mg total) by mouth every 8 (eight) hours as needed.   polyethylene glycol (MIRALAX ) 17 g packet Take 17 g by mouth daily.   No facility-administered encounter medications on file as of 01/04/2024.    Social History: Social History[1]  Family Medical History: Family History  Problem Relation Age of Onset   Hypertension Mother    Heart disease Mother    Diabetes Father    Breast cancer Maternal Aunt 70   Sleep apnea Neg Hx     Physical Examination: @VITALWITHPAIN @  General: Patient is well developed, well nourished, calm, collected, and in no apparent distress. Attention to examination is appropriate.  Psychiatric: Patient is non-anxious.  Head:  Pupils equal, round, and reactive to light.  ENT:  Oral mucosa appears well hydrated.  Neck:   Supple.  ***Full range of motion.  Respiratory: Patient is breathing without any difficulty.  Extremities: No edema.  Vascular: Palpable dorsal pedal pulses.  Skin:   On exposed skin, there are no abnormal skin lesions.  NEUROLOGICAL:     Awake, alert, oriented to person, place, and time.  Speech is clear and fluent. Fund of knowledge is appropriate.   Cranial Nerves: Pupils equal round and reactive to light.  Facial tone is symmetric.  Facial sensation is symmetric.  ROM of spine: ***full.   Palpation of spine: ***non tender.    Strength: Side Biceps Triceps Deltoid Interossei Grip Wrist Ext. Wrist Flex.  R 5 5 5 5 5 5 5   L 5 5 5 5 5 5 5    Side Iliopsoas Quads Hamstring PF DF EHL  R 5 5 5 5 5 5   L 5 5 5 5 5 5    Reflexes are ***2+ and symmetric at the biceps, triceps, brachioradialis, patella and achilles.   Hoffman's is absent.  Clonus is not present.  Toes are down-going.  Bilateral upper and lower extremity sensation is intact to light touch.    Gait is normal.   No difficulty with tandem gait.   No evidence of dysmetria noted.  Medical Decision Making  Imaging: ***  I have personally reviewed the images and agree with the above interpretation.  Assessment and Plan: Janet [name] is a pleasant 38 y.o. female with ***    Thank you for involving me in the care of this patient.   I spent a total of *** minutes in both face-to-face and non-face-to-face activities for this visit on the date of this encounter.   Lyle Decamp, PA-C Dept. of Neurosurgery     [1]  Social History Tobacco Use   Smoking status: Never   Smokeless tobacco: Never  Vaping Use   Vaping status: Never Used  Substance Use Topics   Alcohol use: Not Currently    Comment: once every few months   Drug use: No

## 2023-12-30 ENCOUNTER — Other Ambulatory Visit

## 2023-12-30 DIAGNOSIS — I10 Essential (primary) hypertension: Secondary | ICD-10-CM | POA: Diagnosis not present

## 2023-12-31 LAB — URINALYSIS, ROUTINE W REFLEX MICROSCOPIC
Bilirubin Urine: NEGATIVE
Glucose, UA: NEGATIVE
Hgb urine dipstick: NEGATIVE
Ketones, ur: NEGATIVE
Leukocytes,Ua: NEGATIVE
Nitrite: NEGATIVE
Protein, ur: NEGATIVE
Specific Gravity, Urine: 1.016 (ref 1.001–1.035)
pH: 7.5 (ref 5.0–8.0)

## 2023-12-31 LAB — COMPREHENSIVE METABOLIC PANEL WITH GFR
AG Ratio: 2 (calc) (ref 1.0–2.5)
ALT: 20 U/L (ref 6–29)
AST: 19 U/L (ref 10–30)
Albumin: 4.7 g/dL (ref 3.6–5.1)
Alkaline phosphatase (APISO): 49 U/L (ref 31–125)
BUN: 10 mg/dL (ref 7–25)
CO2: 27 mmol/L (ref 20–32)
Calcium: 9.2 mg/dL (ref 8.6–10.2)
Chloride: 105 mmol/L (ref 98–110)
Creat: 0.95 mg/dL (ref 0.50–0.97)
Globulin: 2.4 g/dL (ref 1.9–3.7)
Glucose, Bld: 103 mg/dL — ABNORMAL HIGH (ref 65–99)
Potassium: 4.3 mmol/L (ref 3.5–5.3)
Sodium: 138 mmol/L (ref 135–146)
Total Bilirubin: 0.9 mg/dL (ref 0.2–1.2)
Total Protein: 7.1 g/dL (ref 6.1–8.1)
eGFR: 78 mL/min/1.73m2 (ref >=60)

## 2023-12-31 LAB — EXTRA LAV TOP TUBE

## 2024-01-04 ENCOUNTER — Ambulatory Visit: Admitting: Physician Assistant

## 2024-01-18 ENCOUNTER — Ambulatory Visit: Admitting: Family Medicine

## 2024-01-19 ENCOUNTER — Encounter: Payer: Self-pay | Admitting: Family Medicine

## 2024-01-19 ENCOUNTER — Ambulatory Visit: Admitting: Family Medicine

## 2024-01-19 VITALS — BP 122/89 | HR 65 | Ht 69.0 in | Wt 248.0 lb

## 2024-01-19 DIAGNOSIS — M25561 Pain in right knee: Secondary | ICD-10-CM | POA: Diagnosis not present

## 2024-01-19 DIAGNOSIS — M545 Low back pain, unspecified: Secondary | ICD-10-CM

## 2024-01-19 DIAGNOSIS — I10 Essential (primary) hypertension: Secondary | ICD-10-CM | POA: Diagnosis not present

## 2024-01-19 DIAGNOSIS — G8929 Other chronic pain: Secondary | ICD-10-CM | POA: Diagnosis not present

## 2024-01-19 NOTE — Progress Notes (Signed)
 "  Acute Office Visit  Patient ID: Janet [name], female    DOB: [birth year], 39 y.o.   MRN: [id number]  PCP: Kayla Jeoffrey RAMAN, FNP  Chief Complaint  Patient presents with   Medical Management of Chronic Issues    2 wk f/u clearance letter  Blurry vision  Wants to speak about formulary exception for Semaglutide       Subjective:     HPI  Discussed the use of AI scribe software for clinical note transcription with the patient, who gave verbal consent to proceed.  History of Present Illness Janet [name] is a 39 year old female who presents for clearance to undergo a functional capacity exam for a right knee injury.  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 She is considering options between surgery and permanent restriction, preferring to avoid surgery as it may not guarantee resolution of the problem.  Her blood pressure has improved since starting a new medication, with recent home readings at 122/89 mmHg. She regularly checks her blood pressure but did not bring the records as she was coming from school. She notes better control when she was on terzepatide, which also helped with weight management.  She reports tightness in her back, especially when sitting and then trying to get up, and has an upcoming neurosurgery appointment. No chest pain, shortness of breath, or headaches.   Review of Systems  All other systems reviewed and are negative.   Past Medical History:  Diagnosis Date   Abdominal pain 05/06/2021   Anemia    Asthma    Bilateral knee pain 10/22/2020   Depression    Finger pain, left 11/26/2017   Gestational diabetes mellitus, antepartum 08/12/2016   Moved pt to [facility]X GDM program Growth US  at 38 week EFW 7#13oz, 82nd%   Left wrist pain 06/18/2020   Migraines    Right wrist pain 11/26/2017   Skin tag 05/11/2020    Past Surgical History:  Procedure Laterality Date    CESAREAN SECTION N/A 10/17/2016   Procedure: REPEAT CESAREAN SECTION;  Surgeon: Eveline Lynwood MATSU, MD;  Location: [facility] BIRTHING SUITES;  Service: Obstetrics;  Laterality: N/A;   CESAREAN SECTION     DILATION AND CURETTAGE OF UTERUS      Outpatient Medications Prior to Visit  Medication Sig Dispense Refill   albuterol  (VENTOLIN  HFA) 108 (90 Base) MCG/ACT inhaler Inhale 2 puffs into the lungs every 6 (six) hours as needed for wheezing or shortness of breath. 8 g 2   Etonogestrel  (NEXPLANON  [place]) Inject into the skin.     hydrochlorothiazide  (HYDRODIURIL ) 12.5 MG tablet Take 1 tablet (12.5 mg total) by mouth daily. 90 tablet 3   losartan  (COZAAR ) 100 MG tablet Take 1 tablet (100 mg total) by mouth daily. 90 tablet 1   polyethylene glycol (MIRALAX ) 17 g packet Take 17 g by mouth daily. 30 packet 0   oxyCODONE  (ROXICODONE ) 5 MG immediate release tablet Take 1 tablet (5 mg total) by mouth every 8 (eight) hours as needed. (Patient not taking: Reported on 01/19/2024) 12 tablet 0   No facility-administered medications prior to visit.    Allergies[1]     Objective:    BP 122/89 Comment: lft arm  Pulse 65   Ht 5' 9 (1.753 m)   Wt 248 lb (112.5 kg)   LMP  (Approximate)   SpO2 99%   BMI 36.62 kg/m  BP Readings from  Last 3 Encounters:  01/19/24 122/89  12/29/23 (!) 150/90  11/29/23 (!) 162/105   Wt Readings from Last 3 Encounters:  01/19/24 248 lb (112.5 kg)  12/29/23 244 lb 6.4 oz (110.9 kg)  11/29/23 243 lb 9.7 oz (110.5 kg)      Physical Exam Vitals and nursing note reviewed.  Constitutional:      Appearance: Normal appearance. She is obese.  HENT:     Head: Normocephalic and atraumatic.  Cardiovascular:     Rate and Rhythm: Normal rate and regular rhythm.     Pulses: Normal pulses.     Heart sounds: Normal heart sounds.  Pulmonary:     Effort: Pulmonary effort is normal.     Breath sounds: Normal breath sounds.  Skin:    General: Skin is warm and dry.  Neurological:      General: No focal deficit present.     Mental Status: She is alert and oriented to person, place, and time. Mental status is at baseline.  Psychiatric:        Mood and Affect: Mood normal.        Behavior: Behavior normal.        Thought Content: Thought content normal.        Judgment: Judgment normal.       No results found for any visits on 01/19/24.     Assessment & Plan:   Problem List Items Addressed This Visit       Cardiovascular and Mediastinum   Essential hypertension, benign - Primary     Other   Chronic bilateral low back pain without sciatica   Right knee pain    Assessment and Plan Assessment & Plan Right knee injury with pain Orthopedic evaluation indicated surgery is not a guaranteed solution. - Proceed with functional capacity exam to assess work restrictions.  Essential hypertension Blood pressure well-controlled with current medication. Occasional dizziness and blurry vision possibly due to fatigue. - Continue current antihypertensive medication regimen. - Provided note for functional capacity exam clearance.  Back pain Neurosurgery appointments scheduled for further evaluation. - Attend scheduled neurosurgery appointments.    No orders of the defined types were placed in this encounter.   Return in about 5 months (around 06/18/2024) for annual physical with labs 1 week prior.  Jeoffrey GORMAN Barrio, FNP [facility]      [1]  Allergies Allergen Reactions   Ibuprofen Swelling and Dermatitis    Facial swelling   Ondansetron  Hcl Other (See Comments)    Pt states it made me crazy  Pt states it made me crazy    Pt states it made me crazy Pt states it made me crazy   Other Other (See Comments)    Other reaction(s): Other (See Comments)  Stomach ulcers  Other reaction(s): Other (See Comments) Stomach ulcers    Other reaction(s): Other (See Comments) Stomach ulcers   Levofloxacin In D5w Rash and Itching    Metronidazole  Itching and Dermatitis   Nsaids Other (See Comments) and Rash    Stomach ulcers   Tramadol  Itching and Dermatitis   "

## 2024-01-25 NOTE — Progress Notes (Unsigned)
 "  Referring Physician:  Kayla Jeoffrey RAMAN, FNP [address] [place],  [place] [address]  Primary Physician:  Kayla Jeoffrey RAMAN, FNP  History of Present Illness: 01/27/2024 Ms. Janet [name] is here today with a chief complaint of low back pain that radiates into her buttock and down the back of her legs and stops at the level of her knees.  When she goes from sitting to standing she has an increase in pain and begins to feel a bit better when she starts walking.  This has been longstanding, but worse in the last 3 months.  She can still walk a good distance, but her legs feel heavy.  1 leg is not worse than the other.  She does find herself having to lean forward to get relief.  No saddle anesthesia or incontinence to bowel or bladder.      Bowel/Bladder Dysfunction: none  Conservative measures:  Physical therapy:  Has not participated in. Multimodal medical therapy including regular antiinflammatories:  Tylenol , Lidoderm   Injections:  No epidural steroid injections.   Janet [name] has no symptoms of cervical myelopathy.  The symptoms are causing a significant impact on the patient's life.   Review of Systems:  A 10 point review of systems is negative, except for the pertinent positives and negatives detailed in the HPI.  Past Medical History: Past Medical History:  Diagnosis Date   Abdominal pain 05/06/2021   Anemia    Asthma    Bilateral knee pain 10/22/2020   Depression    Finger pain, left 11/26/2017   Gestational diabetes mellitus, antepartum 08/12/2016   Moved pt to [facility]X GDM program Growth US  at 38 week EFW 7#13oz, 82nd%   Left wrist pain 06/18/2020   Migraines    Right wrist pain 11/26/2017   Skin tag 05/11/2020    Past Surgical History: Past Surgical History:  Procedure Laterality Date   CESAREAN SECTION N/A 10/17/2016   Procedure: REPEAT CESAREAN SECTION;  Surgeon: Eveline Lynwood MATSU, MD;  Location: [facility] BIRTHING SUITES;  Service: Obstetrics;  Laterality: N/A;    CESAREAN SECTION     DILATION AND CURETTAGE OF UTERUS      Allergies: Allergies as of 01/27/2024 - Review Complete 01/27/2024  Allergen Reaction Noted   Ibuprofen Swelling and Dermatitis 06/15/2019   Ondansetron  hcl Other (See Comments) 07/18/2016   Other Other (See Comments) 08/01/2015   Levofloxacin in d5w Rash and Itching 08/01/2015   Metronidazole  Itching and Dermatitis 06/27/2016   Nsaids Other (See Comments) and Rash 08/01/2015   Tramadol  Itching and Dermatitis 10/25/2021    Medications: Outpatient Encounter Medications as of 01/27/2024  Medication Sig   albuterol  (VENTOLIN  HFA) 108 (90 Base) MCG/ACT inhaler Inhale 2 puffs into the lungs every 6 (six) hours as needed for wheezing or shortness of breath.   Etonogestrel  (NEXPLANON  [place]) Inject into the skin.   hydrochlorothiazide  (HYDRODIURIL ) 12.5 MG tablet Take 1 tablet (12.5 mg total) by mouth daily.   losartan  (COZAAR ) 100 MG tablet Take 1 tablet (100 mg total) by mouth daily.   polyethylene glycol (MIRALAX ) 17 g packet Take 17 g by mouth daily.   [DISCONTINUED] oxyCODONE  (ROXICODONE ) 5 MG immediate release tablet Take 1 tablet (5 mg total) by mouth every 8 (eight) hours as needed. (Patient not taking: Reported on 01/19/2024)   No facility-administered encounter medications on file as of 01/27/2024.    Social History: Social History[1]  Family Medical History: Family History  Problem Relation Age of Onset   Hypertension  Mother    Heart disease Mother    Diabetes Father    Breast cancer Maternal Aunt 32   Sleep apnea Neg Hx     Physical Examination: @VITALWITHPAIN @  General: Patient is well developed, well nourished, calm, collected, and in no apparent distress. Attention to examination is appropriate.  Psychiatric: Patient is non-anxious.  Head:  Pupils equal, round, and reactive to light.  ENT:  Oral mucosa appears well hydrated.  Neck:   Supple.  Full range of motion.  Respiratory: Patient is breathing  without any difficulty.  Extremities: No edema.  Vascular: Palpable dorsal pedal pulses.  Skin:   On exposed skin, there are no abnormal skin lesions.  NEUROLOGICAL:     Awake, alert, oriented to person, place, and time.  Speech is clear and fluent. Fund of knowledge is appropriate.   Cranial Nerves: Pupils equal round and reactive to light.  Facial tone is symmetric.   ROM of spine: Does have some tenderness palpation of lumbar paraspinals.    Strength: Patient is at least a 4/5 strength in bilateral lower extremities.  Some giveaway weakness is present.  Some difficulty with straight leg raise on the right lower extremity.  Patient deferred patellar reflex on the right, trace reflexes otherwise in bilateral lower extremities.  No difficulty with tandem gait.   Medical Decision Making  Imaging: EXAM: CT LUMBAR SPINE WITHOUT CONTRAST   TECHNIQUE: Multidetector CT imaging of the lumbar spine was performed without intravenous contrast administration. Multiplanar CT image reconstructions were also generated.   RADIATION DOSE REDUCTION: This exam was performed according to the departmental dose-optimization program which includes automated exposure control, adjustment of the mA and/or kV according to patient size and/or use of iterative reconstruction technique.   COMPARISON:  None Available.   FINDINGS: Segmentation: 5 lumbar type vertebrae.   Alignment: Normal.   Vertebrae: No acute fracture is seen. Sclerosis is noted at the sacroiliac joints bilaterally, compatible with sacroiliitis.   Paraspinal and other soft tissues: No acute paraspinal abnormality. A trace amount of free fluid is noted in the pelvis on the left. No renal calculus or hydronephrosis bilaterally. Appendix appears normal.   Disc levels: Mild degenerative endplate changes, facet arthropathy, and ligamentum flavum thickening predominantly in the lower lumbar spine. There is moderate to severe  spinal canal stenosis at L4-L5 with mild neural foraminal stenosis bilaterally.   IMPRESSION: 1. 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 2. Findings compatible with sacroiliitis.    I have personally reviewed the images and agree with the above interpretation.  Assessment and Plan: Ms. Janet [name] is a pleasant 39 y.o. female with chronic back pain, that has become worse over the past 3 months.  Extends from her low back into bilateral buttock and down the back of her leg and stops at the level of her knees.  Her legs feel heavy and she often finds her self having to lean forward to get relief.  A CT of her lumbar spine it did show moderate to severe spinal stenosis at L4-5 which very  likely the cause of her pain.  Plan includes the following moving forward:  -X-ray of lumbar spine to include flexion-extension to evaluate for listhesis - MRI of lumbar spine to better visualization of stenosis and lumbar spine. - Referral placed for physical therapy - Advised patient to increase Tylenol  up to 4000 mg a day.  We did discuss other  medications, but patient would like to hold off at this time and continue as conservative management as possible. - Referral to our pain team for possible injection was also placed. - Plan to see back in approximately 8 weeks.  Thank you for involving me in the care of this patient.    Lyle Decamp, PA-C Dept. of Neurosurgery      [1]  Social History Tobacco Use   Smoking status: Never   Smokeless tobacco: Never  Vaping Use   Vaping status: Never Used  Substance Use Topics   Alcohol use: Not Currently    Comment: once every few months   Drug use: No   "

## 2024-01-27 ENCOUNTER — Encounter: Payer: Self-pay | Admitting: Physician Assistant

## 2024-01-27 ENCOUNTER — Ambulatory Visit

## 2024-01-27 ENCOUNTER — Ambulatory Visit (INDEPENDENT_AMBULATORY_CARE_PROVIDER_SITE_OTHER): Admitting: Physician Assistant

## 2024-01-27 VITALS — BP 120/80 | Ht 69.0 in | Wt 251.4 lb

## 2024-01-27 DIAGNOSIS — M5416 Radiculopathy, lumbar region: Secondary | ICD-10-CM

## 2024-01-27 DIAGNOSIS — M48061 Spinal stenosis, lumbar region without neurogenic claudication: Secondary | ICD-10-CM

## 2024-01-27 DIAGNOSIS — M48062 Spinal stenosis, lumbar region with neurogenic claudication: Secondary | ICD-10-CM

## 2024-01-27 DIAGNOSIS — G8929 Other chronic pain: Secondary | ICD-10-CM

## 2024-01-27 DIAGNOSIS — M5440 Lumbago with sciatica, unspecified side: Secondary | ICD-10-CM | POA: Diagnosis not present

## 2024-01-28 ENCOUNTER — Encounter: Payer: Self-pay | Admitting: Neurosurgery

## 2024-01-28 ENCOUNTER — Telehealth: Payer: Self-pay

## 2024-01-28 NOTE — Telephone Encounter (Signed)
 Copied from CRM [id number]. Topic: Clinical - Medication Question >> Jan 28, 2024  2:15 PM Pinkey ORN wrote: Reason for CRM: Medication Question >> Jan 28, 2024  2:17 PM Pinkey ORN wrote: Patient called, requesting an office call from Kayla Jeoffrey RAMAN, FNP. States they previously discussed a medication and patient is wanting to know if it's been called into the pharmacy or if it'll be prescribed.

## 2024-02-03 ENCOUNTER — Ambulatory Visit: Admitting: Family Medicine

## 2024-02-07 ENCOUNTER — Other Ambulatory Visit

## 2024-02-16 ENCOUNTER — Telehealth: Payer: Self-pay

## 2024-02-16 ENCOUNTER — Other Ambulatory Visit (HOSPITAL_COMMUNITY): Payer: Self-pay

## 2024-02-16 ENCOUNTER — Other Ambulatory Visit: Payer: Self-pay | Admitting: Family Medicine

## 2024-02-16 MED ORDER — ZEPBOUND 2.5 MG/0.5ML ~~LOC~~ SOAJ: 2.5000 mg | SUBCUTANEOUS | 0 refills | Status: AC

## 2024-02-17 NOTE — Telephone Encounter (Signed)
Please see previous PA encounter.

## 2024-03-10 ENCOUNTER — Other Ambulatory Visit
